# Patient Record
Sex: Female | Born: 1935 | Race: White | Hispanic: No | Marital: Married | Smoking: Never smoker
Health system: Southern US, Community
[De-identification: ages and names within clinical notes are randomized; demographics above are authoritative.]

## PROBLEM LIST (undated history)

## (undated) DIAGNOSIS — R011 Cardiac murmur, unspecified: Secondary | ICD-10-CM

## (undated) DIAGNOSIS — K219 Gastro-esophageal reflux disease without esophagitis: Secondary | ICD-10-CM

## (undated) DIAGNOSIS — M1990 Unspecified osteoarthritis, unspecified site: Secondary | ICD-10-CM

## (undated) DIAGNOSIS — I10 Essential (primary) hypertension: Secondary | ICD-10-CM

## (undated) DIAGNOSIS — K8020 Calculus of gallbladder without cholecystitis without obstruction: Secondary | ICD-10-CM

## (undated) DIAGNOSIS — E785 Hyperlipidemia, unspecified: Secondary | ICD-10-CM

## (undated) DIAGNOSIS — E119 Type 2 diabetes mellitus without complications: Secondary | ICD-10-CM

## (undated) DIAGNOSIS — D509 Iron deficiency anemia, unspecified: Secondary | ICD-10-CM

---

## 1999-07-18 ENCOUNTER — Other Ambulatory Visit: Admission: RE | Admit: 1999-07-18 | Discharge: 1999-07-18 | Payer: Self-pay | Admitting: *Deleted

## 2002-04-24 ENCOUNTER — Ambulatory Visit (HOSPITAL_COMMUNITY): Admission: RE | Admit: 2002-04-24 | Discharge: 2002-04-24 | Payer: Self-pay | Admitting: Gastroenterology

## 2003-01-28 ENCOUNTER — Encounter: Admission: RE | Admit: 2003-01-28 | Discharge: 2003-01-28 | Payer: Self-pay | Admitting: Family Medicine

## 2003-02-05 ENCOUNTER — Encounter: Admission: RE | Admit: 2003-02-05 | Discharge: 2003-02-05 | Payer: Self-pay | Admitting: Family Medicine

## 2003-09-22 ENCOUNTER — Encounter: Admission: RE | Admit: 2003-09-22 | Discharge: 2003-09-22 | Payer: Self-pay | Admitting: Family Medicine

## 2007-08-19 ENCOUNTER — Encounter: Admission: RE | Admit: 2007-08-19 | Discharge: 2007-08-19 | Payer: Self-pay | Admitting: Family Medicine

## 2008-06-09 ENCOUNTER — Encounter: Admission: RE | Admit: 2008-06-09 | Discharge: 2008-06-09 | Payer: Self-pay | Admitting: Family Medicine

## 2011-03-02 DIAGNOSIS — M810 Age-related osteoporosis without current pathological fracture: Secondary | ICD-10-CM | POA: Diagnosis not present

## 2011-05-02 DIAGNOSIS — Z1231 Encounter for screening mammogram for malignant neoplasm of breast: Secondary | ICD-10-CM | POA: Diagnosis not present

## 2011-05-08 DIAGNOSIS — E7800 Pure hypercholesterolemia, unspecified: Secondary | ICD-10-CM | POA: Diagnosis not present

## 2011-08-10 DIAGNOSIS — R011 Cardiac murmur, unspecified: Secondary | ICD-10-CM | POA: Diagnosis not present

## 2011-08-10 DIAGNOSIS — R809 Proteinuria, unspecified: Secondary | ICD-10-CM | POA: Diagnosis not present

## 2011-08-10 DIAGNOSIS — N318 Other neuromuscular dysfunction of bladder: Secondary | ICD-10-CM | POA: Diagnosis not present

## 2011-08-10 DIAGNOSIS — E1129 Type 2 diabetes mellitus with other diabetic kidney complication: Secondary | ICD-10-CM | POA: Diagnosis not present

## 2011-08-10 DIAGNOSIS — E782 Mixed hyperlipidemia: Secondary | ICD-10-CM | POA: Diagnosis not present

## 2011-08-10 DIAGNOSIS — I10 Essential (primary) hypertension: Secondary | ICD-10-CM | POA: Diagnosis not present

## 2011-08-10 DIAGNOSIS — N390 Urinary tract infection, site not specified: Secondary | ICD-10-CM | POA: Diagnosis not present

## 2011-12-15 DIAGNOSIS — Z23 Encounter for immunization: Secondary | ICD-10-CM | POA: Diagnosis not present

## 2012-02-13 DIAGNOSIS — I10 Essential (primary) hypertension: Secondary | ICD-10-CM | POA: Diagnosis not present

## 2012-02-13 DIAGNOSIS — R413 Other amnesia: Secondary | ICD-10-CM | POA: Diagnosis not present

## 2012-02-13 DIAGNOSIS — Z9181 History of falling: Secondary | ICD-10-CM | POA: Diagnosis not present

## 2012-02-19 ENCOUNTER — Other Ambulatory Visit: Payer: Self-pay | Admitting: Family Medicine

## 2012-02-19 DIAGNOSIS — R1013 Epigastric pain: Secondary | ICD-10-CM

## 2012-02-19 DIAGNOSIS — R110 Nausea: Secondary | ICD-10-CM

## 2012-02-20 ENCOUNTER — Ambulatory Visit
Admission: RE | Admit: 2012-02-20 | Discharge: 2012-02-20 | Disposition: A | Discharge status: Home / Self Care | Payer: Medicare Other | Source: Ambulatory Visit | Attending: Family Medicine | Admitting: Family Medicine

## 2012-02-20 DIAGNOSIS — R110 Nausea: Secondary | ICD-10-CM

## 2012-02-20 DIAGNOSIS — K8020 Calculus of gallbladder without cholecystitis without obstruction: Secondary | ICD-10-CM | POA: Diagnosis not present

## 2012-02-20 DIAGNOSIS — R1013 Epigastric pain: Secondary | ICD-10-CM

## 2012-02-24 ENCOUNTER — Encounter (HOSPITAL_BASED_OUTPATIENT_CLINIC_OR_DEPARTMENT_OTHER): Payer: Self-pay | Admitting: *Deleted

## 2012-02-24 ENCOUNTER — Emergency Department (HOSPITAL_BASED_OUTPATIENT_CLINIC_OR_DEPARTMENT_OTHER)
Admission: EM | Admit: 2012-02-24 | Discharge: 2012-02-25 | Disposition: A | Discharge status: Home / Self Care | Payer: Medicare Other | Attending: Emergency Medicine | Admitting: Emergency Medicine

## 2012-02-24 DIAGNOSIS — R110 Nausea: Secondary | ICD-10-CM | POA: Diagnosis not present

## 2012-02-24 DIAGNOSIS — R011 Cardiac murmur, unspecified: Secondary | ICD-10-CM | POA: Insufficient documentation

## 2012-02-24 DIAGNOSIS — N390 Urinary tract infection, site not specified: Secondary | ICD-10-CM | POA: Insufficient documentation

## 2012-02-24 DIAGNOSIS — R109 Unspecified abdominal pain: Secondary | ICD-10-CM | POA: Diagnosis not present

## 2012-02-24 DIAGNOSIS — I10 Essential (primary) hypertension: Secondary | ICD-10-CM | POA: Diagnosis not present

## 2012-02-24 DIAGNOSIS — E119 Type 2 diabetes mellitus without complications: Secondary | ICD-10-CM | POA: Diagnosis not present

## 2012-02-24 DIAGNOSIS — Z8719 Personal history of other diseases of the digestive system: Secondary | ICD-10-CM | POA: Insufficient documentation

## 2012-02-24 DIAGNOSIS — Z8739 Personal history of other diseases of the musculoskeletal system and connective tissue: Secondary | ICD-10-CM | POA: Diagnosis not present

## 2012-02-24 DIAGNOSIS — K859 Acute pancreatitis without necrosis or infection, unspecified: Secondary | ICD-10-CM | POA: Diagnosis not present

## 2012-02-24 HISTORY — DX: Essential (primary) hypertension: I10

## 2012-02-24 HISTORY — DX: Calculus of gallbladder without cholecystitis without obstruction: K80.20

## 2012-02-24 HISTORY — DX: Unspecified osteoarthritis, unspecified site: M19.90

## 2012-02-24 HISTORY — DX: Type 2 diabetes mellitus without complications: E11.9

## 2012-02-24 HISTORY — DX: Cardiac murmur, unspecified: R01.1

## 2012-02-24 LAB — LIPASE, BLOOD: Lipase: 65 U/L — ABNORMAL HIGH (ref 11–59)

## 2012-02-24 LAB — COMPREHENSIVE METABOLIC PANEL
ALT: 28 U/L (ref 0–35)
AST: 22 U/L (ref 0–37)
Albumin: 3.6 g/dL (ref 3.5–5.2)
Alkaline Phosphatase: 67 U/L (ref 39–117)
BUN: 18 mg/dL (ref 6–23)
CO2: 27 mEq/L (ref 19–32)
Calcium: 9.3 mg/dL (ref 8.4–10.5)
Chloride: 102 mEq/L (ref 96–112)
Creatinine, Ser: 1.1 mg/dL (ref 0.50–1.10)
GFR calc Af Amer: 51 mL/min — ABNORMAL LOW (ref >90)
GFR calc non Af Amer: 44 mL/min — ABNORMAL LOW (ref >90)
Glucose, Bld: 91 mg/dL (ref 70–99)
Potassium: 3.1 mEq/L — ABNORMAL LOW (ref 3.5–5.1)
Sodium: 141 mEq/L (ref 135–145)
Total Bilirubin: 0.2 mg/dL — ABNORMAL LOW (ref 0.3–1.2)
Total Protein: 7.1 g/dL (ref 6.0–8.3)

## 2012-02-24 LAB — CBC
HCT: 37.9 % (ref 36.0–46.0)
Hemoglobin: 12.8 g/dL (ref 12.0–15.0)
MCH: 28.4 pg (ref 26.0–34.0)
MCHC: 33.8 g/dL (ref 30.0–36.0)
MCV: 84 fL (ref 78.0–100.0)
Platelets: 324 10*3/uL (ref 150–400)
RBC: 4.51 MIL/uL (ref 3.87–5.11)
RDW: 14.3 % (ref 11.5–15.5)
WBC: 8.9 10*3/uL (ref 4.0–10.5)

## 2012-02-24 MED ORDER — FENTANYL CITRATE 0.05 MG/ML IJ SOLN
50.0000 ug | Freq: Once | INTRAMUSCULAR | Status: AC
  Administered 2012-02-24: 50 ug via INTRAVENOUS
  Filled 2012-02-24: qty 2

## 2012-02-24 MED ORDER — SODIUM CHLORIDE 0.9 % IV SOLN
Freq: Once | INTRAVENOUS | Status: AC
  Administered 2012-02-24: 1000 mL via INTRAVENOUS

## 2012-02-24 MED ORDER — ONDANSETRON HCL 4 MG/2ML IJ SOLN
4.0000 mg | Freq: Once | INTRAMUSCULAR | Status: AC
  Administered 2012-02-24: 4 mg via INTRAVENOUS
  Filled 2012-02-24: qty 2

## 2012-02-24 NOTE — ED Notes (Signed)
Pain onset Sunday. Saw Dr. on Monday. Ultrasound on Tuesday. Dx'd with gallstones. Given 2 injections and hydrocodone which helps. Not eating, still having pain.

## 2012-02-24 NOTE — ED Provider Notes (Addendum)
History   This chart was scribed for Nicole Bianca Smitty Cords, MD scribed by Magnus Sinning. The patient was seen in room MH03/MH03 at 23:04   CSN: [id number]  Arrival date & time 02/24/12  2051     Chief Complaint  Patient presents with  . Cholelithiasis    (Consider location/radiation/quality/duration/timing/severity/associated sxs/prior treatment) Patient is a 78 y.o. female presenting with abdominal pain. The history is provided by the patient and a relative. No language interpreter was used.  Abdominal Pain The primary symptoms of the illness include abdominal pain and nausea. The primary symptoms of the illness do not include fever, fatigue, shortness of breath, vomiting or dysuria. The current episode started more than 2 days ago. The onset of the illness was gradual. The problem has not changed since onset. The abdominal pain began more than 2 days ago. The pain came on gradually. The abdominal pain has been unchanged since its onset. The abdominal pain is generalized. The abdominal pain does not radiate. Pain scale: severe.  The patient has not had a change in bowel habit. Risk factors for an acute abdominal problem include being elderly. Symptoms associated with the illness do not include chills or constipation.   Nicole Villa is a 77 y.o. female who presents to the Emergency Department complaining of constant entire abd cavity pain, which family suspects is related to cholelithiasis, episode approximately two weeks ago, but started back one week ago.  The pt had an ultrasound done 6 days ago at [facility], ordered by PCP. Relative states they were informed the pt had gall stones. Relative notes pt, prior to visit at PCP office, has had abd pain and mild nausea.  The pt had been taking hydrocodone and she was not scheduled to meet with surgeon for gall stone removal due to age-related risks and she is not a surgical candidate.   The pt denies urinary sxs, cough, ST,  N/V.  Past Medical History  Diagnosis Date  . Gallstones   . Diabetes mellitus without complication   . Hypertension   . Arthritis   . Heart murmur     History reviewed. No pertinent past surgical history.  History reviewed. No pertinent family history.  History  Substance Use Topics  . Smoking status: Never Smoker   . Smokeless tobacco: Not on file  . Alcohol Use: No    Review of Systems  Constitutional: Positive for activity change. Negative for fever, chills and fatigue.  Respiratory: Negative for shortness of breath.   Cardiovascular: Negative for chest pain.  Gastrointestinal: Positive for nausea and abdominal pain. Negative for vomiting and constipation.  Genitourinary: Negative.  Negative for dysuria.  All other systems reviewed and are negative.     Allergies  Review of patient's allergies indicates no known allergies.  Home Medications  No current outpatient prescriptions on file.  BP 139/65  Pulse 85  Temp 98.9 F (37.2 C) (Oral)  Resp 18  Ht 4\' 11"  (1.499 m)  Wt 127 lb (57.607 kg)  BMI 25.65 kg/m2  SpO2 98%  Physical Exam  Nursing note and vitals reviewed. Constitutional: She is oriented to person, place, and time. She appears well-developed and well-nourished. No distress.  HENT:  Head: Normocephalic and atraumatic.  Mouth/Throat: Oropharynx is clear and moist. No oropharyngeal exudate.  Eyes: Conjunctivae normal and EOM are normal. Pupils are equal, round, and reactive to light.  Neck: Normal range of motion. Neck supple. No tracheal deviation present.  Cardiovascular: Normal rate, regular rhythm and normal  heart sounds.   Pulmonary/Chest: Effort normal. No respiratory distress. She has no wheezes. She has no rales.       Lungs clear  Abdominal: Soft. Bowel sounds are normal. She exhibits no distension and no mass. There is no tenderness. There is no rebound and no guarding.       Hyperactive bowel sounds  Musculoskeletal: Normal range of  motion.  Neurological: She is alert and oriented to person, place, and time. No sensory deficit.  Skin: Skin is warm and dry.  Psychiatric: She has a normal mood and affect. Her behavior is normal.    ED Course  Procedures (including critical care time) DIAGNOSTIC STUDIES: Oxygen Saturation is 98% on room air, normal by my interpretation.    COORDINATION OF CARE: 23:06: Physical exam performed  Labs Reviewed  COMPREHENSIVE METABOLIC PANEL - Abnormal; Notable for the following:    Potassium 3.1 (*)     Total Bilirubin 0.2 (*)     GFR calc non Af Amer 44 (*)     GFR calc Af Amer 51 (*)     All other components within normal limits  LIPASE, BLOOD - Abnormal; Notable for the following:    Lipase 65 (*)     All other components within normal limits  CBC  URINALYSIS, ROUTINE W REFLEX MICROSCOPIC   No results found.   No diagnosis found.    MDM  Abdominal pain, not localized to RUQ. No acute findings on CT scan.  Will treat for UTI and mild pancreatitis.  Follow up for recheck on Monday.  Return for fevers, intractable vomiting pain or any concerns.  All verbalize understanding and agree to follow up.  Neither patient nor family want admission and prefer to go home and are comfortable with plan.       I personally performed the services described in this documentation, which was scribed in my presence. The recorded information has been reviewed and is accurate.         Nicole Awe, MD 02/25/12 0250  Nicole Ishmael K Malissie Musgrave-Rasch, MD 02/25/12 [phone]

## 2012-02-25 ENCOUNTER — Emergency Department (HOSPITAL_BASED_OUTPATIENT_CLINIC_OR_DEPARTMENT_OTHER): Payer: Medicare Other

## 2012-02-25 DIAGNOSIS — R110 Nausea: Secondary | ICD-10-CM | POA: Diagnosis not present

## 2012-02-25 DIAGNOSIS — R109 Unspecified abdominal pain: Secondary | ICD-10-CM | POA: Diagnosis not present

## 2012-02-25 LAB — URINALYSIS, ROUTINE W REFLEX MICROSCOPIC
Glucose, UA: NEGATIVE mg/dL
Hgb urine dipstick: NEGATIVE
Ketones, ur: NEGATIVE mg/dL
Nitrite: NEGATIVE
Protein, ur: NEGATIVE mg/dL
Specific Gravity, Urine: 1.017 (ref 1.005–1.030)
Urobilinogen, UA: 0.2 mg/dL (ref 0.0–1.0)
pH: 5.5 (ref 5.0–8.0)

## 2012-02-25 LAB — URINE MICROSCOPIC-ADD ON

## 2012-02-25 MED ORDER — OXYCODONE-ACETAMINOPHEN 5-325 MG PO TABS: 1.0000 | ORAL_TABLET | Freq: Four times a day (QID) | ORAL | Status: DC | PRN

## 2012-02-25 MED ORDER — IOHEXOL 300 MG/ML  SOLN
100.0000 mL | Freq: Once | INTRAMUSCULAR | Status: AC | PRN
  Administered 2012-02-25: 100 mL via INTRAVENOUS

## 2012-02-25 MED ORDER — IOHEXOL 300 MG/ML  SOLN
50.0000 mL | Freq: Once | INTRAMUSCULAR | Status: AC | PRN
  Administered 2012-02-25: 50 mL via ORAL

## 2012-02-25 MED ORDER — CIPROFLOXACIN HCL 500 MG PO TABS: 500.0000 mg | ORAL_TABLET | Freq: Two times a day (BID) | ORAL | Status: DC

## 2012-02-25 NOTE — ED Notes (Signed)
Patient transported to CT 

## 2012-02-26 LAB — URINE CULTURE: Colony Count: 40000

## 2012-03-07 DIAGNOSIS — K859 Acute pancreatitis without necrosis or infection, unspecified: Secondary | ICD-10-CM | POA: Diagnosis not present

## 2012-03-07 DIAGNOSIS — R6889 Other general symptoms and signs: Secondary | ICD-10-CM | POA: Diagnosis not present

## 2012-03-07 DIAGNOSIS — R413 Other amnesia: Secondary | ICD-10-CM | POA: Diagnosis not present

## 2012-03-07 DIAGNOSIS — R5381 Other malaise: Secondary | ICD-10-CM | POA: Diagnosis not present

## 2012-05-02 DIAGNOSIS — I10 Essential (primary) hypertension: Secondary | ICD-10-CM | POA: Diagnosis not present

## 2012-05-02 DIAGNOSIS — Z0000 Encounter for general adult medical examination without abnormal findings: Secondary | ICD-10-CM | POA: Diagnosis not present

## 2012-05-07 ENCOUNTER — Observation Stay (HOSPITAL_COMMUNITY): Payer: Medicare Other

## 2012-05-07 ENCOUNTER — Encounter (HOSPITAL_COMMUNITY): Payer: Self-pay

## 2012-05-07 ENCOUNTER — Emergency Department (HOSPITAL_COMMUNITY): Payer: Medicare Other

## 2012-05-07 ENCOUNTER — Observation Stay (HOSPITAL_COMMUNITY)
Admission: EM | Admit: 2012-05-07 | Discharge: 2012-05-09 | Disposition: A | Discharge status: Home / Self Care | Payer: Medicare Other | Attending: Internal Medicine | Admitting: Internal Medicine

## 2012-05-07 DIAGNOSIS — R011 Cardiac murmur, unspecified: Secondary | ICD-10-CM | POA: Diagnosis not present

## 2012-05-07 DIAGNOSIS — G459 Transient cerebral ischemic attack, unspecified: Principal | ICD-10-CM | POA: Insufficient documentation

## 2012-05-07 DIAGNOSIS — D649 Anemia, unspecified: Secondary | ICD-10-CM | POA: Diagnosis not present

## 2012-05-07 DIAGNOSIS — I10 Essential (primary) hypertension: Secondary | ICD-10-CM | POA: Diagnosis not present

## 2012-05-07 DIAGNOSIS — M129 Arthropathy, unspecified: Secondary | ICD-10-CM | POA: Diagnosis not present

## 2012-05-07 DIAGNOSIS — D509 Iron deficiency anemia, unspecified: Secondary | ICD-10-CM

## 2012-05-07 DIAGNOSIS — R4789 Other speech disturbances: Secondary | ICD-10-CM | POA: Insufficient documentation

## 2012-05-07 DIAGNOSIS — E119 Type 2 diabetes mellitus without complications: Secondary | ICD-10-CM | POA: Diagnosis not present

## 2012-05-07 DIAGNOSIS — N179 Acute kidney failure, unspecified: Secondary | ICD-10-CM | POA: Insufficient documentation

## 2012-05-07 DIAGNOSIS — R0602 Shortness of breath: Secondary | ICD-10-CM | POA: Diagnosis not present

## 2012-05-07 DIAGNOSIS — E785 Hyperlipidemia, unspecified: Secondary | ICD-10-CM | POA: Diagnosis not present

## 2012-05-07 HISTORY — DX: Gastro-esophageal reflux disease without esophagitis: K21.9

## 2012-05-07 HISTORY — DX: Iron deficiency anemia, unspecified: D50.9

## 2012-05-07 HISTORY — DX: Hyperlipidemia, unspecified: E78.5

## 2012-05-07 LAB — GLUCOSE, CAPILLARY

## 2012-05-07 LAB — COMPREHENSIVE METABOLIC PANEL
Albumin: 3.3 g/dL — ABNORMAL LOW (ref 3.5–5.2)
BUN: 30 mg/dL — ABNORMAL HIGH (ref 6–23)
Creatinine, Ser: 1.21 mg/dL — ABNORMAL HIGH (ref 0.50–1.10)
Total Protein: 6.6 g/dL (ref 6.0–8.3)

## 2012-05-07 LAB — DIFFERENTIAL
Basophils Relative: 0 % (ref 0–1)
Eosinophils Absolute: 0.3 10*3/uL (ref 0.0–0.7)
Monocytes Absolute: 0.8 10*3/uL (ref 0.1–1.0)
Monocytes Relative: 10 % (ref 3–12)
Neutrophils Relative %: 63 % (ref 43–77)

## 2012-05-07 LAB — CBC
HCT: 32.1 % — ABNORMAL LOW (ref 36.0–46.0)
HCT: 34.5 % — ABNORMAL LOW (ref 36.0–46.0)
Hemoglobin: 10.6 g/dL — ABNORMAL LOW (ref 12.0–15.0)
MCH: 27.7 pg (ref 26.0–34.0)
MCHC: 33 g/dL (ref 30.0–36.0)
MCV: 83.5 fL (ref 78.0–100.0)
RDW: 15.1 % (ref 11.5–15.5)

## 2012-05-07 LAB — PROTIME-INR
INR: 0.94 (ref 0.00–1.49)
Prothrombin Time: 12.5 seconds (ref 11.6–15.2)

## 2012-05-07 LAB — POCT I-STAT TROPONIN I: Troponin i, poc: 0 ng/mL (ref 0.00–0.08)

## 2012-05-07 LAB — CREATININE, SERUM
GFR calc Af Amer: 54 mL/min — ABNORMAL LOW (ref >90)
GFR calc non Af Amer: 46 mL/min — ABNORMAL LOW (ref >90)

## 2012-05-07 LAB — TROPONIN I: Troponin I: <0.3 ng/mL (ref <0.30)

## 2012-05-07 MED ORDER — ONDANSETRON HCL 4 MG/2ML IJ SOLN: 4.0000 mg | Freq: Four times a day (QID) | INTRAMUSCULAR | Status: DC | PRN

## 2012-05-07 MED ORDER — OMEGA-3 FATTY ACIDS 1000 MG PO CAPS: 1.0000 g | ORAL_CAPSULE | Freq: Every day | ORAL | Status: DC

## 2012-05-07 MED ORDER — SENNOSIDES-DOCUSATE SODIUM 8.6-50 MG PO TABS
1.0000 | ORAL_TABLET | Freq: Every evening | ORAL | Status: DC | PRN
  Filled 2012-05-07: qty 1

## 2012-05-07 MED ORDER — SODIUM CHLORIDE 0.9 % IV SOLN
INTRAVENOUS | Status: DC
  Administered 2012-05-07: 1000 mL via INTRAVENOUS

## 2012-05-07 MED ORDER — EZETIMIBE 10 MG PO TABS
10.0000 mg | ORAL_TABLET | Freq: Every day | ORAL | Status: DC
  Administered 2012-05-07 – 2012-05-09 (×3): 10 mg via ORAL
  Filled 2012-05-07 (×3): qty 1

## 2012-05-07 MED ORDER — SIMVASTATIN 20 MG PO TABS
20.0000 mg | ORAL_TABLET | Freq: Every day | ORAL | Status: DC
  Administered 2012-05-08: 20 mg via ORAL
  Filled 2012-05-07 (×2): qty 1

## 2012-05-07 MED ORDER — ASPIRIN 325 MG PO TABS
325.0000 mg | ORAL_TABLET | Freq: Every day | ORAL | Status: DC
  Administered 2012-05-07: 325 mg via ORAL
  Filled 2012-05-07 (×2): qty 1

## 2012-05-07 MED ORDER — ENOXAPARIN SODIUM 40 MG/0.4ML ~~LOC~~ SOLN
40.0000 mg | SUBCUTANEOUS | Status: DC
  Administered 2012-05-07: 40 mg via SUBCUTANEOUS
  Filled 2012-05-07 (×3): qty 0.4

## 2012-05-07 MED ORDER — PANTOPRAZOLE SODIUM 40 MG PO TBEC
40.0000 mg | DELAYED_RELEASE_TABLET | Freq: Every day | ORAL | Status: DC
  Administered 2012-05-08 – 2012-05-09 (×2): 40 mg via ORAL
  Filled 2012-05-07: qty 1

## 2012-05-07 MED ORDER — ASPIRIN 300 MG RE SUPP
300.0000 mg | Freq: Every day | RECTAL | Status: DC
  Filled 2012-05-07 (×2): qty 1

## 2012-05-07 MED ORDER — COLESEVELAM HCL 3.75 G PO PACK
1.0000 | PACK | Freq: Every day | ORAL | Status: DC
  Administered 2012-05-08 – 2012-05-09 (×2): 1 via ORAL
  Filled 2012-05-07 (×3): qty 1

## 2012-05-07 MED ORDER — COLESEVELAM HCL 3.75 G PO PACK
1.0000 | PACK | Freq: Every day | ORAL | Status: DC
  Filled 2012-05-07: qty 1

## 2012-05-07 MED ORDER — CLOPIDOGREL BISULFATE 75 MG PO TABS
75.0000 mg | ORAL_TABLET | Freq: Once | ORAL | Status: AC
  Administered 2012-05-07: 75 mg via ORAL
  Filled 2012-05-07: qty 1

## 2012-05-07 MED ORDER — INSULIN ASPART 100 UNIT/ML ~~LOC~~ SOLN: 0.0000 [IU] | Freq: Every day | SUBCUTANEOUS | Status: DC

## 2012-05-07 MED ORDER — INSULIN ASPART 100 UNIT/ML ~~LOC~~ SOLN: 0.0000 [IU] | Freq: Three times a day (TID) | SUBCUTANEOUS | Status: DC

## 2012-05-07 MED ORDER — ONDANSETRON HCL 4 MG/2ML IJ SOLN: 4.0000 mg | Freq: Three times a day (TID) | INTRAMUSCULAR | Status: AC | PRN

## 2012-05-07 MED ORDER — OMEGA-3-ACID ETHYL ESTERS 1 G PO CAPS
1.0000 g | ORAL_CAPSULE | Freq: Every day | ORAL | Status: DC
  Administered 2012-05-08 – 2012-05-09 (×2): 1 g via ORAL
  Filled 2012-05-07 (×2): qty 1

## 2012-05-07 NOTE — ED Notes (Signed)
RN Katie requested we stop by X-ray before taking pt upstairs.

## 2012-05-07 NOTE — H&P (Signed)
[facility] History and Physical  Nicole Villa NFA:[id number] DOB: 1923/02/10 DOA: 05/07/2012   PCP: No primary provider on file.    Chief Complaint: slurred speech and confusion at 1 pm this afternoon.   HPI: Nicole Villa is a 79 y.o. female with h/o dm, hypertension, was brought by her husband and daughter for slurred speech and confusion occurred this afternoon at optholmology office . She doesn't remember any of the confusion or speech. She remembers going to the eye doctor and then being brought to ED. She denies any other complaints. Initial CT neg for bleed or stroke, she is being admitted for evaluation of CVA.   Review of Systems: The patient denies anorexia, fever, weight loss,, vision loss, decreased hearing, hoarseness, chest pain, syncope, dyspnea on exertion, peripheral edema, balance deficits, hemoptysis, abdominal pain, melena, hematochezia, severe indigestion/heartburn, hematuria, incontinence, genital sores, muscle weakness, suspicious skin lesions, transient blindness, difficulty walking, depression, unusual weight change, abnormal bleeding, enlarged lymph nodes, angioedema, and breast masses.    Past Medical History  Diagnosis Date  . Gallstones   . Diabetes mellitus without complication   . Hypertension   . Arthritis   . Heart murmur   . GERD (gastroesophageal reflux disease)   . Hyperlipemia    History reviewed. No pertinent past surgical history. Social History:  reports that she has never smoked. She does not have any smokeless tobacco history on file. She reports that she does not drink alcohol or use illicit drugs.  where does patient live--home, Allergies  Allergen Reactions  . Lisinopril Cough    History reviewed. No pertinent family history.   Prior to Admission medications   Medication Sig Start Date End Date Taking? Authorizing Provider  alendronate (FOSAMAX) 70 MG tablet Take 70 mg by mouth every 7 (seven) days. Take with a full  glass of water on an empty stomach.   Yes Historical Provider, MD  amLODipine (NORVASC) 10 MG tablet Take 10 mg by mouth daily.   Yes Historical Provider, MD  beta carotene w/minerals (OCUVITE) tablet Take 1 tablet by mouth daily.   Yes Historical Provider, MD  cholecalciferol (VITAMIN D) 1000 UNITS tablet Take 1,000 Units by mouth daily.   Yes Historical Provider, MD  Colesevelam HCl [facility]) 3.75 G PACK Take 1 packet by mouth daily with breakfast.   Yes Historical Provider, MD  ezetimibe (ZETIA) 10 MG tablet Take 10 mg by mouth daily.   Yes Historical Provider, MD  fish oil-omega-3 fatty acids 1000 MG capsule Take 1 g by mouth daily.   Yes Historical Provider, MD  glimepiride (AMARYL) 1 MG tablet Take 0.5 mg by mouth 2 (two) times daily.   Yes Historical Provider, MD  mirabegron ER (MYRBETRIQ) 25 MG TB24 Take 25 mg by mouth daily.   Yes Historical Provider, MD  nabumetone (RELAFEN) 500 MG tablet Take 500 mg by mouth daily.   Yes Historical Provider, MD  pantoprazole (PROTONIX) 40 MG tablet Take 40 mg by mouth daily.   Yes Historical Provider, MD  pravastatin (PRAVACHOL) 20 MG tablet Take 20 mg by mouth daily.   Yes Historical Provider, MD  valsartan-hydrochlorothiazide (DIOVAN-HCT) 320-12.5 MG per tablet Take 1 tablet by mouth daily.   Yes Historical Provider, MD   Physical Exam: Filed Vitals:   05/07/12 1427  BP: 120/52  Pulse: 77  Temp: 97.3 F (36.3 C)  TempSrc: Oral  SpO2: 96%    Constitutional: Vital signs reviewed.  Patient is a well-developed and well-nourished  in no  acute distress and cooperative with exam. Alert and oriented x3.  Head: Normocephalic and atraumatic Mouth: no erythema or exudates, MMM Eyes: PERRL, EOMI, conjunctivae normal, No scleral icterus.  Neck: Supple, Trachea midline normal ROM, No JVD, mass, thyromegaly, or carotid bruit present.  Cardiovascular: RRR, S1 normal, S2 normal, no MRG, pulses symmetric and intact bilaterally Pulmonary/Chest: CTAB, no  wheezes, rales, or rhonchi Abdominal: Soft. Non-tender, non-distended, bowel sounds are normal, no masses, organomegaly, or guarding present.  Musculoskeletal: No joint deformities, erythema, or stiffness, ROM full and no nontender Neurological: A&O x3, Strength is normal and symmetric bilaterally, cranial nerve II-XII are grossly intact, no focal motor deficit, sensory intact to light touch bilaterally.  Skin: Warm, dry and intact. No rash, cyanosis, or clubbing.  Psychiatric: Normal mood and affect. speech and behavior is normal.   Labs on Admission:  Basic Metabolic Panel:  Recent Labs Lab 05/07/12 1419  NA 140  K 3.6  CL 102  CO2 27  GLUCOSE 138*  BUN 30*  CREATININE 1.21*  CALCIUM 8.9   Liver Function Tests:  Recent Labs Lab 05/07/12 1419  AST 22  ALT 25  ALKPHOS 65  BILITOT 0.2*  PROT 6.6  ALBUMIN 3.3*   No results found for this basename: LIPASE, AMYLASE,  in the last 168 hours No results found for this basename: AMMONIA,  in the last 168 hours CBC:  Recent Labs Lab 05/07/12 1419  WBC 8.2  NEUTROABS 5.2  HGB 10.6*  HCT 32.1*  MCV 84.0  PLT 287   Cardiac Enzymes:  Recent Labs Lab 05/07/12 1419  TROPONINI <0.30    BNP (last 3 results) No results found for this basename: PROBNP,  in the last 8760 hours CBG: No results found for this basename: GLUCAP,  in the last 168 hours  Radiological Exams on Admission: Ct Head Wo Contrast  05/07/2012  *RADIOLOGY REPORT*  Clinical Data: Code stroke, confusion, slurred speech, aphasia  CT HEAD WITHOUT CONTRAST  Technique:  Contiguous axial images were obtained from the base of the skull through the vertex without contrast.  Comparison: No similar prior study is available for comparison.  Findings: Periventricular white matter hypodensity is most compatible with small vessel ischemic change.  Remote external capsule lacunar infarcts are noted.  No midline shift. No acute hemorrhage, acute infarction, or mass lesion  is seen.  Diffuse cortical volume loss noted with proportional ventricular prominence.  IMPRESSION: No acute intracranial finding.  Chronic findings as above.   Original Report Authenticated By: Christiana Pellant, M.D.     EKG: NSR   Assessment/Plan 1. TIA:  - Stroke work up in progress  - MRI/MRA head and neck - echo - carotid duplex - hgba1c, and lipid panel ordered.  - aspirin 325 mg given.  - neuro consult called by ED.  - pt/ot /slp eval ordered.   2. DM: CBG (last 3)  No results found for this basename: GLUCAP,  in the last 72 hours  SSI.  3. Hypertension: allow permissive hypertension.   4. Acute renal failure: possibly from dehydration. FLUIDS ordered.   5. Anemia: normocytic. Anemia panel ordered.   DVT prophylaxis   Code Status: full code Family Communication: family at bedside, spouse and daughter Disposition Plan: 1 to 2 days.   Time spent: 55  min  Nicole Villa,Nicole Villa [facility] Pager [phone]  If 7PM-7AM, please contact night-coverage [email or web address] Password [facility] 05/07/2012, 5:28 PM

## 2012-05-07 NOTE — ED Notes (Signed)
Pt presents to ED with slurred speech and confusion around 1330. Symptoms lasted approx 10 minutes. Pts daughter states pt is back at baseline.

## 2012-05-07 NOTE — ED Provider Notes (Signed)
History     CSN: [id number]  Arrival date & time 05/07/12  1407   First MD Initiated Contact with Patient 05/07/12 1416      Chief Complaint  Patient presents with  . Altered Mental Status    (Consider location/radiation/quality/duration/timing/severity/associated sxs/prior treatment) HPI Comments: 79 year old female with a history of hypertension, diabetes, heart murmur and hyperlipidemia who presents to the hospital with her family members by paramedic transport secondary to the patient's difficulty speaking. According to the family members they were sitting in the ophthalmologist office waiting for the patient's husband to have an examination when somebody ask her questions and she had responded with garbled speech. This lasted for about 15 minutes, she was visibly frustrated by her inability to speak correctly and then the symptoms resolved. During this time she had no difficulty using her hands, no difficulty swallowing, no respiratory difficulties, she did not walk and was transported by wheelchair from that point forward. The patient denies having any history of stroke and does not smoke cigarettes. The symptoms lasted 15 minutes, resolve spontaneously. She has not had symptoms like this in the past  Patient is a 79 y.o. female presenting with altered mental status. The history is provided by the patient, a relative and the EMS personnel.  Altered Mental Status    Past Medical History  Diagnosis Date  . Gallstones   . Diabetes mellitus without complication   . Hypertension   . Arthritis   . Heart murmur   . GERD (gastroesophageal reflux disease)   . Hyperlipemia     History reviewed. No pertinent past surgical history.  History reviewed. No pertinent family history.  History  Substance Use Topics  . Smoking status: Never Smoker   . Smokeless tobacco: Not on file  . Alcohol Use: No    OB History   Grav Para Term Preterm Abortions TAB SAB Ect Mult Living                   Review of Systems  Psychiatric/Behavioral: Positive for altered mental status.  All other systems reviewed and are negative.    Allergies  Lisinopril  Home Medications   Current Outpatient Rx  Name  Route  Sig  Dispense  Refill  . alendronate (FOSAMAX) 70 MG tablet   Oral   Take 70 mg by mouth every 7 (seven) days. Take with a full glass of water on an empty stomach.         Marland Kitchen amLODipine (NORVASC) 10 MG tablet   Oral   Take 10 mg by mouth daily.         . beta carotene w/minerals (OCUVITE) tablet   Oral   Take 1 tablet by mouth daily.         . cholecalciferol (VITAMIN D) 1000 UNITS tablet   Oral   Take 1,000 Units by mouth daily.         . Colesevelam HCl (WELCHOL) 3.75 G PACK   Oral   Take 1 packet by mouth daily with breakfast.         . ezetimibe (ZETIA) 10 MG tablet   Oral   Take 10 mg by mouth daily.         . fish oil-omega-3 fatty acids 1000 MG capsule   Oral   Take 1 g by mouth daily.         Marland Kitchen glimepiride (AMARYL) 1 MG tablet   Oral   Take 0.5 mg by mouth 2 (two) times  daily.         . mirabegron ER (MYRBETRIQ) 25 MG TB24   Oral   Take 25 mg by mouth daily.         . nabumetone (RELAFEN) 500 MG tablet   Oral   Take 500 mg by mouth daily.         . pantoprazole (PROTONIX) 40 MG tablet   Oral   Take 40 mg by mouth daily.         . pravastatin (PRAVACHOL) 20 MG tablet   Oral   Take 20 mg by mouth daily.         . valsartan-hydrochlorothiazide (DIOVAN-HCT) 320-12.5 MG per tablet   Oral   Take 1 tablet by mouth daily.           BP 120/52  Pulse 77  Temp(Src) 97.3 F (36.3 C) (Oral)  SpO2 96%  Physical Exam  Nursing note and vitals reviewed. Constitutional: She appears well-developed and well-nourished. No distress.  HENT:  Head: Normocephalic and atraumatic.  Mouth/Throat: Oropharynx is clear and moist. No oropharyngeal exudate.  Eyes: Conjunctivae and EOM are normal. Pupils are equal, round, and  reactive to light. Right eye exhibits no discharge. Left eye exhibits no discharge. No scleral icterus.  Neck: Normal range of motion. Neck supple. No JVD present. No thyromegaly present.  Cardiovascular: Normal rate, regular rhythm and intact distal pulses.  Exam reveals no gallop and no friction rub.   Murmur ( Soft systolic murmur) heard. Carotid bruits auscultated bilaterally, soft  Pulmonary/Chest: Effort normal and breath sounds normal. No respiratory distress. She has no wheezes. She has no rales.  Abdominal: Soft. Bowel sounds are normal. She exhibits no distension and no mass. There is no tenderness.  Musculoskeletal: Normal range of motion. She exhibits no edema and no tenderness.  Lymphadenopathy:    She has no cervical adenopathy.  Neurological: She is alert. Coordination normal.  The patient has normal speech, normal memory, normal coordination of all 4 extremities without any limb ataxia. The patient has normal strength of all 4 extremities at the major muscle groups including shoulders, biceps, triceps, forearm, grips, quadriceps, hamstrings, calves. Normal sensation to light touch and pinprick of all 4 extremities and the trunk. No truncal ataxia, normal gait, cranial nerves III through XII are intact.  Normal peripheral visual fields. Normal extraocular movements. Normal pupillary exam. No pronator drift, normal reflexes at the brachial radialis, biceps and patellar tendons. Normal position sense, normal temperature sensation to cold and warm  Skin: Skin is warm and dry. No rash noted. No erythema.  Psychiatric: She has a normal mood and affect. Her behavior is normal.    ED Course  Procedures (including critical care time)  Labs Reviewed  CBC - Abnormal; Notable for the following:    RBC 3.82 (*)    Hemoglobin 10.6 (*)    HCT 32.1 (*)    All other components within normal limits  COMPREHENSIVE METABOLIC PANEL - Abnormal; Notable for the following:    Glucose, Bld 138 (*)     BUN 30 (*)    Creatinine, Ser 1.21 (*)    Albumin 3.3 (*)    Total Bilirubin 0.2 (*)    GFR calc non Af Amer 39 (*)    GFR calc Af Amer 45 (*)    All other components within normal limits  PROTIME-INR  APTT  DIFFERENTIAL  TROPONIN I  POCT I-STAT TROPONIN I   Ct Head Wo Contrast  05/07/2012  *RADIOLOGY  REPORT*  Clinical Data: Code stroke, confusion, slurred speech, aphasia  CT HEAD WITHOUT CONTRAST  Technique:  Contiguous axial images were obtained from the base of the skull through the vertex without contrast.  Comparison: No similar prior study is available for comparison.  Findings: Periventricular white matter hypodensity is most compatible with small vessel ischemic change.  Remote external capsule lacunar infarcts are noted.  No midline shift. No acute hemorrhage, acute infarction, or mass lesion is seen.  Diffuse cortical volume loss noted with proportional ventricular prominence.  IMPRESSION: No acute intracranial finding.  Chronic findings as above.   Original Report Authenticated By: Christiana Pellant, M.D.      1. TIA (transient ischemic attack)       MDM  At this time the patient has a normal neurologic exam though her symptoms were consistent with having an acute stroke. She is afebrile, will need further workup for stroke, likely needs admission as she does have murmur, soft bruits. She is hypertensive, diabetic and hypercholesterolemic.  ED ECG REPORT  I personally interpreted this EKG   Date: 05/07/2012   Rate: 71  Rhythm: normal sinus rhythm  QRS Axis: normal  Intervals: normal  ST/T Wave abnormalities: normal  Conduction Disutrbances:none  Narrative Interpretation: Poor R-wave progression present  Old EKG Reviewed: none available  Pt labs reviewed and are normal - CT head without acute hemorrhage and VS remain in a normal range.  Pt has had no recurrence of her slurred speech.  Dr. Roseanne Reno is evaluating the pt at this time in consultation.  Hospitalist paged for  admission.  Dr. Blake Divine to admit.     Vida Roller, MD 05/07/12 [phone]

## 2012-05-07 NOTE — Consult Note (Signed)
Referring Physician: Dr. Eber Hong    Chief Complaint: Slurred speech and confusion transiently.  HPI: Nicole Villa is an 79 y.o. female Mr. diabetes mellitus, hypertension and hyperlipidemia who experienced acute onset of markedly slurred speech and confusion around 1:30 this afternoon. Patient has no history of stroke nor TIA. She's been on aspirin 81 mg per day. CT scan of the head showed no acute intracranial abnormality. No clear focal facial weakness nor extremity weakness was noted by family members. Patient was back to baseline within about 10 minutes of onset of her symptoms. NIH stroke score emergency room was 0.  LSN: 1:30 PM 05/07/2012 tPA Given: No: Deficits resolved MRankin: 0  Past Medical History  Diagnosis Date  . Gallstones   . Diabetes mellitus without complication   . Hypertension   . Arthritis   . Heart murmur   . GERD (gastroesophageal reflux disease)   . Hyperlipemia     History reviewed. No pertinent family history.   Medications:  Prior to Admission:   Aspirin 81 mg per day Norvasc 10 mg per day Ocuvite one tablet daily Vitamin D 1000 units daily WelChol 3.75 g per day Zetia 10 mg per day Omega-3 fatty acid 1000 mg per day  Amaryl 0.5 mg twice a day Myrbetriq 25 mg per day Relafen 500 mg per day Protonix 40 mg per day Pravachol 20 mg per day Diovan/hydrochlorothiazide 1 per day   Physical Examination: Blood pressure 120/52, pulse 77, temperature 98.4 F (36.9 C), temperature source Oral, SpO2 96.00%.  Neurologic Examination: Mental Status: Alert, oriented, thought content appropriate.  Speech fluent without evidence of aphasia. Able to follow commands without difficulty. Cranial Nerves: II-Visual fields were normal. III/IV/VI-Pupils were equal and reacted. Extraocular movements were full and conjugate.    V/VII-no facial numbness and no facial weakness. VIII-normal. X-normal speech and symmetrical palatal movement. Motor: 5/5  bilaterally with normal tone and bulk Sensory: Normal throughout. Deep Tendon Reflexes: 2+ and symmetric. Plantars: Mute bilaterally Cerebellar: Normal finger-to-nose testing. Carotid auscultation: Normal  Ct Head Wo Contrast  05/07/2012  *RADIOLOGY REPORT*  Clinical Data: Code stroke, confusion, slurred speech, aphasia  CT HEAD WITHOUT CONTRAST  Technique:  Contiguous axial images were obtained from the base of the skull through the vertex without contrast.  Comparison: No similar prior study is available for comparison.  Findings: Periventricular white matter hypodensity is most compatible with small vessel ischemic change.  Remote external capsule lacunar infarcts are noted.  No midline shift. No acute hemorrhage, acute infarction, or mass lesion is seen.  Diffuse cortical volume loss noted with proportional ventricular prominence.  IMPRESSION: No acute intracranial finding.  Chronic findings as above.   Original Report Authenticated By: Christiana Pellant, M.D.     Assessment: 79 y.o. female with diabetes, hypertension and hyperlipidemia presenting with probable transient ischemic attack.  Stroke Risk Factors - diabetes mellitus, hyperlipidemia and hypertension  Plan: 1. HgbA1c, fasting lipid panel 2. MRI, MRA  of the brain without contrast 3. PT consult, OT consult, Speech consult 4. Echocardiogram 5. Carotid dopplers 6. Prophylactic therapy-Antiplatelet med: Plavix - 75 mg per day 7. Risk factor modification 8. Telemetry monitoring   C.R. Roseanne Reno, MD [facility] [phone]  05/07/2012, 5:48 PM

## 2012-05-07 NOTE — ED Notes (Signed)
Neurology at bedside.

## 2012-05-08 ENCOUNTER — Observation Stay (HOSPITAL_COMMUNITY): Payer: Medicare Other

## 2012-05-08 ENCOUNTER — Encounter (HOSPITAL_COMMUNITY): Payer: Self-pay | Admitting: *Deleted

## 2012-05-08 DIAGNOSIS — G459 Transient cerebral ischemic attack, unspecified: Secondary | ICD-10-CM | POA: Diagnosis not present

## 2012-05-08 DIAGNOSIS — N179 Acute kidney failure, unspecified: Secondary | ICD-10-CM | POA: Diagnosis not present

## 2012-05-08 DIAGNOSIS — D649 Anemia, unspecified: Secondary | ICD-10-CM | POA: Diagnosis not present

## 2012-05-08 DIAGNOSIS — E119 Type 2 diabetes mellitus without complications: Secondary | ICD-10-CM | POA: Diagnosis not present

## 2012-05-08 DIAGNOSIS — I517 Cardiomegaly: Secondary | ICD-10-CM | POA: Diagnosis not present

## 2012-05-08 LAB — BASIC METABOLIC PANEL
BUN: 23 mg/dL (ref 6–23)
Chloride: 105 mEq/L (ref 96–112)
GFR calc Af Amer: 67 mL/min — ABNORMAL LOW (ref >90)
GFR calc non Af Amer: 57 mL/min — ABNORMAL LOW (ref >90)
Glucose, Bld: 81 mg/dL (ref 70–99)
Potassium: 3.5 mEq/L (ref 3.5–5.1)
Sodium: 143 mEq/L (ref 135–145)

## 2012-05-08 LAB — RETICULOCYTES
RBC.: 3.83 MIL/uL — ABNORMAL LOW (ref 3.87–5.11)
Retic Count, Absolute: 34.5 10*3/uL (ref 19.0–186.0)

## 2012-05-08 LAB — FOLATE: Folate: 10.8 ng/mL

## 2012-05-08 LAB — GLUCOSE, CAPILLARY
Glucose-Capillary: 113 mg/dL — ABNORMAL HIGH (ref 70–99)
Glucose-Capillary: 183 mg/dL — ABNORMAL HIGH (ref 70–99)

## 2012-05-08 LAB — IRON AND TIBC: UIBC: 281 ug/dL (ref 125–400)

## 2012-05-08 LAB — CBC
HCT: 31.5 % — ABNORMAL LOW (ref 36.0–46.0)
Hemoglobin: 11 g/dL — ABNORMAL LOW (ref 12.0–15.0)
MCHC: 34.9 g/dL (ref 30.0–36.0)
WBC: 7.3 10*3/uL (ref 4.0–10.5)

## 2012-05-08 LAB — FERRITIN: Ferritin: 12 ng/mL (ref 10–291)

## 2012-05-08 LAB — HEMOGLOBIN A1C: Hgb A1c MFr Bld: 6.5 % — ABNORMAL HIGH (ref <5.7)

## 2012-05-08 LAB — LIPID PANEL
Cholesterol: 110 mg/dL (ref 0–200)
Total CHOL/HDL Ratio: 2.8 RATIO
Triglycerides: 103 mg/dL (ref <150)

## 2012-05-08 MED ORDER — STROKE: EARLY STAGES OF RECOVERY BOOK
Freq: Once | Status: AC
  Administered 2012-05-08: 09:00:00
  Filled 2012-05-08: qty 1

## 2012-05-08 MED ORDER — CLOPIDOGREL BISULFATE 75 MG PO TABS
75.0000 mg | ORAL_TABLET | Freq: Every day | ORAL | Status: DC
  Administered 2012-05-08 – 2012-05-09 (×2): 75 mg via ORAL
  Filled 2012-05-08 (×2): qty 1

## 2012-05-08 NOTE — Evaluation (Signed)
Speech Language Pathology Evaluation Patient Details Name: LANELLE LINDO MRN: [id number] DOB: 1923/01/10 Today's Date: 05/08/2012 Time: 4098-1191 SLP Time Calculation (min): 11 min  Problem List:  Patient Active Problem List  Diagnosis  . Hypertension  . Diabetes mellitus  . ARF (acute renal failure)  . Anemia, unspecified  . TIA (transient ischemic attack)   Past Medical History:  Past Medical History  Diagnosis Date  . Gallstones   . Diabetes mellitus without complication   . Hypertension   . Arthritis   . Heart murmur   . GERD (gastroesophageal reflux disease)   . Hyperlipemia    Past Surgical History: History reviewed. No pertinent past surgical history. HPI:  79 yo female adm to [facility] with slurred speech and confusion.  Pt CT head negative, she is scheduled for Echo and Head MRI today.  Speech evaluation ordered due to suspected neuro event.  Daughter reports pt's symptoms have completely resolved.     Assessment / Plan / Recommendation Clinical Impression  Pt presents with baseline cogling function- symptoms completely resolved per daughter, Inetta Fermo.  Inetta Fermo reports mother has mild memory deficits starting in Dec 2013 with gallstone/UTI issue that has not worsened with this event.  Speech was clear without dysarthria, language was fluent with intact syntax, semantics and use.   Per daughter, pt does not cook at home unless her spouse is home and spouse makes sure pt takes her medications.  Inetta Fermo manages placing pt's medication in her pill box and her finances.   Pt will have level of assist needed at home per discussion with daughter and she is at her baseline level.  No further SLP indicated, pt and Inetta Fermo agree.       SLP Assessment  Patient does not need any further Speech Lanaguage Pathology Services    Follow Up Recommendations     n/a  Frequency and Duration        Pertinent Vitals/Pain Afebrile   SLP Goals   n/a eval and dc  SLP Evaluation Prior  Functioning  Cognitive/Linguistic Baseline: Memory deficits at baseline Lives With: Spouse Available Help at Discharge: Family (dtr manages finances and medications) Vocation: Retired- worked at [employer] Status: Impaired at baseline (daughter Inetta Fermo reports baseline cognitive deficits- memory) Arousal/Alertness: Awake/alert Orientation Level:  (stated date was 8th or 9th, reoriented to calendar) Attention: Sustained Sustained Attention: Appears intact Memory:  (did not recall medical event, recalls testing pending today) Awareness: Appears intact (aware or premorbid vision deficits, asking re MRI) Problem Solving: Appears intact (advised slp to use caution d/t cord strewn across room) Safety/Judgment: Appears intact    Comprehension  Auditory Comprehension Overall Auditory Comprehension: Appears within functional limits for tasks assessed Yes/No Questions: Not tested Conversation: Complex Other Conversation Comments: re: conversation about home environment and level of assistance available appeared functional Interfering Components: Hearing;Visual impairments Visual Recognition/Discrimination Discrimination: Not tested Reading Comprehension Reading Status:  (pt read date on calendar)    Expression Expression Primary Mode of Expression: Verbal Verbal Expression Overall Verbal Expression: Appears within functional limits for tasks assessed Initiation: No impairment Naming: Not tested Other Verbal Expression Comments: Pt able to verbally express her concerns clearly and fluently, no aphasia noted Written Expression Written Expression: Not tested   Oral / Motor Oral Motor/Sensory Function Overall Oral Motor/Sensory Function: Appears within functional limits for tasks assessed Motor Speech Overall Motor Speech: Appears within functional limits for tasks assessed   GO Functional Assessment Tool Used: clinical impression Functional Limitations:  Spoken  language expressive Spoken Language Expression Current Status [phone]): 0 percent impaired, limited or restricted Spoken Language Expression Goal Status [phone]): 0 percent impaired, limited or restricted Spoken Language Expression Discharge Status [phone]): 0 percent impaired, limited or restricted   Donavan Burnet, MS [facility] SLP [phone]

## 2012-05-08 NOTE — Progress Notes (Signed)
*  PRELIMINARY RESULTS* Vascular Ultrasound Carotid Duplex (Doppler) has been completed.  Preliminary findings: Bilateral:  No evidence of hemodynamically significant internal carotid artery stenosis.   Vertebral artery flow is antegrade.     Farrel Demark, RDMS, RVT  05/08/2012, 11:06 AM

## 2012-05-08 NOTE — Evaluation (Signed)
Physical Therapy Evaluation Patient Details Name: Nicole Villa MRN: [id number] DOB: 02-03-1923 Today's Date: 05/08/2012 Time: 4696-2952 PT Time Calculation (min): 15 min  PT Assessment / Plan / Recommendation Clinical Impression  Patient is an 79 y/o female admitted with slurred speech and confusion who presents at baseline level for mobility.  Do feel balance is decreased and pt at risk for falls.  Educated her and daughter in fall prevention techniques and discussed outpatient PT for balance therapy.  Daughter states has done therapy in [place] [facility].  She will follow up with pt's MD to get referral.    PT Assessment  All further PT needs can be met in the next venue of care    Follow Up Recommendations  Outpatient PT          Equipment Recommendations  None recommended by PT          Precautions / Restrictions Precautions Precautions: Fall   Pertinent Vitals/Pain Denies pain      Mobility  Bed Mobility Bed Mobility: Sit to Supine Sit to Supine: 6: Modified independent (Device/Increase time) Transfers Transfers: Sit to Stand;Stand to Sit Sit to Stand: 6: Modified independent (Device/Increase time) Stand to Sit: 6: Modified independent (Device/Increase time) Ambulation/Gait Ambulation/Gait Assistance: 5: Supervision Ambulation Distance (Feet): 250 Feet Assistive device: None Ambulation/Gait Assistance Details: veers from straight path without head turns, demonstrates instability around turns, no loss of balance Gait Pattern: Step-to pattern;Decreased stride length;Decreased hip/knee flexion - right;Decreased hip/knee flexion - left;Trunk flexed    Exercises Other Exercises Other Exercises: pt educated in fall reduction techniques for home with daughter present to include footwear, no floor length clothes, bedskirts off floor, items frequently used in reach, no step stools and use assist on step ups in community and lighting.   PT  Diagnosis: Abnormality of gait  PT Problem List: Decreased balance;Decreased safety awareness PT Treatment Interventions:     PT Goals    Visit Information  Last PT Received On: 05/08/12 Assistance Needed: +1    Subjective Data  Subjective: I just got back from bathroom. Patient Stated Goal: To retun home with spouse   Prior Functioning  Home Living Lives With: Spouse Available Help at Discharge: Family Type of Home: House Home Access: Level entry (threshold) Home Layout: One level Bathroom Shower/Tub: Tub/shower unit Prior Function Level of Independence: Needs assistance Needs Assistance: Meal Prep (assist with medications) Meal Prep: Supervision/set-up Driving: Yes Communication Communication: No difficulties    Cognition  Cognition Overall Cognitive Status: Impaired Area of Impairment: Memory Arousal/Alertness: Awake/alert Orientation Level: Appears intact for tasks assessed Behavior During Session: [facility] for tasks performed Memory Deficits: while walking stated she lived with her dad, then with attention called to error stated dad was dead, lives with spouse    Extremity/Trunk Assessment Right Lower Extremity Assessment RLE ROM/Strength/Tone: WFL for tasks assessed RLE Sensation: WFL - Light Touch Left Lower Extremity Assessment LLE ROM/Strength/Tone: WFL for tasks assessed LLE Sensation: WFL - Light Touch Trunk Assessment Trunk Assessment: Kyphotic   Balance Balance Balance Assessed: Yes Dynamic Sitting Balance Dynamic Sitting - Balance Support: Feet supported;No upper extremity supported;During functional activity Dynamic Sitting - Level of Assistance: 5: Stand by assistance Dynamic Sitting - Balance Activities: Forward lean/weight shifting Dynamic Sitting - Comments: leaning to tie shoe from sitting on tall bed Static Standing Balance Static Standing - Balance Support: No upper extremity supported Static Standing - Level of Assistance: 7:  Independent Static Standing - Comment/# of Minutes: standing while getting  IV pole set to walk  End of Session PT - End of Session Activity Tolerance: Patient tolerated treatment well Patient left: in chair;with family/visitor present  GP Functional Assessment Tool Used: Clincal Judgement Functional Limitation: Mobility: Walking and moving around Mobility: Walking and Moving Around Current Status (U9811): At least 1 percent but less than 20 percent impaired, limited or restricted Mobility: Walking and Moving Around Goal Status [phone] At least 1 percent but less than 20 percent impaired, limited or restricted Mobility: Walking and Moving Around Discharge Status [phone]): At least 1 percent but less than 20 percent impaired, limited or restricted   [facility] 05/08/2012, 4:58 PM  [place], [place] [phone] 05/08/2012

## 2012-05-08 NOTE — Progress Notes (Signed)
Stroke Team Progress Note  HISTORY Nicole Villa is an 79 y.o. female Mr. diabetes mellitus, hypertension and hyperlipidemia who experienced acute onset of markedly slurred speech and confusion around 1:30 this afternoon 05/07/2012. Patient has no history of stroke nor TIA. She's been on aspirin 81 mg per day. CT scan of the head showed no acute intracranial abnormality. No clear focal facial weakness nor extremity weakness was noted by family members. Patient was back to baseline within about 10 minutes of onset of her symptoms. NIH stroke score emergency room was 0. Patient was not a TPA candidate secondary to resolved deficits. She was admitted for further evaluation and treatment.  SUBJECTIVE Her daughter is at the bedside.  Overall she feels her condition is completely resolved. Patient lives with husband. Daughter handles finances and medications.  OBJECTIVE Most recent Vital Signs: Filed Vitals:   05/08/12 0000 05/08/12 0200 05/08/12 0400 05/08/12 0600  BP: 140/52 128/47 142/55 156/61  Pulse: 78 68 85 92  Temp:   98.3 F (36.8 C)   TempSrc:   Oral   Resp: 15 16 16 15   Height:      Weight:   59.285 kg (130 lb 11.2 oz)   SpO2: 97% 95% 96% 95%   CBG (last 3)   Recent Labs  05/07/12 2059 05/08/12 0742  GLUCAP 188* 75    IV Fluid Intake:   . sodium chloride 1,000 mL (05/07/12 1750)    MEDICATIONS  . aspirin  300 mg Rectal Daily   Or  . aspirin  325 mg Oral Daily  . Colesevelam HCl  1 packet Oral Q breakfast  . enoxaparin (LOVENOX) injection  40 mg Subcutaneous Q24H  . ezetimibe  10 mg Oral Daily  . insulin aspart  0-5 Units Subcutaneous QHS  . insulin aspart  0-9 Units Subcutaneous TID WC  . omega-3 acid ethyl esters  1 g Oral Daily  . pantoprazole  40 mg Oral Daily  . simvastatin  20 mg Oral q1800   PRN:  senna-docusate  Diet:  Cardiac thin liquids Activity:  Bedrest with BRP  DVT Prophylaxis:  Lovenox 40 mg sq daily   CLINICALLY SIGNIFICANT STUDIES Basic  Metabolic Panel:  Recent Labs Lab 05/07/12 1419 05/07/12 2112 05/08/12 0513  NA 140  --  143  K 3.6  --  3.5  CL 102  --  105  CO2 27  --  28  GLUCOSE 138*  --  81  BUN 30*  --  23  CREATININE 1.21* 1.05 0.88  CALCIUM 8.9  --  9.0   Liver Function Tests:  Recent Labs Lab 05/07/12 1419  AST 22  ALT 25  ALKPHOS 65  BILITOT 0.2*  PROT 6.6  ALBUMIN 3.3*   CBC:  Recent Labs Lab 05/07/12 1419 05/07/12 2112 05/08/12 0513  WBC 8.2 9.1 7.3  NEUTROABS 5.2  --   --   HGB 10.6* 11.6* 11.0*  HCT 32.1* 34.5* 31.5*  MCV 84.0 83.5 82.2  PLT 287 304 275   Coagulation:  Recent Labs Lab 05/07/12 1419  LABPROT 12.5  INR 0.94   Cardiac Enzymes:  Recent Labs Lab 05/07/12 1419  TROPONINI <0.30   Urinalysis: No results found for this basename: COLORURINE, APPERANCEUR, LABSPEC, PHURINE, GLUCOSEU, HGBUR, BILIRUBINUR, KETONESUR, PROTEINUR, UROBILINOGEN, NITRITE, LEUKOCYTESUR,  in the last 168 hours Lipid Panel    Component Value Date/Time   CHOL 110 05/08/2012 0513   TRIG 103 05/08/2012 0513   HDL 39* 05/08/2012 0513  CHOLHDL 2.8 05/08/2012 0513   VLDL 21 05/08/2012 0513   LDLCALC 50 05/08/2012 0513   HgbA1C  No results found for this basename: HGBA1C    Urine Drug Screen:   No results found for this basename: labopia, cocainscrnur, labbenz, amphetmu, thcu, labbarb    Alcohol Level: No results found for this basename: ETH,  in the last 168 hours  CT of the brain  05/07/2012   No acute intracranial finding.  Chronic findings  MRI of the brain    MRA of the brain    2D Echocardiogram    Carotid Doppler    CXR  05/07/2012   Moderate sized hiatal hernia. Mild enlargement of cardiac silhouette. No acute infiltrate.   EKG  normal sinus rhythm.   Therapy Recommendations   Physical Exam   Frail elderly Caucasian lady not in distress.Awake alert. Afebrile. Head is nontraumatic. Neck is supple without bruit. Hearing is diminished bilaterally. Cardiac exam no murmur or  gallop. Lungs are clear to auscultation. Distal pulses are well felt. Neurological Exam ; awake alert oriented to place and person. Diminished recall and registration. Speech and language appear normal. Extraocular moments are full range without nystagmus. Fundi were not visualized. Vision acuity and fields appear normal. Face is symmetric without weakness. Tongue is midline. Hearing is slightly diminished bilaterally. Motor system exam reveals no upper or lower extended drift. Symmetric and equal strength in all 4 extremities. Minimally diminished fine finger movements on the right. Coordination appears slow but accurate. Gait was not tested. Sensation appears intact. Plantars are both downgoing. ASSESSMENT Ms. Nicole Villa is a 79 y.o. female presenting with markedly slurred speech and confusio likely from the left brain TIA. Initial CT imaging negative. MRI pending.  On aspirin 81 mg orally every day prior to admission. Now on aspirin 325 mg orally every day, she received clopidogrel 75 mg orally yesterday for secondary stroke prevention (one time dose only). Patient with no resultant neuro symptoms. Work up underway.  Diabetes, HgbA1c pending Hypertension Heart murmur Hyperlipidemia, LDL 50, on statin PTA, on statin now, at goal LDL < 100  Hospital day # 1  TREATMENT/PLAN  Change aspirin to  clopidogrel 75 mg orally every day for secondary stroke prevention.  F/u MRI, MRA, 2D, carotid doppler  Therapy evals  Dr. Pearlean Brownie discussed diagnosis, prognosis and plan of care with patient and daughter.  Annie Main, MSN, RN, ANVP-BC, ANP-BC, [facility] Pager: [phone] 05/08/2012 9:38 AM  I have personally obtained a history, examined the patient, evaluated imaging results, and formulated the assessment and plan of care. I agree with the above.  Delia Heady, MD

## 2012-05-08 NOTE — Progress Notes (Signed)
Utilization review completed.  

## 2012-05-08 NOTE — Progress Notes (Signed)
[facility] PROGRESS NOTE  CHIQUITTA MATTY AVW:[id number] DOB: November 11, 1922 DOA: 05/07/2012  PCP: Alva Garnet., MD  Brief HPI: Nicole Villa is a 79 y.o. female with h/o dm, hypertension, was brought by her husband and daughter for slurred speech and confusion that occurred the afternoon of admission at optholmology office. She didn't remember any of the confusion or speech difficulty. She remembered going to the eye doctor and then being brought to ED. She denied any other complaints. Initial CT was neg for bleed or stroke.   Past medical history:  Past Medical History  Diagnosis Date  . Gallstones   . Diabetes mellitus without complication   . Hypertension   . Arthritis   . Heart murmur   . GERD (gastroesophageal reflux disease)   . Hyperlipemia     Consultants: Neuro  Procedures:   2D ECHO pending  Antibiotics: None  Subjective: Patient feels well. No complaints. Symptoms resolved and did nor recur.  Objective: Vital Signs  Filed Vitals:   05/08/12 0000 05/08/12 0200 05/08/12 0400 05/08/12 0600  BP: 140/52 128/47 142/55 156/61  Pulse: 78 68 85 92  Temp:   98.3 F (36.8 C)   TempSrc:   Oral   Resp: 15 16 16 15   Height:      Weight:   59.285 kg (130 lb 11.2 oz)   SpO2: 97% 95% 96% 95%    Intake/Output Summary (Last 24 hours) at 05/08/12 1423 Last data filed at 05/08/12 0800  Gross per 24 hour  Intake 708.33 ml  Output      0 ml  Net 708.33 ml   Filed Weights   05/07/12 2000 05/08/12 0400  Weight: 59.512 kg (131 lb 3.2 oz) 59.285 kg (130 lb 11.2 oz)    General appearance: alert, cooperative, appears stated age and no distress Head: Normocephalic, without obvious abnormality, atraumatic Eyes: conjunctivae/corneas clear. PERRL, EOM's intact. Throat: lips, mucosa, and tongue normal; teeth and gums normal Resp: clear to auscultation bilaterally Cardio: S1, S2 normal, no S3 or S4, systolic murmur: early systolic 3/6, crescendo  throughout the precordium, no click, no rub and no bruit GI: soft, non-tender; bowel sounds normal; no masses,  no organomegaly Extremities: extremities normal, atraumatic, no cyanosis or edema Pulses: 2+ and symmetric Neurologic: Alert and oriented x 3. No focal deficits.  Lab Results:  Basic Metabolic Panel:  Recent Labs Lab 05/07/12 1419 05/07/12 2112 05/08/12 0513  Aleiyah Halpin 140  --  143  K 3.6  --  3.5  CL 102  --  105  CO2 27  --  28  GLUCOSE 138*  --  81  BUN 30*  --  23  CREATININE 1.21* 1.05 0.88  CALCIUM 8.9  --  9.0   Liver Function Tests:  Recent Labs Lab 05/07/12 1419  AST 22  ALT 25  ALKPHOS 65  BILITOT 0.2*  PROT 6.6  ALBUMIN 3.3*   CBC:  Recent Labs Lab 05/07/12 1419 05/07/12 2112 05/08/12 0513  WBC 8.2 9.1 7.3  NEUTROABS 5.2  --   --   HGB 10.6* 11.6* 11.0*  HCT 32.1* 34.5* 31.5*  MCV 84.0 83.5 82.2  PLT 287 304 275   Cardiac Enzymes:  Recent Labs Lab 05/07/12 1419  TROPONINI <0.30   CBG:  Recent Labs Lab 05/07/12 2059 05/08/12 0742 05/08/12 1252  GLUCAP 188* 75 98     Studies/Results: Dg Chest 2 View  05/07/2012  *RADIOLOGY REPORT*  Clinical Data: Stroke, weakness, shortness of breath  CHEST -  2 VIEW  Comparison: None  Findings: Mild enlargement of cardiac silhouette. Moderate sized hiatal hernia. Calcified tortuous aorta. Pulmonary vascularity normal. Lungs clear. No pleural effusion or pneumothorax. Bones diffusely demineralized.  IMPRESSION: Moderate sized hiatal hernia. Mild enlargement of cardiac silhouette. No acute infiltrate.   Original Report Authenticated By: Ulyses Southward, M.D.    Ct Head Wo Contrast  05/07/2012  *RADIOLOGY REPORT*  Clinical Data: Code stroke, confusion, slurred speech, aphasia  CT HEAD WITHOUT CONTRAST  Technique:  Contiguous axial images were obtained from the base of the skull through the vertex without contrast.  Comparison: No similar prior study is available for comparison.  Findings: Periventricular  white matter hypodensity is most compatible with small vessel ischemic change.  Remote external capsule lacunar infarcts are noted.  No midline shift. No acute hemorrhage, acute infarction, or mass lesion is seen.  Diffuse cortical volume loss noted with proportional ventricular prominence.  IMPRESSION: No acute intracranial finding.  Chronic findings as above.   Original Report Authenticated By: Christiana Pellant, M.D.    Mr Brain Wo Contrast  05/08/2012  *RADIOLOGY REPORT*  Clinical Data:  Slurred speech.  Confusion.  MRI HEAD WITHOUT CONTRAST MRA HEAD WITHOUT CONTRAST  Technique:  Multiplanar, multiecho pulse sequences of the brain and surrounding structures were obtained without intravenous contrast. Angiographic images of the head were obtained using MRA technique without contrast.  Comparison:  Head CT 05/07/2012  MRI HEAD  Findings:  Diffusion imaging does not show any acute or subacute infarction.  There are chronic small vessel changes throughout the pons.  There are old small vessel infarctions within the cerebellum.  The cerebral hemispheres show old small vessel insults within the thalami, basal ganglia and throughout the hemispheric white matter.  No cortical or large vessel territory infarction. No mass lesion, hemorrhage, hydrocephalus or extra-axial collection.  No inflammatory sinus disease.  No skull or skull base lesion.  IMPRESSION: No acute finding.  Extensive chronic small vessel changes throughout the brain.  MRA HEAD  Findings: Both internal carotid arteries are widely patent into the brain.  The anterior and middle cerebral vessels are patent.  There is some atherosclerotic irregularity but no flow-limiting stenosis, aneurysm or vascular malformation.  Both vertebral arteries are patent to the basilar.  No basilar stenosis.  Posterior circulation branch vessels are patent.  There is atherosclerotic irregularity of the PCA branch vessels diffusely.  IMPRESSION: No major vessel occlusion or  correctable proximal stenosis.  Some atherosclerotic irregularity of the large and medium-sized vessels diffusely.   Original Report Authenticated By: Paulina Fusi, M.D.    Mr Mra Head/brain Wo Cm  05/08/2012  *RADIOLOGY REPORT*  Clinical Data:  Slurred speech.  Confusion.  MRI HEAD WITHOUT CONTRAST MRA HEAD WITHOUT CONTRAST  Technique:  Multiplanar, multiecho pulse sequences of the brain and surrounding structures were obtained without intravenous contrast. Angiographic images of the head were obtained using MRA technique without contrast.  Comparison:  Head CT 05/07/2012  MRI HEAD  Findings:  Diffusion imaging does not show any acute or subacute infarction.  There are chronic small vessel changes throughout the pons.  There are old small vessel infarctions within the cerebellum.  The cerebral hemispheres show old small vessel insults within the thalami, basal ganglia and throughout the hemispheric white matter.  No cortical or large vessel territory infarction. No mass lesion, hemorrhage, hydrocephalus or extra-axial collection.  No inflammatory sinus disease.  No skull or skull base lesion.  IMPRESSION: No acute finding.  Extensive chronic small vessel  changes throughout the brain.  MRA HEAD  Findings: Both internal carotid arteries are widely patent into the brain.  The anterior and middle cerebral vessels are patent.  There is some atherosclerotic irregularity but no flow-limiting stenosis, aneurysm or vascular malformation.  Both vertebral arteries are patent to the basilar.  No basilar stenosis.  Posterior circulation branch vessels are patent.  There is atherosclerotic irregularity of the PCA branch vessels diffusely.  IMPRESSION: No major vessel occlusion or correctable proximal stenosis.  Some atherosclerotic irregularity of the large and medium-sized vessels diffusely.   Original Report Authenticated By: Paulina Fusi, M.D.     Medications:  Scheduled: . clopidogrel  75 mg Oral Q breakfast  .  Colesevelam HCl  1 packet Oral Q breakfast  . enoxaparin (LOVENOX) injection  40 mg Subcutaneous Q24H  . ezetimibe  10 mg Oral Daily  . insulin aspart  0-5 Units Subcutaneous QHS  . insulin aspart  0-9 Units Subcutaneous TID WC  . omega-3 acid ethyl esters  1 g Oral Daily  . pantoprazole  40 mg Oral Daily  . simvastatin  20 mg Oral q1800   Continuous: . sodium chloride 1,000 mL (05/07/12 1750)   [id number]  Assessment/Plan:  Active Problems:   Hypertension   Diabetes mellitus   ARF (acute renal failure)   Anemia, unspecified   TIA (transient ischemic attack)    TIA Work up in progress. Negative so far. Started on Plavix by Neuro. Await ECHO. Doesn't appear to need any PT/OT. On statin.  DM2  Continue SSI   Hypertension Monitor BP.   Very Mild Acute renal failure Possibly from dehydration. Resolved.   Anemia; normocytic Anemia panel reviewed.   DVT prophylaxis: Enoxaparin  Code Status: full code  Family Communication: family at bedside, daughter  Disposition Plan: Anticipate discharge in AM    LOS: 1 day   [facility]  [facility] Pager [phone] 05/08/2012, 2:23 PM  If 8PM-8AM, please contact night-coverage at [email or web address], password [email or web address]

## 2012-05-08 NOTE — Progress Notes (Signed)
  Echocardiogram 2D Echocardiogram has been performed.  Cathie Beams 05/08/2012, 11:32 AM

## 2012-05-09 DIAGNOSIS — I10 Essential (primary) hypertension: Secondary | ICD-10-CM | POA: Diagnosis not present

## 2012-05-09 DIAGNOSIS — G459 Transient cerebral ischemic attack, unspecified: Secondary | ICD-10-CM | POA: Diagnosis not present

## 2012-05-09 DIAGNOSIS — E119 Type 2 diabetes mellitus without complications: Secondary | ICD-10-CM | POA: Diagnosis not present

## 2012-05-09 LAB — GLUCOSE, CAPILLARY: Glucose-Capillary: 93 mg/dL (ref 70–99)

## 2012-05-09 MED ORDER — CLOPIDOGREL BISULFATE 75 MG PO TABS: 75.0000 mg | ORAL_TABLET | Freq: Every day | ORAL | Status: DC

## 2012-05-09 NOTE — Care Management Note (Signed)
    Page 1 of 1   05/09/2012     11:26:46 AM   CARE MANAGEMENT NOTE 05/09/2012  Patient:  Nicole Villa, Nicole Villa   Account Number:  [id number]  Date Initiated:  05/09/2012  Documentation initiated by:  GRAVES-BIGELOW,BRENDA  Subjective/Objective Assessment:   Pt admitted with slurred speech and weakness. Plan for home today.     Action/Plan:   PT recommends outpatient PT, however family thinks HH services will be the best based on transportation needs. CM discussed with MD and orders written for home services. CM did make referral with [facility]. SOC to begin within 24-48 hrs post d/c.   Anticipated DC Date:  05/09/2012   Anticipated DC Plan:  HOME W HOME HEALTH SERVICES      DC Planning Services  CM consult      [facility] Choice  HOME HEALTH   Choice offered to / List presented to:  C-4 Adult Children        HH arranged  HH-2 PT      [facility] agency  [facility]   Status of service:  Completed, signed off Medicare Important Message given?   (If response is "NO", the following Medicare IM given date fields will be blank) Date Medicare IM given:   Date Additional Medicare IM given:    Discharge Disposition:  HOME W HOME HEALTH SERVICES  Per UR Regulation:  Reviewed for med. necessity/level of care/duration of stay  If discussed at Long Length of Stay Meetings, dates discussed:    Comments:

## 2012-05-09 NOTE — Progress Notes (Signed)
Pt discharged to home per MD order. Pt and daughter received and reviewed all discharge instructions and medication information including follow-up appointments. Pt also reviewed stroke education. Pt and family verbalized understanding. Pt alert and oriented at discharge with no complaints of pain. NIH documented prior to discharge.  Pt escorted to private vehicle via wheelchair by guest services. Efraim Kaufmann

## 2012-05-09 NOTE — Progress Notes (Signed)
Stroke Team Progress Note  HISTORY Nicole Villa is an 79 y.o. female Mr. diabetes mellitus, hypertension and hyperlipidemia who experienced acute onset of markedly slurred speech and confusion around 1:30 this afternoon 05/07/2012. Patient has no history of stroke nor TIA. She's been on aspirin 81 mg per day. CT scan of the head showed no acute intracranial abnormality. No clear focal facial weakness nor extremity weakness was noted by family members. Patient was back to baseline within about 10 minutes of onset of her symptoms. NIH stroke score emergency room was 0. Patient was not a TPA candidate secondary to resolved deficits. She was admitted for further evaluation and treatment.  SUBJECTIVE Daughter at bedside. Patient up in room, wearning jeans, hospital gowna dn nautical sweatshirt. She is excited to consider going home today.  OBJECTIVE Most recent Vital Signs: Filed Vitals:   05/08/12 2031 05/08/12 2348 05/09/12 0015 05/09/12 0400  BP: 154/67 131/67 152/70 140/64  Pulse: 84 99  104  Temp: 97.9 F (36.6 C) 98.1 F (36.7 C)  97.7 F (36.5 C)  TempSrc: Oral Oral  Oral  Resp: 20 20  20   Height:      Weight:      SpO2: 96% 96%  99%   CBG (last 3)   Recent Labs  05/08/12 1728 05/08/12 2124 05/09/12 0806  GLUCAP 113* 183* 93    IV Fluid Intake:   . sodium chloride 1,000 mL (05/07/12 1750)    MEDICATIONS  . clopidogrel  75 mg Oral Q breakfast  . Colesevelam HCl  1 packet Oral Q breakfast  . enoxaparin (LOVENOX) injection  40 mg Subcutaneous Q24H  . ezetimibe  10 mg Oral Daily  . insulin aspart  0-5 Units Subcutaneous QHS  . insulin aspart  0-9 Units Subcutaneous TID WC  . omega-3 acid ethyl esters  1 g Oral Daily  . pantoprazole  40 mg Oral Daily  . simvastatin  20 mg Oral q1800   PRN:  senna-docusate  Diet:  Cardiac thin liquids Activity:  Bedrest with BRP  DVT Prophylaxis:  Lovenox 40 mg sq daily   CLINICALLY SIGNIFICANT STUDIES Basic Metabolic Panel:    Recent Labs Lab 05/07/12 1419 05/07/12 2112 05/08/12 0513  NA 140  --  143  K 3.6  --  3.5  CL 102  --  105  CO2 27  --  28  GLUCOSE 138*  --  81  BUN 30*  --  23  CREATININE 1.21* 1.05 0.88  CALCIUM 8.9  --  9.0   Liver Function Tests:   Recent Labs Lab 05/07/12 1419  AST 22  ALT 25  ALKPHOS 65  BILITOT 0.2*  PROT 6.6  ALBUMIN 3.3*   CBC:   Recent Labs Lab 05/07/12 1419 05/07/12 2112 05/08/12 0513  WBC 8.2 9.1 7.3  NEUTROABS 5.2  --   --   HGB 10.6* 11.6* 11.0*  HCT 32.1* 34.5* 31.5*  MCV 84.0 83.5 82.2  PLT 287 304 275   Coagulation:   Recent Labs Lab 05/07/12 1419  LABPROT 12.5  INR 0.94   Cardiac Enzymes:   Recent Labs Lab 05/07/12 1419  TROPONINI <0.30   Urinalysis: No results found for this basename: COLORURINE, APPERANCEUR, LABSPEC, PHURINE, GLUCOSEU, HGBUR, BILIRUBINUR, KETONESUR, PROTEINUR, UROBILINOGEN, NITRITE, LEUKOCYTESUR,  in the last 168 hours Lipid Panel    Component Value Date/Time   CHOL 110 05/08/2012 0513   TRIG 103 05/08/2012 0513   HDL 39* 05/08/2012 0513   CHOLHDL 2.8 05/08/2012  0513   VLDL 21 05/08/2012 0513   LDLCALC 50 05/08/2012 0513   HgbA1C  Lab Results  Component Value Date   HGBA1C 6.5* 05/08/2012    Urine Drug Screen:   No results found for this basename: labopia,  cocainscrnur,  labbenz,  amphetmu,  thcu,  labbarb    Alcohol Level: No results found for this basename: ETH,  in the last 168 hours  CT of the brain  05/07/2012   No acute intracranial finding.  Chronic findings  MRI of the brain  05/07/2012 No acute finding. Extensive chronic small vessel changes throughout the brain.  MRA of the brain 05/07/2012 No major vessel occlusion or correctable proximal stenosis. Some  atherosclerotic irregularity of the large and medium-sized vessels diffusely.  2D Echocardiogram  Normal LV size with mild LV hypertrophy, EF 60-65%. Normal RV size and systolic function. Aortic sclerosis without stenosis.   Carotid  Doppler  No evidence of hemodynamically significant internal carotid artery stenosis. Vertebral artery flow is antegrade.   CXR  05/07/2012   Moderate sized hiatal hernia. Mild enlargement of cardiac silhouette. No acute infiltrate.   EKG  normal sinus rhythm.   Therapy Recommendations OP PT,   Physical Exam   Frail elderly Caucasian lady not in distress.Awake alert. Afebrile. Head is nontraumatic. Neck is supple without bruit. Hearing is diminished bilaterally. Cardiac exam no murmur or gallop. Lungs are clear to auscultation. Distal pulses are well felt. Neurological Exam ; awake alert oriented to place and person. Diminished recall and registration. Speech and language appear normal. Extraocular moments are full range without nystagmus. Fundi were not visualized. Vision acuity and fields appear normal. Face is symmetric without weakness. Tongue is midline. Hearing is slightly diminished bilaterally. Motor system exam reveals no upper or lower extended drift. Symmetric and equal strength in all 4 extremities. Minimally diminished fine finger movements on the right. Coordination appears slow but accurate. Gait was not tested. Sensation appears intact. Plantars are both downgoing.  ASSESSMENT Nicole Villa is a 79 y.o. female presenting with markedly slurred speech and confusio likely from the left brain TIA. Initial CT imaging negative. Dx: Left brain TIA. On aspirin 81 mg orally every day prior to admission. Now on clopidogrel 75 mg orally yesterday for secondary stroke prevention. Patient with no resultant neuro symptoms. Work up underway.  Diabetes, HgbA1c 6.5 Hypertension Heart murmur Hyperlipidemia, LDL 50, on statin PTA, on statin now, at goal LDL < 100  Hospital day # 2  TREATMENT/PLAN  Continue clopidogrel 75 mg orally every day for secondary stroke prevention. No further stroke workup indicated. Ok for discharge from neuro standpoint Patient has a 10-15% risk of having  another stroke over the next year, the highest risk is within 2 weeks of the most recent stroke/TIA (risk of having a stroke following a stroke or TIA is the same). Ongoing risk factor control by [name], MD Stroke Service will sign off. Please call should any needs arise. Follow up with Dr. Pearlean Brownie, Stroke Clinic, in 2 months.  Dr. Pearlean Brownie discussed diagnosis, prognosis and plan of care with patient and daughter.  Annie Main, MSN, RN, ANVP-BC, ANP-BC, [facility] Pager: [phone] 05/09/2012 8:17 AM  I have personally obtained a history, examined the patient, evaluated imaging results, and formulated the assessment and plan of care. I agree with the above.  Delia Heady, MD

## 2012-05-09 NOTE — Discharge Summary (Signed)
[facility]  Physician Discharge Summary   Patient ID: Nicole Villa MRN: [id number] DOB/AGE: 77/27/1924 77 y.o.  Admit date: 05/07/2012 Discharge date: 05/09/2012  PCP: [name], MD  DISCHARGE DIAGNOSES:  Active Problems:   Hypertension   Diabetes mellitus   ARF (acute renal failure)   Anemia, unspecified   TIA (transient ischemic attack)   RECOMMENDATIONS FOR OUTPATIENT FOLLOW UP: 1. Need outpatient PT 2. Have stopped Amaryl due to low CBG and A1c of 6.5. Please reassess at follow up.  DISCHARGE CONDITION: fair  Diet recommendation: Mod Carb  Filed Weights   05/07/12 2000 05/08/12 0400  Weight: 59.512 kg (131 lb 3.2 oz) 59.285 kg (130 lb 11.2 oz)    INITIAL HISTORY: Nicole Villa is a 79 y.o. female with h/o dm, hypertension, was brought by her husband and daughter for slurred speech and confusion that occurred the afternoon of admission at optholmology office. She didn't remember any of the confusion or speech difficulty. She remembered going to the eye doctor and then being brought to ED. She denied any other complaints. Initial CT was neg for bleed or stroke.   Consultations:  Neurology  Procedures: 2D ECHO 3/12 Study Conclusions  - Left ventricle: The cavity size was normal. Wall thickness was increased in a pattern of mild LVH. Systolic function was normal. The estimated ejection fraction was in the range of 60% to 65%. Wall motion was normal; there were no regional wall motion abnormalities. Doppler parameters are consistent with abnormal left ventricular relaxation (grade 1 diastolic dysfunction). Septal thickness:52mm (ED, 2D). - Aortic valve: Sclerosis without stenosis. - Mitral valve: Mildly to moderately calcified annulus. No significant regurgitation. - Left atrium: The atrium was moderately dilated. - Right ventricle: The cavity size was normal. Systolic function was normal. - Pulmonary arteries: No complete TR doppler jet so  unable to estimate PA systolic pressure. - Inferior vena cava: The vessel was normal in size; the respirophasic diameter changes were in the normal range (= 50%); findings are consistent with normal central venous pressure. - Pericardium, extracardiac: A trivial pericardial effusion was identified posterior to the heart. Impressions: - Normal LV size with mild LV hypertrophy, EF 60-65%. Normal RV size and systolic function. Aortic sclerosis without stenosis.  Carotid Doppler 3/12 No significant extracranial carotid artery stenosis demonstrated. Vertebrals are patent with antegrade flow.   HOSPITAL COURSE:   TIA  Her symptoms had resolved by the time she arrived to the ED. She was observed for TIA work up. All testing has been completed. No acute findings noted. Neurology recommends Plavix for secondary stroke prevention. Already on Statin. Outpatient PT to be arranged.  DM2  Her CBG's are running low normal. HBA1C is 6.5. Due to high risk of hypoglycemia will recommend stopping Amaryl for now. Patient to discuss this further with her PCP.   Hypertension  Stable. Continue antihypertensive regiment at home.   Very Mild Acute renal failure  Possibly from dehydration. Resolved with gentle hydration.   Anemia; normocytic  Anemia panel reviewed and no deficiencies noted.  Overall patient is doing better. She is stable for discharge.    PERTINENT LABS:  The results of significant diagnostics from this hospitalization (including imaging, microbiology, ancillary and laboratory) are listed below for reference.     Labs: Basic Metabolic Panel:  Recent Labs Lab 05/07/12 1419 05/07/12 2112 05/08/12 0513  NA 140  --  143  K 3.6  --  3.5  CL 102  --  105  CO2 27  --  28  GLUCOSE 138*  --  81  BUN 30*  --  23  CREATININE 1.21* 1.05 0.88  CALCIUM 8.9  --  9.0   Liver Function Tests:  Recent Labs Lab 05/07/12 1419  AST 22  ALT 25  ALKPHOS 65  BILITOT 0.2*  PROT 6.6  ALBUMIN  3.3*   CBC:  Recent Labs Lab 05/07/12 1419 05/07/12 2112 05/08/12 0513  WBC 8.2 9.1 7.3  NEUTROABS 5.2  --   --   HGB 10.6* 11.6* 11.0*  HCT 32.1* 34.5* 31.5*  MCV 84.0 83.5 82.2  PLT 287 304 275   Cardiac Enzymes:  Recent Labs Lab 05/07/12 1419  TROPONINI <0.30   CBG:  Recent Labs Lab 05/08/12 0742 05/08/12 1252 05/08/12 1728 05/08/12 2124 05/09/12 0806  GLUCAP 75 98 113* 183* 93     IMAGING STUDIES Dg Chest 2 View  05/07/2012  *RADIOLOGY REPORT*  Clinical Data: Stroke, weakness, shortness of breath  CHEST - 2 VIEW  Comparison: None  Findings: Mild enlargement of cardiac silhouette. Moderate sized hiatal hernia. Calcified tortuous aorta. Pulmonary vascularity normal. Lungs clear. No pleural effusion or pneumothorax. Bones diffusely demineralized.  IMPRESSION: Moderate sized hiatal hernia. Mild enlargement of cardiac silhouette. No acute infiltrate.   Original Report Authenticated By: Ulyses Southward, M.D.    Ct Head Wo Contrast  05/07/2012  *RADIOLOGY REPORT*  Clinical Data: Code stroke, confusion, slurred speech, aphasia  CT HEAD WITHOUT CONTRAST  Technique:  Contiguous axial images were obtained from the base of the skull through the vertex without contrast.  Comparison: No similar prior study is available for comparison.  Findings: Periventricular white matter hypodensity is most compatible with small vessel ischemic change.  Remote external capsule lacunar infarcts are noted.  No midline shift. No acute hemorrhage, acute infarction, or mass lesion is seen.  Diffuse cortical volume loss noted with proportional ventricular prominence.  IMPRESSION: No acute intracranial finding.  Chronic findings as above.   Original Report Authenticated By: Christiana Pellant, M.D.    Mr Brain Wo Contrast  05/08/2012  *RADIOLOGY REPORT*  Clinical Data:  Slurred speech.  Confusion.  MRI HEAD WITHOUT CONTRAST MRA HEAD WITHOUT CONTRAST  Technique:  Multiplanar, multiecho pulse sequences of the  brain and surrounding structures were obtained without intravenous contrast. Angiographic images of the head were obtained using MRA technique without contrast.  Comparison:  Head CT 05/07/2012  MRI HEAD  Findings:  Diffusion imaging does not show any acute or subacute infarction.  There are chronic small vessel changes throughout the pons.  There are old small vessel infarctions within the cerebellum.  The cerebral hemispheres show old small vessel insults within the thalami, basal ganglia and throughout the hemispheric white matter.  No cortical or large vessel territory infarction. No mass lesion, hemorrhage, hydrocephalus or extra-axial collection.  No inflammatory sinus disease.  No skull or skull base lesion.  IMPRESSION: No acute finding.  Extensive chronic small vessel changes throughout the brain.  MRA HEAD  Findings: Both internal carotid arteries are widely patent into the brain.  The anterior and middle cerebral vessels are patent.  There is some atherosclerotic irregularity but no flow-limiting stenosis, aneurysm or vascular malformation.  Both vertebral arteries are patent to the basilar.  No basilar stenosis.  Posterior circulation branch vessels are patent.  There is atherosclerotic irregularity of the PCA branch vessels diffusely.  IMPRESSION: No major vessel occlusion or correctable proximal stenosis.  Some atherosclerotic irregularity of the large and medium-sized vessels diffusely.   Original  Report Authenticated By: Paulina Fusi, M.D.    Mr Mra Head/brain Wo Cm  05/08/2012  *RADIOLOGY REPORT*  Clinical Data:  Slurred speech.  Confusion.  MRI HEAD WITHOUT CONTRAST MRA HEAD WITHOUT CONTRAST  Technique:  Multiplanar, multiecho pulse sequences of the brain and surrounding structures were obtained without intravenous contrast. Angiographic images of the head were obtained using MRA technique without contrast.  Comparison:  Head CT 05/07/2012  MRI HEAD  Findings:  Diffusion imaging does not show any  acute or subacute infarction.  There are chronic small vessel changes throughout the pons.  There are old small vessel infarctions within the cerebellum.  The cerebral hemispheres show old small vessel insults within the thalami, basal ganglia and throughout the hemispheric white matter.  No cortical or large vessel territory infarction. No mass lesion, hemorrhage, hydrocephalus or extra-axial collection.  No inflammatory sinus disease.  No skull or skull base lesion.  IMPRESSION: No acute finding.  Extensive chronic small vessel changes throughout the brain.  MRA HEAD  Findings: Both internal carotid arteries are widely patent into the brain.  The anterior and middle cerebral vessels are patent.  There is some atherosclerotic irregularity but no flow-limiting stenosis, aneurysm or vascular malformation.  Both vertebral arteries are patent to the basilar.  No basilar stenosis.  Posterior circulation branch vessels are patent.  There is atherosclerotic irregularity of the PCA branch vessels diffusely.  IMPRESSION: No major vessel occlusion or correctable proximal stenosis.  Some atherosclerotic irregularity of the large and medium-sized vessels diffusely.   Original Report Authenticated By: Paulina Fusi, M.D.     DISCHARGE EXAMINATION: Filed Vitals:   05/08/12 2348 05/09/12 0015 05/09/12 0400 05/09/12 0900  BP: 131/67 152/70 140/64 130/65  Pulse: 99  104 70  Temp: 98.1 F (36.7 C)  97.7 F (36.5 C) 98.3 F (36.8 C)  TempSrc: Oral  Oral Oral  Resp: 20  20 18   Height:      Weight:      SpO2: 96%  99% 97%   General appearance: alert, cooperative, appears stated age and no distress Resp: clear to auscultation bilaterally Cardio: S1, S2 normal, no S3 or S4, systolic murmur: holosystolic 3/6, blowing throughout the precordium, no click and no rub GI: soft, non-tender; bowel sounds normal; no masses,  no organomegaly Neurologic: Alert and oriented X 3, normal strength and tone. Normal symmetric  reflexes. Normal coordination and gait  DISPOSITION: Home  Discharge Orders   Future Orders Complete By Expires     Diet Carb Modified  As directed     Discharge instructions  As directed     Comments:      Your blood sugars have been on the lower side and so we are recommending that you stop taking the Amaryl for now. Please follow up with your PCP to discuss diabetes management. You need outpatient physical therapy.    Increase activity slowly  As directed       Current Discharge Medication List    START taking these medications   Details  clopidogrel (PLAVIX) 75 MG tablet Take 1 tablet (75 mg total) by mouth daily with breakfast. Qty: 30 tablet, Refills: 1      CONTINUE these medications which have NOT CHANGED   Details  alendronate (FOSAMAX) 70 MG tablet Take 70 mg by mouth every 7 (seven) days. Take with a full glass of water on an empty stomach.    amLODipine (NORVASC) 10 MG tablet Take 10 mg by mouth daily.  beta carotene w/minerals (OCUVITE) tablet Take 1 tablet by mouth daily.    cholecalciferol (VITAMIN D) 1000 UNITS tablet Take 1,000 Units by mouth daily.    Colesevelam HCl (WELCHOL) 3.75 G PACK Take 1 packet by mouth daily with breakfast.    ezetimibe (ZETIA) 10 MG tablet Take 10 mg by mouth daily.    fish oil-omega-3 fatty acids 1000 MG capsule Take 1 g by mouth daily.    mirabegron ER (MYRBETRIQ) 25 MG TB24 Take 25 mg by mouth daily.    nabumetone (RELAFEN) 500 MG tablet Take 500 mg by mouth daily.    pantoprazole (PROTONIX) 40 MG tablet Take 40 mg by mouth daily.    pravastatin (PRAVACHOL) 20 MG tablet Take 20 mg by mouth daily.    valsartan-hydrochlorothiazide (DIOVAN-HCT) 320-12.5 MG per tablet Take 1 tablet by mouth daily.      STOP taking these medications     glimepiride (AMARYL) 1 MG tablet        Follow-up Information   Follow up with SHAW,KIMBERLEE, MD. Schedule an appointment as soon as possible for a visit in 1 week.   Contact  information:   [address] [place] [address] [phone]       TOTAL DISCHARGE TIME: 35 mins  [facility]  [facility] Pager [phone]  05/09/2012, 10:52 AM

## 2012-06-05 DIAGNOSIS — Z1231 Encounter for screening mammogram for malignant neoplasm of breast: Secondary | ICD-10-CM | POA: Diagnosis not present

## 2012-06-07 DIAGNOSIS — G459 Transient cerebral ischemic attack, unspecified: Secondary | ICD-10-CM | POA: Diagnosis not present

## 2012-06-07 DIAGNOSIS — D649 Anemia, unspecified: Secondary | ICD-10-CM | POA: Diagnosis not present

## 2012-06-07 DIAGNOSIS — E1129 Type 2 diabetes mellitus with other diabetic kidney complication: Secondary | ICD-10-CM | POA: Diagnosis not present

## 2012-06-07 DIAGNOSIS — M25519 Pain in unspecified shoulder: Secondary | ICD-10-CM | POA: Diagnosis not present

## 2012-06-11 DIAGNOSIS — R928 Other abnormal and inconclusive findings on diagnostic imaging of breast: Secondary | ICD-10-CM | POA: Diagnosis not present

## 2012-06-13 DIAGNOSIS — M810 Age-related osteoporosis without current pathological fracture: Secondary | ICD-10-CM | POA: Diagnosis not present

## 2012-06-13 DIAGNOSIS — IMO0002 Reserved for concepts with insufficient information to code with codable children: Secondary | ICD-10-CM | POA: Diagnosis not present

## 2012-06-20 DIAGNOSIS — H2510 Age-related nuclear cataract, unspecified eye: Secondary | ICD-10-CM | POA: Diagnosis not present

## 2012-06-20 DIAGNOSIS — H353190 Nonexudative age-related macular degeneration, unspecified eye, stage unspecified: Secondary | ICD-10-CM | POA: Diagnosis not present

## 2012-06-20 DIAGNOSIS — Z961 Presence of intraocular lens: Secondary | ICD-10-CM | POA: Diagnosis not present

## 2012-08-02 DIAGNOSIS — R109 Unspecified abdominal pain: Secondary | ICD-10-CM | POA: Diagnosis not present

## 2012-08-05 ENCOUNTER — Telehealth: Payer: Self-pay | Admitting: *Deleted

## 2012-08-05 ENCOUNTER — Ambulatory Visit (INDEPENDENT_AMBULATORY_CARE_PROVIDER_SITE_OTHER): Payer: Medicare Other | Admitting: Neurology

## 2012-08-05 ENCOUNTER — Encounter: Payer: Self-pay | Admitting: Neurology

## 2012-08-05 VITALS — BP 122/66 | HR 86 | Temp 97.4°F | Ht <= 58 in | Wt 127.0 lb

## 2012-08-05 DIAGNOSIS — R209 Unspecified disturbances of skin sensation: Secondary | ICD-10-CM

## 2012-08-05 DIAGNOSIS — F0390 Unspecified dementia without behavioral disturbance: Secondary | ICD-10-CM | POA: Diagnosis not present

## 2012-08-05 DIAGNOSIS — G459 Transient cerebral ischemic attack, unspecified: Secondary | ICD-10-CM | POA: Diagnosis not present

## 2012-08-05 NOTE — Progress Notes (Signed)
[facility] [address] [place]. [place] [address] [phone]       OFFICE FOLLOW-UP NOTE  Ms. Nicole Villa Date of Birth:  17-Oct-1922 Medical Record Number:  [id number]   HPI: 79 year old lady with transient episode of slurred speech and confusion likely left brain TIA  in March 2014  She is seen today for first office followup visit for in hospital consultation for TIA on 05/07/12. She was admitted with a transient episode of confusion and speech difficulties. She was unable to speak and the words were quite garbled and slurred. Her symptoms resolved fairly quickly after coming to the hospital. CT head showed no acute abnormality. MRI scan of the brain also did not show an acute stroke. MRA of the brain showed no large vessel stenosis. There were extensive chronic small vessel ischemic changes noted on MRI brain. Transthoracic echo showed normal ejection fraction without cardiac source of embolism. Carotid Doppler showed no significant stenosis. Hemoglobin A1c was borderline at 6.5. Urine drug screen was negative. Total cholesterol was 110, HDL 39 and LDL 50 mg percent. Patient was started on Plavix for secondary stroke prevention. She states she's done well sensation is until a few days ago when she had another similar episode on 08/03/12 when she had a 10 minute episode of nonsensical speech and difficulty expressing herself though she was fully awake and aware of what was happening. She did not have any noticeable facial droop or extremity weakness gait or balance problem. She has been compliant with her medications. She is also now complaining of tingling and numbness in the left hand it at night and often wakes up. She denies any prior history of carpal tunnel a significant degenerative neck disease her radicular pain. Patient has a history of progressive memory loss for the last 2 years. She was tried on Aricept but did not tolerate it due to cognitive side effects. The  patient's daughter does admit that she is requiring more help and supervision at home. The daughter is not willing to undergo further evaluation for dementia including labs, EEG or imaging treatment options like Namenda, Exelon or galantamine at the present time. ROS:   14 system review of systems is positive for  memory loss, confusion , hand paresthesias only PMH:  Past Medical History  Diagnosis Date  . Gallstones   . Diabetes mellitus without complication   . Hypertension   . Arthritis   . Heart murmur   . GERD (gastroesophageal reflux disease)   . Hyperlipemia     Social History:  History   Social History  . Marital Status: Married    Spouse Name: N/A    Number of Children: N/A  . Years of Education: N/A   Occupational History  . Not on file.   Social History Main Topics  . Smoking status: Never Smoker   . Smokeless tobacco: Never Used  . Alcohol Use: No  . Drug Use: No  . Sexually Active: Not on file   Other Topics Concern  . Not on file   Social History Narrative  . No narrative on file    Medications:   Current Outpatient Prescriptions on File Prior to Visit  Medication Sig Dispense Refill  . alendronate (FOSAMAX) 70 MG tablet Take 70 mg by mouth every 7 (seven) days. Take with a full glass of water on an empty stomach.      Marland Kitchen amLODipine (NORVASC) 10 MG tablet Take 10 mg by mouth daily.      Marland Kitchen  beta carotene w/minerals (OCUVITE) tablet Take 1 tablet by mouth daily.      . cholecalciferol (VITAMIN D) 1000 UNITS tablet Take 1,000 Units by mouth daily.      . clopidogrel (PLAVIX) 75 MG tablet Take 1 tablet (75 mg total) by mouth daily with breakfast.  30 tablet  1  . Colesevelam HCl (WELCHOL) 3.75 G PACK Take 1 packet by mouth daily with breakfast.      . ezetimibe (ZETIA) 10 MG tablet Take 10 mg by mouth daily.      . fish oil-omega-3 fatty acids 1000 MG capsule Take 1 g by mouth daily.      . nabumetone (RELAFEN) 500 MG tablet Take 500 mg by mouth daily.       . pantoprazole (PROTONIX) 40 MG tablet Take 40 mg by mouth daily.      . pravastatin (PRAVACHOL) 20 MG tablet Take 20 mg by mouth daily.      . valsartan-hydrochlorothiazide (DIOVAN-HCT) 320-12.5 MG per tablet Take 1 tablet by mouth daily.       No current facility-administered medications on file prior to visit.    Allergies:   Allergies  Allergen Reactions  . Lisinopril Cough   Filed Vitals:   08/05/12 1409  BP: 122/66  Pulse: 86  Temp: 97.4 F (36.3 C)     Physical Exam General: Frail elderly petite Caucasian lady seated, in no evident distress Head: head normocephalic and atraumatic. Orohparynx benign Neck: supple with no carotid or supraclavicular bruits Cardiovascular: regular rate and rhythm, no murmurs Musculoskeletal: Moderate kyphoscoliosis Skin:  no rash/petichiae Vascular:  Normal pulses all extremities  Neurologic Exam Mental Status: Awake and fully alert. Oriented to place and time. Recent memory poor and remote memory intact. Attention span, concentration and fund of knowledge appropriate. Mood and affect appropriate. Diminished recall, attention and calculation. Able to follow two-step commands. MMSE 17/30. Animal fluency test 7 only. Clock drawing 2/4. Geriatric depression scale 2 - not depressed. Cranial Nerves: Fundoscopic exam reveals sharp disc margins. Pupils equal, briskly reactive to light. Extraocular movements full without nystagmus. Visual fields full to confrontation. Hearing intact. Facial sensation intact. Face, tongue, palate moves normally and symmetrically.  Motor: Normal bulk and tone. Normal strength in all tested extremity muscles. Sensory.: intact to tough and pinprick and vibratory. Tinel`s sign is negative over both wrists and ulnar grooves. Coordination: Rapid alternating movements normal in all extremities. Finger-to-nose and heel-to-shin performed accurately bilaterally. Gait and Station: Arises from chair without difficulty. Stance is  normal. Gait demonstrates normal stride length and balance . Able to heel, toe and tandem walk with only mild difficulty.  Reflexes: 1+ and symmetric. Toes downgoing.     ASSESSMENT:  79 year old lady with transient episode of slurred speech and confusion likely left brain TIA  in March 2014 as well as her recurrent episode on 08/03/12 as well. Vascular risk factors of diabetes, hypertension, hyperlipidemia and age. Mild senile dementia and nocturnal left hand paresthesias likely from carpal tunnel.   PLAN: Continue Plavix for secondary stroke prevention with strict control of hypertension with blood pressure goal below 130/90 and lipids with LDL cholesterol goal below 100 mg percent. The patient refuses to consider changing from Plavix to Aggrenox. I have advised her to wear wrist extension splints on her left hand for carpal tunnel. The patient and daughter do not want to consider aggressive evaluation and treatment for dementia and carpal tunnel at the present time but will call me if they change her mind.  She has not tolerated Aricept in the past and is not willing to try Exelon ,galantamine or Namenda at the current time Return for follow up in 3 months with Larita Fife, NP

## 2012-08-05 NOTE — Telephone Encounter (Signed)
Tomeko, came to me about appt for pt.   Was seen in 04/2012 for stroke and needed f/u.  Had episode this weekend, tia?  Made WIV for today at 1330.  Pts family informed.

## 2012-08-05 NOTE — Patient Instructions (Addendum)
Continue Plavix for seconds prevention with strict control of hypertension with blood pressure goal below 130/90 and lipids with LDL cholesterol goal below 100 mg percent. I have advised her to wear wrist extension splints on the left hand while sleeping. The patient's daughter does not want to pursue further evaluation for carpal tunnel or dementia workup or treatment with Exelon at the present time. Return for followup in 2 months with Larita Fife, NP

## 2012-09-02 ENCOUNTER — Ambulatory Visit: Payer: Self-pay | Admitting: Neurology

## 2012-09-04 ENCOUNTER — Ambulatory Visit: Payer: Self-pay | Admitting: Neurology

## 2012-11-04 DIAGNOSIS — Z23 Encounter for immunization: Secondary | ICD-10-CM | POA: Diagnosis not present

## 2012-11-04 DIAGNOSIS — R82998 Other abnormal findings in urine: Secondary | ICD-10-CM | POA: Diagnosis not present

## 2012-11-04 DIAGNOSIS — M1990 Unspecified osteoarthritis, unspecified site: Secondary | ICD-10-CM | POA: Diagnosis not present

## 2012-11-04 DIAGNOSIS — N318 Other neuromuscular dysfunction of bladder: Secondary | ICD-10-CM | POA: Diagnosis not present

## 2012-11-04 DIAGNOSIS — E7800 Pure hypercholesterolemia, unspecified: Secondary | ICD-10-CM | POA: Diagnosis not present

## 2012-11-04 DIAGNOSIS — I129 Hypertensive chronic kidney disease with stage 1 through stage 4 chronic kidney disease, or unspecified chronic kidney disease: Secondary | ICD-10-CM | POA: Diagnosis not present

## 2012-11-04 DIAGNOSIS — E1129 Type 2 diabetes mellitus with other diabetic kidney complication: Secondary | ICD-10-CM | POA: Diagnosis not present

## 2012-11-07 ENCOUNTER — Ambulatory Visit: Payer: Medicare Other | Admitting: Nurse Practitioner

## 2012-12-09 DIAGNOSIS — Z23 Encounter for immunization: Secondary | ICD-10-CM | POA: Diagnosis not present

## 2013-01-21 DIAGNOSIS — R928 Other abnormal and inconclusive findings on diagnostic imaging of breast: Secondary | ICD-10-CM | POA: Diagnosis not present

## 2013-01-21 DIAGNOSIS — Z09 Encounter for follow-up examination after completed treatment for conditions other than malignant neoplasm: Secondary | ICD-10-CM | POA: Diagnosis not present

## 2013-05-12 DIAGNOSIS — N183 Chronic kidney disease, stage 3 unspecified: Secondary | ICD-10-CM | POA: Diagnosis not present

## 2013-05-12 DIAGNOSIS — E7800 Pure hypercholesterolemia, unspecified: Secondary | ICD-10-CM | POA: Diagnosis not present

## 2013-05-12 DIAGNOSIS — N058 Unspecified nephritic syndrome with other morphologic changes: Secondary | ICD-10-CM | POA: Diagnosis not present

## 2013-05-12 DIAGNOSIS — M810 Age-related osteoporosis without current pathological fracture: Secondary | ICD-10-CM | POA: Diagnosis not present

## 2013-05-12 DIAGNOSIS — N318 Other neuromuscular dysfunction of bladder: Secondary | ICD-10-CM | POA: Diagnosis not present

## 2013-05-12 DIAGNOSIS — Z23 Encounter for immunization: Secondary | ICD-10-CM | POA: Diagnosis not present

## 2013-05-12 DIAGNOSIS — Z0000 Encounter for general adult medical examination without abnormal findings: Secondary | ICD-10-CM | POA: Diagnosis not present

## 2013-05-12 DIAGNOSIS — I129 Hypertensive chronic kidney disease with stage 1 through stage 4 chronic kidney disease, or unspecified chronic kidney disease: Secondary | ICD-10-CM | POA: Diagnosis not present

## 2013-05-12 DIAGNOSIS — E1129 Type 2 diabetes mellitus with other diabetic kidney complication: Secondary | ICD-10-CM | POA: Diagnosis not present

## 2013-05-31 ENCOUNTER — Encounter (HOSPITAL_COMMUNITY): Payer: Self-pay | Admitting: Emergency Medicine

## 2013-05-31 ENCOUNTER — Emergency Department (HOSPITAL_COMMUNITY): Payer: Medicare Other

## 2013-05-31 ENCOUNTER — Inpatient Hospital Stay (HOSPITAL_COMMUNITY)
Admission: EM | Admit: 2013-05-31 | Discharge: 2013-06-03 | DRG: 440 | Disposition: A | Discharge status: Home / Self Care | Payer: Medicare Other | Attending: Internal Medicine | Admitting: Internal Medicine

## 2013-05-31 DIAGNOSIS — Z7902 Long term (current) use of antithrombotics/antiplatelets: Secondary | ICD-10-CM

## 2013-05-31 DIAGNOSIS — Z8673 Personal history of transient ischemic attack (TIA), and cerebral infarction without residual deficits: Secondary | ICD-10-CM | POA: Diagnosis not present

## 2013-05-31 DIAGNOSIS — F0390 Unspecified dementia without behavioral disturbance: Secondary | ICD-10-CM | POA: Diagnosis present

## 2013-05-31 DIAGNOSIS — K8070 Calculus of gallbladder and bile duct without cholecystitis without obstruction: Secondary | ICD-10-CM | POA: Diagnosis present

## 2013-05-31 DIAGNOSIS — K219 Gastro-esophageal reflux disease without esophagitis: Secondary | ICD-10-CM | POA: Diagnosis present

## 2013-05-31 DIAGNOSIS — D649 Anemia, unspecified: Secondary | ICD-10-CM

## 2013-05-31 DIAGNOSIS — I10 Essential (primary) hypertension: Secondary | ICD-10-CM | POA: Diagnosis not present

## 2013-05-31 DIAGNOSIS — K8050 Calculus of bile duct without cholangitis or cholecystitis without obstruction: Secondary | ICD-10-CM | POA: Diagnosis not present

## 2013-05-31 DIAGNOSIS — E785 Hyperlipidemia, unspecified: Secondary | ICD-10-CM | POA: Diagnosis present

## 2013-05-31 DIAGNOSIS — E119 Type 2 diabetes mellitus without complications: Secondary | ICD-10-CM | POA: Diagnosis not present

## 2013-05-31 DIAGNOSIS — R109 Unspecified abdominal pain: Secondary | ICD-10-CM | POA: Diagnosis not present

## 2013-05-31 DIAGNOSIS — D509 Iron deficiency anemia, unspecified: Secondary | ICD-10-CM | POA: Diagnosis present

## 2013-05-31 DIAGNOSIS — R7402 Elevation of levels of lactic acid dehydrogenase (LDH): Secondary | ICD-10-CM | POA: Diagnosis not present

## 2013-05-31 DIAGNOSIS — Z888 Allergy status to other drugs, medicaments and biological substances status: Secondary | ICD-10-CM

## 2013-05-31 DIAGNOSIS — R209 Unspecified disturbances of skin sensation: Secondary | ICD-10-CM

## 2013-05-31 DIAGNOSIS — Z79899 Other long term (current) drug therapy: Secondary | ICD-10-CM

## 2013-05-31 DIAGNOSIS — K8020 Calculus of gallbladder without cholecystitis without obstruction: Secondary | ICD-10-CM | POA: Diagnosis present

## 2013-05-31 DIAGNOSIS — K851 Biliary acute pancreatitis without necrosis or infection: Secondary | ICD-10-CM | POA: Diagnosis present

## 2013-05-31 DIAGNOSIS — R7401 Elevation of levels of liver transaminase levels: Secondary | ICD-10-CM | POA: Diagnosis not present

## 2013-05-31 DIAGNOSIS — K859 Acute pancreatitis without necrosis or infection, unspecified: Secondary | ICD-10-CM | POA: Diagnosis not present

## 2013-05-31 DIAGNOSIS — N179 Acute kidney failure, unspecified: Secondary | ICD-10-CM

## 2013-05-31 DIAGNOSIS — G459 Transient cerebral ischemic attack, unspecified: Secondary | ICD-10-CM | POA: Diagnosis present

## 2013-05-31 HISTORY — DX: Iron deficiency anemia, unspecified: D50.9

## 2013-05-31 LAB — URINALYSIS, ROUTINE W REFLEX MICROSCOPIC
BILIRUBIN URINE: NEGATIVE
Glucose, UA: NEGATIVE mg/dL
Hgb urine dipstick: NEGATIVE
KETONES UR: NEGATIVE mg/dL
NITRITE: NEGATIVE
Protein, ur: 30 mg/dL — AB
Specific Gravity, Urine: 1.017 (ref 1.005–1.030)
UROBILINOGEN UA: 0.2 mg/dL (ref 0.0–1.0)
pH: 6 (ref 5.0–8.0)

## 2013-05-31 LAB — COMPREHENSIVE METABOLIC PANEL
ALK PHOS: 103 U/L (ref 39–117)
ALT: 205 U/L — ABNORMAL HIGH (ref 0–35)
AST: 208 U/L — ABNORMAL HIGH (ref 0–37)
Albumin: 3.5 g/dL (ref 3.5–5.2)
BILIRUBIN TOTAL: 0.3 mg/dL (ref 0.3–1.2)
BUN: 20 mg/dL (ref 6–23)
CHLORIDE: 98 meq/L (ref 96–112)
CO2: 24 mEq/L (ref 19–32)
CREATININE: 0.92 mg/dL (ref 0.50–1.10)
Calcium: 9.6 mg/dL (ref 8.4–10.5)
GFR calc non Af Amer: 54 mL/min — ABNORMAL LOW (ref >90)
GFR, EST AFRICAN AMERICAN: 63 mL/min — AB (ref >90)
GLUCOSE: 91 mg/dL (ref 70–99)
POTASSIUM: 3.8 meq/L (ref 3.7–5.3)
Sodium: 137 mEq/L (ref 137–147)
TOTAL PROTEIN: 7.3 g/dL (ref 6.0–8.3)

## 2013-05-31 LAB — CBC WITH DIFFERENTIAL/PLATELET
Basophils Absolute: 0 10*3/uL (ref 0.0–0.1)
Basophils Relative: 1 % (ref 0–1)
EOS ABS: 0.1 10*3/uL (ref 0.0–0.7)
Eosinophils Relative: 2 % (ref 0–5)
HEMATOCRIT: 27.1 % — AB (ref 36.0–46.0)
HEMOGLOBIN: 8.4 g/dL — AB (ref 12.0–15.0)
LYMPHS ABS: 1.4 10*3/uL (ref 0.7–4.0)
Lymphocytes Relative: 16 % (ref 12–46)
MCH: 21.9 pg — AB (ref 26.0–34.0)
MCHC: 31 g/dL (ref 30.0–36.0)
MCV: 70.8 fL — ABNORMAL LOW (ref 78.0–100.0)
MONO ABS: 0.9 10*3/uL (ref 0.1–1.0)
MONOS PCT: 10 % (ref 3–12)
NEUTROS PCT: 71 % (ref 43–77)
Neutro Abs: 6.2 10*3/uL (ref 1.7–7.7)
Platelets: 292 10*3/uL (ref 150–400)
RBC: 3.83 MIL/uL — AB (ref 3.87–5.11)
RDW: 17.5 % — ABNORMAL HIGH (ref 11.5–15.5)
WBC: 8.7 10*3/uL (ref 4.0–10.5)

## 2013-05-31 LAB — GLUCOSE, CAPILLARY
GLUCOSE-CAPILLARY: 99 mg/dL (ref 70–99)
Glucose-Capillary: 89 mg/dL (ref 70–99)
Glucose-Capillary: 89 mg/dL (ref 70–99)

## 2013-05-31 LAB — URINE MICROSCOPIC-ADD ON

## 2013-05-31 LAB — LIPASE, BLOOD

## 2013-05-31 MED ORDER — INSULIN ASPART 100 UNIT/ML ~~LOC~~ SOLN
0.0000 [IU] | SUBCUTANEOUS | Status: DC
Start: 2013-05-31 — End: 2013-06-02
  Administered 2013-06-01 (×2): 1 [IU] via SUBCUTANEOUS
  Administered 2013-06-01 – 2013-06-02 (×2): 3 [IU] via SUBCUTANEOUS

## 2013-05-31 MED ORDER — ONDANSETRON HCL 4 MG PO TABS: 4.0000 mg | ORAL_TABLET | Freq: Four times a day (QID) | ORAL | Status: DC | PRN

## 2013-05-31 MED ORDER — SODIUM CHLORIDE 0.9 % IV SOLN
INTRAVENOUS | Status: DC
  Administered 2013-05-31: 15:00:00 via INTRAVENOUS

## 2013-05-31 MED ORDER — ENOXAPARIN SODIUM 40 MG/0.4ML ~~LOC~~ SOLN
40.0000 mg | SUBCUTANEOUS | Status: DC
  Administered 2013-05-31 – 2013-06-02 (×3): 40 mg via SUBCUTANEOUS
  Filled 2013-05-31 (×4): qty 0.4

## 2013-05-31 MED ORDER — AMLODIPINE BESYLATE 10 MG PO TABS
10.0000 mg | ORAL_TABLET | Freq: Every day | ORAL | Status: DC
  Administered 2013-05-31 – 2013-06-03 (×4): 10 mg via ORAL
  Filled 2013-05-31 (×4): qty 1

## 2013-05-31 MED ORDER — CHLORHEXIDINE GLUCONATE 0.12 % MT SOLN
15.0000 mL | Freq: Two times a day (BID) | OROMUCOSAL | Status: DC
  Administered 2013-05-31 – 2013-06-01 (×3): 15 mL via OROMUCOSAL
  Filled 2013-05-31 (×6): qty 15

## 2013-05-31 MED ORDER — ACETAMINOPHEN 650 MG RE SUPP: 650.0000 mg | Freq: Four times a day (QID) | RECTAL | Status: DC | PRN

## 2013-05-31 MED ORDER — IRBESARTAN 300 MG PO TABS
300.0000 mg | ORAL_TABLET | Freq: Every day | ORAL | Status: DC
  Administered 2013-05-31 – 2013-06-03 (×4): 300 mg via ORAL
  Filled 2013-05-31 (×4): qty 1

## 2013-05-31 MED ORDER — COLESEVELAM HCL 3.75 G PO PACK: 1.0000 | PACK | Freq: Every day | ORAL | Status: DC

## 2013-05-31 MED ORDER — BIOTENE DRY MOUTH MT LIQD
15.0000 mL | Freq: Two times a day (BID) | OROMUCOSAL | Status: DC
  Administered 2013-05-31 – 2013-06-01 (×3): 15 mL via OROMUCOSAL

## 2013-05-31 MED ORDER — OMEGA-3 FATTY ACIDS 1000 MG PO CAPS: 1.0000 g | ORAL_CAPSULE | Freq: Every day | ORAL | Status: DC

## 2013-05-31 MED ORDER — ALBUTEROL SULFATE (2.5 MG/3ML) 0.083% IN NEBU: 2.5000 mg | INHALATION_SOLUTION | RESPIRATORY_TRACT | Status: DC | PRN

## 2013-05-31 MED ORDER — PANTOPRAZOLE SODIUM 40 MG PO TBEC
40.0000 mg | DELAYED_RELEASE_TABLET | Freq: Every day | ORAL | Status: DC
  Administered 2013-05-31 – 2013-06-03 (×4): 40 mg via ORAL
  Filled 2013-05-31 (×4): qty 1

## 2013-05-31 MED ORDER — MORPHINE SULFATE 2 MG/ML IJ SOLN: 1.0000 mg | INTRAMUSCULAR | Status: DC | PRN

## 2013-05-31 MED ORDER — ACETAMINOPHEN 325 MG PO TABS: 650.0000 mg | ORAL_TABLET | Freq: Four times a day (QID) | ORAL | Status: DC | PRN

## 2013-05-31 MED ORDER — COLESEVELAM HCL 625 MG PO TABS
1875.0000 mg | ORAL_TABLET | Freq: Two times a day (BID) | ORAL | Status: DC
  Filled 2013-05-31 (×2): qty 3

## 2013-05-31 MED ORDER — HYDRALAZINE HCL 20 MG/ML IJ SOLN
10.0000 mg | Freq: Four times a day (QID) | INTRAMUSCULAR | Status: DC | PRN
  Filled 2013-05-31: qty 1

## 2013-05-31 MED ORDER — SODIUM CHLORIDE 0.9 % IV SOLN
INTRAVENOUS | Status: DC
  Administered 2013-05-31: 17:00:00 via INTRAVENOUS

## 2013-05-31 MED ORDER — ONDANSETRON HCL 4 MG/2ML IJ SOLN: 4.0000 mg | Freq: Four times a day (QID) | INTRAMUSCULAR | Status: DC | PRN

## 2013-05-31 MED ORDER — OMEGA-3-ACID ETHYL ESTERS 1 G PO CAPS: 1.0000 g | ORAL_CAPSULE | Freq: Every day | ORAL | Status: DC

## 2013-05-31 NOTE — ED Notes (Signed)
Onset last night pt started having mid chest pain.  EMS called, EKG done and was normal.  Was advised if pain gets worse.  Pt took hydrocodone and was able to sleep.  This morning pain is across upper abd.  Denies nausea or other s/s.  Pt called PCP this morning and was advised to go to ED.

## 2013-05-31 NOTE — ED Notes (Signed)
Daughter stated, she is probably having a gallbladder attack, she started having chest pain like indigestion and then wen to her stomach.  Called Dr. And said bring her to Er for an ultrasound.

## 2013-05-31 NOTE — H&P (Signed)
History and Physical  Nicole Villa:[id number] DOB: August 23, 1922 DOA: 05/31/2013  Referring physician: EDP PCP: [name], MD  Outpatient Specialists:  1. None  Chief Complaint: Abdominal pain.  HPI: Nicole Villa is a 80 y.o. female with history of gallstones, type II DM, HTN, GERD, HLD, anemia, dementia, presented to the ED with complaints of abdominal pain. Patient unable to provide significant history secondary to dementia. History obtained from patient's 2 daughters are at bedside. She was in her usual state of health until 7 PM on 05/30/13 when she first complained of pain across upper abdomen with no associated nausea, vomiting, fever, chills, diarrhea or constipation. They thought that it was indigestion and gave her 2 tablets of TUMS and patient went to bed. At 9 PM, patient again complained of significant abdominal pain. EMS was called who came and evaluated patient. EKG was said to be normal. Systolic blood pressure was in the 200 mmHg range. They indicated that this was most likely GI in origin. Patient received a pain pill that she had taken during prior such episode a year and half ago. This helped her pain and she went to sleep. Symptoms recurred at 5 AM today. PCP was contacted and patient was advised to come to the ED. In the ED, lipase >3000, AST 208, ALT 205, hemoglobin 8.4 and abdominal ultrasound shows cholelithiasis but nondilated CBD. Hospitalist admission requested.   Review of Systems: All systems reviewed and apart from history of presenting illness, are negative.  Past Medical History  Diagnosis Date  . Gallstones   . Diabetes mellitus without complication   . Hypertension   . Arthritis   . Heart murmur   . GERD (gastroesophageal reflux disease)   . Hyperlipemia    History reviewed. No pertinent past surgical history. Social History:  reports that she has never smoked. She has never used smokeless tobacco. She reports that she does not drink  alcohol or use illicit drugs. Married. Lives with spouse. Independent of activities of daily living.  Allergies  Allergen Reactions  . Lisinopril Cough    No family history on file. negative family history.  Prior to Admission medications   Medication Sig Start Date End Date Taking? Authorizing Provider  acetaminophen (TYLENOL) 325 MG tablet Take 650 mg by mouth at bedtime.   Yes Historical Provider, MD  amLODipine (NORVASC) 10 MG tablet Take 10 mg by mouth daily.   Yes Historical Provider, MD  beta carotene w/minerals (OCUVITE) tablet Take 1 tablet by mouth daily.   Yes Historical Provider, MD  cholecalciferol (VITAMIN D) 1000 UNITS tablet Take 1,000 Units by mouth daily.   Yes Historical Provider, MD  clopidogrel (PLAVIX) 75 MG tablet Take 1 tablet (75 mg total) by mouth daily with breakfast. 05/09/12  Yes Osvaldo Shipper, MD  Colesevelam HCl [facility]) 3.75 G PACK Take 1 packet by mouth daily with breakfast.   Yes Historical Provider, MD  fish oil-omega-3 fatty acids 1000 MG capsule Take 1 g by mouth daily.   Yes Historical Provider, MD  glimepiride (AMARYL) 1 MG tablet Take 0.5 mg by mouth See admin instructions. Take a 1/2 tablet with the first meal of the day orally twice daily   Yes Historical Provider, MD  nabumetone (RELAFEN) 500 MG tablet Take 500 mg by mouth daily.   Yes Historical Provider, MD  pantoprazole (PROTONIX) 40 MG tablet Take 40 mg by mouth daily.   Yes Historical Provider, MD  pravastatin (PRAVACHOL) 20 MG tablet Take 20 mg  by mouth daily.   Yes Historical Provider, MD  valsartan-hydrochlorothiazide (DIOVAN-HCT) 320-12.5 MG per tablet Take 1 tablet by mouth daily.   Yes Historical Provider, MD   Physical Exam: Filed Vitals:   05/31/13 1123 05/31/13 1200 05/31/13 1400 05/31/13 1535  BP:  180/79 168/69 190/90  Pulse: 100 101 97 107  Temp:    98 F (36.7 C)  TempSrc:    Oral  Resp:  16 16 18   Height:    4\' 11"  (1.499 m)  Weight:    54.522 kg (120 lb 3.2 oz)  SpO2:  97% 97% 95%      General exam: Moderately built and nourished pleasant elderly female patient, lying comfortably supine on the bed in no obvious distress.  Head, eyes and ENT: Nontraumatic and normocephalic. Pupils equally reacting to light and accommodation. Oral mucosa moist.  Neck: Supple. No JVD, carotid bruit or thyromegaly.  Lymphatics: No lymphadenopathy.  Respiratory system: Clear to auscultation. No increased work of breathing.  Cardiovascular system: S1 and S2 heard, RRR. No JVD, murmurs, gallops, clicks or pedal edema.  Gastrointestinal system: Abdomen is nondistended, soft and nontender. Normal bowel sounds heard. No organomegaly or masses appreciated.  Central nervous system: Alert and oriented to self and place only. No focal neurological deficits.  Extremities: Symmetric 5 x 5 power. Peripheral pulses symmetrically felt.   Skin: No rashes or acute findings.  Musculoskeletal system: Negative exam.  Psychiatry: Pleasant and cooperative.   Labs on Admission:  Basic Metabolic Panel:  Recent Labs Lab 05/31/13 1128  NA 137  K 3.8  CL 98  CO2 24  GLUCOSE 91  BUN 20  CREATININE 0.92  CALCIUM 9.6   Liver Function Tests:  Recent Labs Lab 05/31/13 1128  AST 208*  ALT 205*  ALKPHOS 103  BILITOT 0.3  PROT 7.3  ALBUMIN 3.5    Recent Labs Lab 05/31/13 1128  LIPASE >3000*   No results found for this basename: AMMONIA,  in the last 168 hours CBC:  Recent Labs Lab 05/31/13 1128  WBC 8.7  NEUTROABS 6.2  HGB 8.4*  HCT 27.1*  MCV 70.8*  PLT 292   Cardiac Enzymes: No results found for this basename: CKTOTAL, CKMB, CKMBINDEX, TROPONINI,  in the last 168 hours  BNP (last 3 results) No results found for this basename: PROBNP,  in the last 8760 hours CBG:  Recent Labs Lab 05/31/13 1555  GLUCAP 89    Radiological Exams on Admission: US Abdomen Complete  05/31/2013   CLINICAL DATA:  Right upper quadrant pain  EXAM: ULTRASOUND ABDOMEN  COMPLETE  COMPARISON:  02/25/2012  FINDINGS: Gallbladder:  Cholelithiasis. The gallbladder wall is at the upper limits of normal. A negative sonographic Eulah Pont sign is noted.  Common bile duct:  Diameter: 4 mm  Liver:  No focal lesion identified. Within normal limits in parenchymal echogenicity.  IVC:  No abnormality visualized.  Pancreas:  Visualized portion unremarkable.  Spleen:  Size and appearance within normal limits.  Right Kidney:  Length: 10 cm. Echogenicity within normal limits. No mass or hydronephrosis visualized.  Left Kidney:  Length: 10 cm. Echogenicity within normal limits. No mass or hydronephrosis visualized.  Abdominal aorta:  Atherosclerotic changes are noted without aneurysmal dilatation.  Other findings:  None.  IMPRESSION: Cholelithiasis   Electronically Signed   By: Alcide Clever M.D.   On: 05/31/2013 14:06      Assessment/Plan Principal Problem:   Acute biliary pancreatitis Active Problems:   Hypertension   Diabetes mellitus  Anemia, unspecified   TIA (transient ischemic attack)   Presenile dementia, uncomplicated   Cholelithiasis   1. Acute biliary pancreatitis: As evidenced by lipase >3000, mild transaminitis, gallstones on abdominal ultrasound. CBD is not dilated and hence could be secondary to passed stone or biliary sludge. Patient had similar presentation approximately year and half ago and was managed conservatively. Admit to medical floor. Keep n.p.o. except meds and ice chips. Hold anti-HLD medications. Consult GI-? ERCP/sphincterotomy. Hold Plavix in case she were to undergo EGD. Follow CMP and lipase in a.m. Probably not a candidate for cholecystectomy. 2. Uncontrolled hypertension: Secondary to not taking her medications today. Resume amlodipine and ARB. Hold HCTZ secondary to pancreatitis. 3. Microcytic anemia: Hemoglobin at baseline is in the 10- 11 g per DL range. No overt bleeding. Follow CBC in a.m. and transfuse if hemoglobin less than 7 g per  DL. 4. Type II DM: Hold oral hypoglycemics and place on sensitive sliding scale insulin. 5. History of TIA: Hold Plavix 6. History of hyperlipidemia: Hold medications secondary to pancreatitis. 7. History of dementia: Mental status at baseline.     Code Status: Full  Family Communication: Discussed with patients daughter/health care power of attorney Ms. Inetta Fermoina Page and next daughter Ms. Loyal Jacobsonobin Perkins at bedside.  Disposition Plan: Home when medically stable.   Time spent: 55 minutes  Raevyn Sokol, MD, FACP, FHM. [facility] Pager [phone]  If 7PM-7AM, please contact night-coverage [email or web address] Password [email or web address] 05/31/2013, 4:32 PM

## 2013-05-31 NOTE — ED Notes (Signed)
Pt returned from US

## 2013-05-31 NOTE — ED Notes (Signed)
Pt reports urinary frequency last night.  No dysuria.

## 2013-05-31 NOTE — Progress Notes (Signed)
Nicole BibleCatherine M Villa [id number] Code Status:  Prior  Admission Data: 05/31/2013 4:04 PM Attending Provider:  Hongalgi WUJ:WJXB,JYNWGNFAOPCP:SHAW,KIMBERLEE, MD Consults/ Treatment Team: [facility]  Nicole BibleCatherine M Villa is a 80 y.o. female patient admitted from ED awake, alert - oriented  X 3 - no acute distress noted.  VSS - Blood pressure 190/90, pulse 107, temperature 98 F (36.7 Villa), temperature source Oral, resp. rate 18, height 4\' 11"  (1.499 m), weight 54.522 kg (120 lb 3.2 oz), SpO2 95.00%.  no Villa/o shortness of breath, no Villa/o chest pain. MD aware of BP.O2:  n/a IV Fluids:  IV in place, occlusive dsg intact without redness, IV cath forearm right, condition patent and no redness normal saline.  Allergies:   Allergies  Allergen Reactions  . Lisinopril Cough     Past Medical History  Diagnosis Date  . Gallstones   . Diabetes mellitus without complication   . Hypertension   . Arthritis   . Heart murmur   . GERD (gastroesophageal reflux disease)   . Hyperlipemia    Medications Prior to Admission  Medication Sig Dispense Refill  . acetaminophen (TYLENOL) 325 MG tablet Take 650 mg by mouth at bedtime.      Marland Kitchen. amLODipine (NORVASC) 10 MG tablet Take 10 mg by mouth daily.      . beta carotene w/minerals (OCUVITE) tablet Take 1 tablet by mouth daily.      . cholecalciferol (VITAMIN D) 1000 UNITS tablet Take 1,000 Units by mouth daily.      . clopidogrel (PLAVIX) 75 MG tablet Take 1 tablet (75 mg total) by mouth daily with breakfast.  30 tablet  1  . Colesevelam HCl (WELCHOL) 3.75 G PACK Take 1 packet by mouth daily with breakfast.      . fish oil-omega-3 fatty acids 1000 MG capsule Take 1 g by mouth daily.      Marland Kitchen. glimepiride (AMARYL) 1 MG tablet Take 0.5 mg by mouth See admin instructions. Take a 1/2 tablet with the first meal of the day orally twice daily      . nabumetone (RELAFEN) 500 MG tablet Take 500 mg by mouth daily.      . pantoprazole (PROTONIX) 40 MG tablet Take 40 mg by mouth daily.      .  pravastatin (PRAVACHOL) 20 MG tablet Take 20 mg by mouth daily.      . valsartan-hydrochlorothiazide (DIOVAN-HCT) 320-12.5 MG per tablet Take 1 tablet by mouth daily.       History:  obtained from the patient. Tobacco/alcohol: denied none  Orientation to room, and floor completed with information packet given to patient/family.  Patient declined safety video at this time.  Admission INP armband ID verified with patient/family, and in place.   SR up x 2, fall assessment complete, with patient and family able to verbalize understanding of risk associated with falls, and verbalized understanding to call nsg before up out of bed.  Call light within reach, patient able to voice, and demonstrate understanding.  Skin, clean-dry- intact without evidence of bruising, or skin tears.   No evidence of skin break down noted on exam.     Will cont to eval and treat per MD orders.  Aldean AstLEsperance, Nicole Cookston C, RN 05/31/2013 4:04 PM

## 2013-05-31 NOTE — Progress Notes (Signed)
MD notified of patients arrival to unit and BP 190/90. Patient asymptomatic.

## 2013-05-31 NOTE — Progress Notes (Signed)
2:45 PM Report from ED for admission to [phone]

## 2013-05-31 NOTE — ED Notes (Signed)
Patient transported to Ultrasound 

## 2013-05-31 NOTE — ED Provider Notes (Signed)
CSN: [id number]     Arrival date & time 05/31/13  1029 History   First MD Initiated Contact with Patient 05/31/13 1114     Chief Complaint  Patient presents with  . Abdominal Pain     (Consider location/radiation/quality/duration/timing/severity/associated sxs/prior Treatment) Patient is a 80 y.o. female presenting with abdominal pain.  Abdominal Pain  Pt with known history of gallstones began to have upper abdominal pain last night. No vomiting or diarrhea. Improved with hydrocodone and she was able to sleep but pain returned this morning. Daughter gave her another hydrocodone before bringing her to the ED and pain improved again.   Past Medical History  Diagnosis Date  . Gallstones   . Diabetes mellitus without complication   . Hypertension   . Arthritis   . Heart murmur   . GERD (gastroesophageal reflux disease)   . Hyperlipemia    History reviewed. No pertinent past surgical history. No family history on file. History  Substance Use Topics  . Smoking status: Never Smoker   . Smokeless tobacco: Never Used  . Alcohol Use: No   OB History   Grav Para Term Preterm Abortions TAB SAB Ect Mult Living                 Review of Systems  Gastrointestinal: Positive for abdominal pain.    All other systems reviewed and are negative except as noted in HPI.    Allergies  Lisinopril  Home Medications   Current Outpatient Rx  Name  Route  Sig  Dispense  Refill  . alendronate (FOSAMAX) 70 MG tablet   Oral   Take 70 mg by mouth every 7 (seven) days. Take with a full glass of water on an empty stomach.         Marland Kitchen. amLODipine (NORVASC) 10 MG tablet   Oral   Take 10 mg by mouth daily.         . beta carotene w/minerals (OCUVITE) tablet   Oral   Take 1 tablet by mouth daily.         . cholecalciferol (VITAMIN D) 1000 UNITS tablet   Oral   Take 1,000 Units by mouth daily.         . clopidogrel (PLAVIX) 75 MG tablet   Oral   Take 1 tablet (75 mg total) by mouth  daily with breakfast.   30 tablet   1   . Colesevelam HCl (WELCHOL) 3.75 G PACK   Oral   Take 1 packet by mouth daily with breakfast.         . ezetimibe (ZETIA) 10 MG tablet   Oral   Take 10 mg by mouth daily.         . fish oil-omega-3 fatty acids 1000 MG capsule   Oral   Take 1 g by mouth daily.         . nabumetone (RELAFEN) 500 MG tablet   Oral   Take 500 mg by mouth daily.         . pantoprazole (PROTONIX) 40 MG tablet   Oral   Take 40 mg by mouth daily.         . pravastatin (PRAVACHOL) 20 MG tablet   Oral   Take 20 mg by mouth daily.         . valsartan-hydrochlorothiazide (DIOVAN-HCT) 320-12.5 MG per tablet   Oral   Take 1 tablet by mouth daily.          BP 136/49  Pulse 87  Temp(Src) 98.1 F (36.7 C) (Oral)  Resp 18  Ht 4\' 11"  (1.499 m)  Wt 128 lb (58.06 kg)  BMI 25.84 kg/m2  SpO2 97% Physical Exam  Nursing note and vitals reviewed. Constitutional: She is oriented to person, place, and time. She appears well-developed and well-nourished.  HENT:  Head: Normocephalic and atraumatic.  Eyes: EOM are normal. Pupils are equal, round, and reactive to light.  Neck: Normal range of motion. Neck supple.  Cardiovascular: Normal rate, normal heart sounds and intact distal pulses.   Pulmonary/Chest: Effort normal and breath sounds normal.  Abdominal: Bowel sounds are normal. She exhibits no distension. There is tenderness (mild RUQ and epigastric pain, neg Murphy's sign). There is no rebound and no guarding.  Musculoskeletal: Normal range of motion. She exhibits no edema and no tenderness.  Neurological: She is alert and oriented to person, place, and time. She has normal strength. No cranial nerve deficit or sensory deficit.  Skin: Skin is warm and dry. No rash noted.  Psychiatric: She has a normal mood and affect.    ED Course  Procedures (including critical care time) Labs Review Labs Reviewed  CBC WITH DIFFERENTIAL - Abnormal; Notable for  the following:    RBC 3.83 (*)    Hemoglobin 8.4 (*)    HCT 27.1 (*)    MCV 70.8 (*)    MCH 21.9 (*)    RDW 17.5 (*)    All other components within normal limits  COMPREHENSIVE METABOLIC PANEL - Abnormal; Notable for the following:    AST 208 (*)    ALT 205 (*)    GFR calc non Af Amer 54 (*)    GFR calc Af Amer 63 (*)    All other components within normal limits  LIPASE, BLOOD - Abnormal; Notable for the following:    Lipase >3000 (*)    All other components within normal limits  URINALYSIS, ROUTINE W REFLEX MICROSCOPIC - Abnormal; Notable for the following:    Protein, ur 30 (*)    Leukocytes, UA SMALL (*)    All other components within normal limits  URINE MICROSCOPIC-ADD ON - Abnormal; Notable for the following:    Squamous Epithelial / LPF MANY (*)    Bacteria, UA FEW (*)    All other components within normal limits  GLUCOSE, CAPILLARY  GLUCOSE, CAPILLARY  COMPREHENSIVE METABOLIC PANEL  CBC  LIPASE, BLOOD   Imaging Review US Abdomen Complete  05/31/2013   CLINICAL DATA:  Right upper quadrant pain  EXAM: ULTRASOUND ABDOMEN COMPLETE  COMPARISON:  02/25/2012  FINDINGS: Gallbladder:  Cholelithiasis. The gallbladder wall is at the upper limits of normal. A negative sonographic Eulah Pont sign is noted.  Common bile duct:  Diameter: 4 mm  Liver:  No focal lesion identified. Within normal limits in parenchymal echogenicity.  IVC:  No abnormality visualized.  Pancreas:  Visualized portion unremarkable.  Spleen:  Size and appearance within normal limits.  Right Kidney:  Length: 10 cm. Echogenicity within normal limits. No mass or hydronephrosis visualized.  Left Kidney:  Length: 10 cm. Echogenicity within normal limits. No mass or hydronephrosis visualized.  Abdominal aorta:  Atherosclerotic changes are noted without aneurysmal dilatation.  Other findings:  None.  IMPRESSION: Cholelithiasis   Electronically Signed   By: Alcide Clever M.D.   On: 05/31/2013 14:06     EKG Interpretation None       MDM   Final diagnoses:  Pancreatitis    Pt with probable gall stone  pancreatitis, pain well controlled now. Spoke with Hospitalist and Dr. Bosie Clos.     Charles B. Bernette Mayers, MD 05/31/13 [phone]

## 2013-05-31 NOTE — Consult Note (Addendum)
Referring Provider: Dr. Bernette MayersSheldon Primary Care Physician:  Lupita RaiderSHAW,KIMBERLEE, MD Primary Gastroenterologist:  Gentry FitzUnassigned  Reason for Consultation:  Pancreatitis  HPI: Nicole Villa is a 80 y.o. female who had the acute onset of abdominal pain across her upper abdomen that started yesterday evening and persisted. One episode of nausea and tried to vomit but could not. History obtained from daughters because patient does not recall the extent of the pain. Daughter reports that the ECG was normal and her BP was elevated around 200 systolic. Pain settled down with pain pill and then occurred again early this morning and she came to the hospital. Lipase > 3000, AST 208, ALT 205 with normal TB and ALP. U/S showed gallstones with normal sized CBD and no cholecystitis. Patient feels ok right now and denies abdominal pain. She is on Plavix for TIAs. Marland Kitchen.    Past Medical History  Diagnosis Date  . Gallstones   . Diabetes mellitus without complication   . Hypertension   . Arthritis   . Heart murmur   . GERD (gastroesophageal reflux disease)   . Hyperlipemia     History reviewed. No pertinent past surgical history.  Prior to Admission medications   Medication Sig Start Date End Date Taking? Authorizing Provider  acetaminophen (TYLENOL) 325 MG tablet Take 650 mg by mouth at bedtime.   Yes Historical Provider, MD  amLODipine (NORVASC) 10 MG tablet Take 10 mg by mouth daily.   Yes Historical Provider, MD  beta carotene w/minerals (OCUVITE) tablet Take 1 tablet by mouth daily.   Yes Historical Provider, MD  cholecalciferol (VITAMIN D) 1000 UNITS tablet Take 1,000 Units by mouth daily.   Yes Historical Provider, MD  clopidogrel (PLAVIX) 75 MG tablet Take 1 tablet (75 mg total) by mouth daily with breakfast. 05/09/12  Yes Osvaldo ShipperGokul Krishnan, MD  Colesevelam HCl [facility](WELCHOL) 3.75 G PACK Take 1 packet by mouth daily with breakfast.   Yes Historical Provider, MD  fish oil-omega-3 fatty acids 1000 MG capsule Take  1 g by mouth daily.   Yes Historical Provider, MD  glimepiride (AMARYL) 1 MG tablet Take 0.5 mg by mouth See admin instructions. Take a 1/2 tablet with the first meal of the day orally twice daily   Yes Historical Provider, MD  nabumetone (RELAFEN) 500 MG tablet Take 500 mg by mouth daily.   Yes Historical Provider, MD  pantoprazole (PROTONIX) 40 MG tablet Take 40 mg by mouth daily.   Yes Historical Provider, MD  pravastatin (PRAVACHOL) 20 MG tablet Take 20 mg by mouth daily.   Yes Historical Provider, MD  valsartan-hydrochlorothiazide (DIOVAN-HCT) 320-12.5 MG per tablet Take 1 tablet by mouth daily.   Yes Historical Provider, MD    Scheduled Meds: . amLODipine  10 mg Oral Daily  . enoxaparin (LOVENOX) injection  40 mg Subcutaneous Q24H  . insulin aspart  0-9 Units Subcutaneous 6 times per day  . irbesartan  300 mg Oral Daily  . pantoprazole  40 mg Oral Daily   Continuous Infusions: . sodium chloride 50 mL/hr at 05/31/13 1708   PRN Meds:.acetaminophen, acetaminophen, albuterol, hydrALAZINE, morphine injection, ondansetron (ZOFRAN) IV, ondansetron  Allergies as of 05/31/2013 - Review Complete 05/31/2013  Allergen Reaction Noted  . Lisinopril Cough 05/07/2012    No family history on file.  History   Social History  . Marital Status: Married    Spouse Name: N/A    Number of Children: N/A  . Years of Education: N/A   Occupational History  . Not on  file.   Social History Main Topics  . Smoking status: Never Smoker   . Smokeless tobacco: Never Used  . Alcohol Use: No  . Drug Use: No  . Sexual Activity: Not on file   Other Topics Concern  . Not on file   Social History Narrative  . No narrative on file    Review of Systems: All negative except as stated above in HPI.  Physical Exam: Vital signs: Filed Vitals:   05/31/13 1535  BP: 190/90  Pulse: 107  Temp: 98 F (36.7 C)  Resp: 18   Last BM Date: 05/30/13 General:   Elderly, demented, awake, no acute distress,  pleasant HEENT: anicteric Neck: supple, nontender Lungs:  Clear throughout to auscultation.   No wheezes, crackles, or rhonchi. No acute distress. Heart:  Regular rate and rhythm; no murmurs, clicks, rubs,  or gallops. Abdomen: diffusely tender with guarding (greatest in epigastric, LUQ, RUQ), soft, nondistended, +BS  Rectal:  Deferred Ext: no edema  GI:  Lab Results:  Recent Labs  05/31/13 1128  WBC 8.7  HGB 8.4*  HCT 27.1*  PLT 292   BMET  Recent Labs  05/31/13 1128  NA 137  K 3.8  CL 98  CO2 24  GLUCOSE 91  BUN 20  CREATININE 0.92  CALCIUM 9.6   LFT  Recent Labs  05/31/13 1128  PROT 7.3  ALBUMIN 3.5  AST 208*  ALT 205*  ALKPHOS 103  BILITOT 0.3   PT/INR No results found for this basename: LABPROT, INR,  in the last 72 hours   Studies/Results: US Abdomen Complete  05/31/2013   CLINICAL DATA:  Right upper quadrant pain  EXAM: ULTRASOUND ABDOMEN COMPLETE  COMPARISON:  02/25/2012  FINDINGS: Gallbladder:  Cholelithiasis. The gallbladder wall is at the upper limits of normal. A negative sonographic Eulah Pont sign is noted.  Common bile duct:  Diameter: 4 mm  Liver:  No focal lesion identified. Within normal limits in parenchymal echogenicity.  IVC:  No abnormality visualized.  Pancreas:  Visualized portion unremarkable.  Spleen:  Size and appearance within normal limits.  Right Kidney:  Length: 10 cm. Echogenicity within normal limits. No mass or hydronephrosis visualized.  Left Kidney:  Length: 10 cm. Echogenicity within normal limits. No mass or hydronephrosis visualized.  Abdominal aorta:  Atherosclerotic changes are noted without aneurysmal dilatation.  Other findings:  None.  IMPRESSION: Cholelithiasis   Electronically Signed   By: Alcide Clever M.D.   On: 05/31/2013 14:06    Impression/Plan: 80 yo with gallstone pancreatitis and she has likely passed a stone. She could have a retained a small nonobstructing stone in her CBD but will await repeat LFTs in AM. I do  NOT think she has cholangitis. If LFTs are rising then may need to consider an ERCP vs MRCP as the next step but at this time would recommend conservative care with IVFs, following labs. Hold Plavix. Keep NPO except ice chips and sips of water ok. Will follow.    LOS: 0 days   Jake Goodson C.  05/31/2013, 5:10 PM

## 2013-06-01 DIAGNOSIS — D649 Anemia, unspecified: Secondary | ICD-10-CM | POA: Diagnosis not present

## 2013-06-01 DIAGNOSIS — K8020 Calculus of gallbladder without cholecystitis without obstruction: Secondary | ICD-10-CM | POA: Diagnosis not present

## 2013-06-01 DIAGNOSIS — K859 Acute pancreatitis without necrosis or infection, unspecified: Secondary | ICD-10-CM | POA: Diagnosis not present

## 2013-06-01 DIAGNOSIS — E119 Type 2 diabetes mellitus without complications: Secondary | ICD-10-CM | POA: Diagnosis not present

## 2013-06-01 DIAGNOSIS — K8050 Calculus of bile duct without cholangitis or cholecystitis without obstruction: Secondary | ICD-10-CM | POA: Diagnosis not present

## 2013-06-01 LAB — GLUCOSE, CAPILLARY
GLUCOSE-CAPILLARY: 129 mg/dL — AB (ref 70–99)
GLUCOSE-CAPILLARY: 243 mg/dL — AB (ref 70–99)
GLUCOSE-CAPILLARY: 89 mg/dL (ref 70–99)
GLUCOSE-CAPILLARY: 90 mg/dL (ref 70–99)
Glucose-Capillary: 77 mg/dL (ref 70–99)
Glucose-Capillary: 125 mg/dL — ABNORMAL HIGH (ref 70–99)

## 2013-06-01 LAB — CBC
HCT: 26.5 % — ABNORMAL LOW (ref 36.0–46.0)
Hemoglobin: 8.3 g/dL — ABNORMAL LOW (ref 12.0–15.0)
MCH: 22.3 pg — ABNORMAL LOW (ref 26.0–34.0)
MCHC: 31.3 g/dL (ref 30.0–36.0)
MCV: 71 fL — ABNORMAL LOW (ref 78.0–100.0)
Platelets: 291 10*3/uL (ref 150–400)
RBC: 3.73 MIL/uL — ABNORMAL LOW (ref 3.87–5.11)
RDW: 17.9 % — AB (ref 11.5–15.5)
WBC: 6.5 10*3/uL (ref 4.0–10.5)

## 2013-06-01 LAB — COMPREHENSIVE METABOLIC PANEL
ALK PHOS: 94 U/L (ref 39–117)
ALT: 134 U/L — AB (ref 0–35)
AST: 85 U/L — ABNORMAL HIGH (ref 0–37)
Albumin: 3.2 g/dL — ABNORMAL LOW (ref 3.5–5.2)
BUN: 17 mg/dL (ref 6–23)
CO2: 24 mEq/L (ref 19–32)
Calcium: 9.4 mg/dL (ref 8.4–10.5)
Chloride: 101 mEq/L (ref 96–112)
Creatinine, Ser: 0.77 mg/dL (ref 0.50–1.10)
GFR, EST AFRICAN AMERICAN: 84 mL/min — AB (ref >90)
GFR, EST NON AFRICAN AMERICAN: 73 mL/min — AB (ref >90)
GLUCOSE: 71 mg/dL (ref 70–99)
POTASSIUM: 3.8 meq/L (ref 3.7–5.3)
SODIUM: 141 meq/L (ref 137–147)
Total Bilirubin: 0.3 mg/dL (ref 0.3–1.2)
Total Protein: 6.8 g/dL (ref 6.0–8.3)

## 2013-06-01 LAB — LIPASE, BLOOD: LIPASE: 617 U/L — AB (ref 11–59)

## 2013-06-01 MED ORDER — DEXTROSE-NACL 5-0.9 % IV SOLN
INTRAVENOUS | Status: DC
  Administered 2013-06-01: 06:00:00 via INTRAVENOUS

## 2013-06-01 MED ORDER — SODIUM CHLORIDE 0.9 % IV SOLN
INTRAVENOUS | Status: DC
  Administered 2013-06-01 (×2): via INTRAVENOUS

## 2013-06-01 NOTE — Progress Notes (Signed)
Patient ID: Nicole Villa, female   DOB: 08/09/1922, 80 y.o.   MR[facility]: [id number] [facility]  Nicole Villa 80 y.o. 05/09/1922   Subjective: Wants to go home. Denies abdominal pain. No family at bedside.  Objective: Vital signs in last 24 hours: Filed Vitals:   06/01/13 0943  BP: 143/82  Pulse: 109  Temp: 98.2  Resp: 18    Physical Exam: Gen: alert, no acute distress, demented Abd: epigastric tenderness with guarding, soft, nondistended, +BS  Lab Results:  Recent Labs  05/31/13 1128 06/01/13 0504  NA 137 141  K 3.8 3.8  CL 98 101  CO2 24 24  GLUCOSE 91 71  BUN 20 17  CREATININE 0.92 0.77  CALCIUM 9.6 9.4    Recent Labs  05/31/13 1128 06/01/13 0504  AST 208* 85*  ALT 205* 134*  ALKPHOS 103 94  BILITOT 0.3 0.3  PROT 7.3 6.8  ALBUMIN 3.5 3.2*    Recent Labs  05/31/13 1128 06/01/13 0504  WBC 8.7 6.5  NEUTROABS 6.2  --   HGB 8.4* 8.3*  HCT 27.1* 26.5*  MCV 70.8* 71.0*  PLT 292 291   No results found for this basename: LABPROT, INR,  in the last 72 hours    Assessment/Plan: 80 yo with gallstone pancreatitis with LFTs improving. Likely passed a gallstone. Will start clears. Continue IVFs and supportive care. Hold off on ERCP or MRCP at this time.   Kabao Leite C. 06/01/2013, 9:44 AM

## 2013-06-01 NOTE — Progress Notes (Signed)
[facility] PROGRESS NOTE   Nicole Villa:[id number] DOB: 30-Nov-1922 DOA: 05/31/2013 PCP: [name], MD  HPI/Subjective: Feels much better than yesterday, seen with daughter at bedside, questions answered. Epigastric abdominal pain is improving.  Assessment/Plan: Principal Problem:   Acute biliary pancreatitis Active Problems:   Hypertension   Diabetes mellitus   Anemia, unspecified   TIA (transient ischemic attack)   Presenile dementia, uncomplicated   Cholelithiasis   Acute biliary pancreatitis:  -Presented with lipase >3000, mild transaminitis, gallstones on abdominal ultrasound.  -CBD is not dilated and hence could be secondary to passed stone or biliary sludge.  -Lipase and AST/ALT improved since yesterday, likely patient passed gallstone. -Seen by GI earlier, started on clear liquids.  Uncontrolled hypertension:  -Secondary to not taking her medications today. Resume amlodipine and ARB. Hold HCTZ secondary to pancreatitis.   Microcytic anemia:  -Hemoglobin at baseline is in the 10- 11 g per DL range. No overt bleeding. Follow CBC in Villa.m. and transfuse if hemoglobin less than 7 g per DL.   Type II DM:  -Hold oral hypoglycemics and place on sensitive sliding scale insulin.   History of TIA:  -Hold Plavix   History of hyperlipidemia:  -Hold medications secondary to pancreatitis.   History of dementia:  -Mental status at baseline.   Code Status: Full code Family Communication: Plan discussed with the patient. Disposition Plan: Remains inpatient   Consultants:  [facility]  Procedures:  None  Antibiotics:  None   Objective: Filed Vitals:   06/01/13 0943  BP: 143/82  Pulse: 109  Temp:   Resp:     Intake/Output Summary (Last 24 hours) at 06/01/13 1130 Last data filed at 06/01/13 1040  Gross per 24 hour  Intake 249.17 ml  Output      0 ml  Net 249.17 ml   Filed Weights   05/31/13 1040 05/31/13 1535  Weight: 58.06 kg  (128 lb) 54.522 kg (120 lb 3.2 oz)    Exam: General: Alert and awake, oriented x3, not in any acute distress. HEENT: anicteric sclera, pupils reactive to light and accommodation, EOMI CVS: S1-S2 clear, no murmur rubs or gallops Chest: clear to auscultation bilaterally, no wheezing, rales or rhonchi Abdomen: soft nontender, nondistended, normal bowel sounds, no organomegaly Extremities: no cyanosis, clubbing or edema noted bilaterally Neuro: Cranial nerves II-XII intact, no focal neurological deficits  Data Reviewed: Basic Metabolic Panel:  Recent Labs Lab 05/31/13 1128 06/01/13 0504  NA 137 141  K 3.8 3.8  CL 98 101  CO2 24 24  GLUCOSE 91 71  BUN 20 17  CREATININE 0.92 0.77  CALCIUM 9.6 9.4   Liver Function Tests:  Recent Labs Lab 05/31/13 1128 06/01/13 0504  AST 208* 85*  ALT 205* 134*  ALKPHOS 103 94  BILITOT 0.3 0.3  PROT 7.3 6.8  ALBUMIN 3.5 3.2*    Recent Labs Lab 05/31/13 1128 06/01/13 0504  LIPASE >3000* 617*   No results found for this basename: AMMONIA,  in the last 168 hours CBC:  Recent Labs Lab 05/31/13 1128 06/01/13 0504  WBC 8.7 6.5  NEUTROABS 6.2  --   HGB 8.4* 8.3*  HCT 27.1* 26.5*  MCV 70.8* 71.0*  PLT 292 291   Cardiac Enzymes: No results found for this basename: CKTOTAL, CKMB, CKMBINDEX, TROPONINI,  in the last 168 hours BNP (last 3 results) No results found for this basename: PROBNP,  in the last 8760 hours CBG:  Recent Labs Lab 05/31/13 1754 05/31/13 2132 06/01/13 0032  06/01/13 0505 06/01/13 0749  GLUCAP 99 89 89 77 90    Micro No results found for this or any previous visit (from the past 240 hour(s)).   Studies: [place] Abdomen Complete  05/31/2013   CLINICAL DATA:  Right upper quadrant pain  EXAM: ULTRASOUND ABDOMEN COMPLETE  COMPARISON:  02/25/2012  FINDINGS: Gallbladder:  Cholelithiasis. The gallbladder wall is at the upper limits of normal. Villa negative sonographic Eulah PontMurphy sign is noted.  Common bile duct:  Diameter:  4 mm  Liver:  No focal lesion identified. Within normal limits in parenchymal echogenicity.  IVC:  No abnormality visualized.  Pancreas:  Visualized portion unremarkable.  Spleen:  Size and appearance within normal limits.  Right Kidney:  Length: 10 cm. Echogenicity within normal limits. No mass or hydronephrosis visualized.  Left Kidney:  Length: 10 cm. Echogenicity within normal limits. No mass or hydronephrosis visualized.  Abdominal aorta:  Atherosclerotic changes are noted without aneurysmal dilatation.  Other findings:  None.  IMPRESSION: Cholelithiasis   Electronically Signed   By: Alcide CleverMark  Lukens M.D.   On: 05/31/2013 14:06    Scheduled Meds: . amLODipine  10 mg Oral Daily  . antiseptic oral rinse  15 mL Mouth Rinse q12n4p  . chlorhexidine  15 mL Mouth Rinse BID  . enoxaparin (LOVENOX) injection  40 mg Subcutaneous Q24H  . insulin aspart  0-9 Units Subcutaneous 6 times per day  . irbesartan  300 mg Oral Daily  . pantoprazole  40 mg Oral Daily   Continuous Infusions: . sodium chloride 100 mL/hr at 06/01/13 1040  . dextrose 5 % and 0.9% NaCl Stopped (06/01/13 1040)       Time spent: 35 minutes    Ms [facility] Of Cho[facility] HospitalELMAHI,Nicole Villa  [facility] Pager [phone] If 7PM-7AM, please contact night-coverage at [email or web address], password [email or web address] 06/01/2013, 11:30 AM  LOS: 1 day

## 2013-06-01 NOTE — Progress Notes (Signed)
Utilization review completed.  

## 2013-06-02 ENCOUNTER — Encounter (HOSPITAL_COMMUNITY): Payer: Self-pay | Admitting: Student

## 2013-06-02 DIAGNOSIS — E119 Type 2 diabetes mellitus without complications: Secondary | ICD-10-CM

## 2013-06-02 DIAGNOSIS — R109 Unspecified abdominal pain: Secondary | ICD-10-CM | POA: Diagnosis not present

## 2013-06-02 DIAGNOSIS — D649 Anemia, unspecified: Secondary | ICD-10-CM

## 2013-06-02 DIAGNOSIS — K8020 Calculus of gallbladder without cholecystitis without obstruction: Secondary | ICD-10-CM | POA: Diagnosis not present

## 2013-06-02 DIAGNOSIS — K859 Acute pancreatitis without necrosis or infection, unspecified: Secondary | ICD-10-CM

## 2013-06-02 LAB — COMPREHENSIVE METABOLIC PANEL
ALBUMIN: 3 g/dL — AB (ref 3.5–5.2)
ALT: 83 U/L — ABNORMAL HIGH (ref 0–35)
AST: 37 U/L (ref 0–37)
Alkaline Phosphatase: 75 U/L (ref 39–117)
BILIRUBIN TOTAL: 0.2 mg/dL — AB (ref 0.3–1.2)
BUN: 11 mg/dL (ref 6–23)
CALCIUM: 8.2 mg/dL — AB (ref 8.4–10.5)
CHLORIDE: 104 meq/L (ref 96–112)
CO2: 22 mEq/L (ref 19–32)
Creatinine, Ser: 0.66 mg/dL (ref 0.50–1.10)
GFR calc Af Amer: 89 mL/min — ABNORMAL LOW (ref >90)
GFR calc non Af Amer: 77 mL/min — ABNORMAL LOW (ref >90)
Glucose, Bld: 80 mg/dL (ref 70–99)
Potassium: 3.5 mEq/L — ABNORMAL LOW (ref 3.7–5.3)
Sodium: 140 mEq/L (ref 137–147)
Total Protein: 6.4 g/dL (ref 6.0–8.3)

## 2013-06-02 LAB — GLUCOSE, CAPILLARY
GLUCOSE-CAPILLARY: 238 mg/dL — AB (ref 70–99)
GLUCOSE-CAPILLARY: 84 mg/dL (ref 70–99)
Glucose-Capillary: 90 mg/dL (ref 70–99)
Glucose-Capillary: 86 mg/dL (ref 70–99)
Glucose-Capillary: 117 mg/dL — ABNORMAL HIGH (ref 70–99)
Glucose-Capillary: 216 mg/dL — ABNORMAL HIGH (ref 70–99)

## 2013-06-02 MED ORDER — INSULIN ASPART 100 UNIT/ML ~~LOC~~ SOLN: 0.0000 [IU] | Freq: Three times a day (TID) | SUBCUTANEOUS | Status: DC

## 2013-06-02 MED ORDER — INSULIN ASPART 100 UNIT/ML ~~LOC~~ SOLN
0.0000 [IU] | Freq: Three times a day (TID) | SUBCUTANEOUS | Status: DC
  Administered 2013-06-02: 3 [IU] via SUBCUTANEOUS

## 2013-06-02 NOTE — Progress Notes (Signed)
[facility] PROGRESS NOTE   FARRIE SANN ZOX:[id number] DOB: 1923/02/01 DOA: 05/31/2013 PCP: [name], MD  HPI/Subjective: Seen with his daughter at bedside, no nausea or vomiting. Tolerated clear liquids very well. Abdominal pain resolved, LFTs looks much better.  Assessment/Plan: Principal Problem:   Acute biliary pancreatitis Active Problems:   Hypertension   Diabetes mellitus   Microcytic anemia   TIA (transient ischemic attack)   Presenile dementia, uncomplicated   Cholelithiasis   Acute biliary pancreatitis:  -Presented with lipase >3000, mild transaminitis, gallstones on abdominal ultrasound.  -CBD is not dilated and hence could be secondary to passed stone or biliary sludge.  -Lipase and AST/ALT improved since yesterday, likely patient passed gallstone. -Clear liquids advance to full liquids, per GI recommendation advance to low-fat diet as tolerated  Uncontrolled hypertension:  -Secondary to not taking her medications today. Resume amlodipine and ARB. Hold HCTZ secondary to pancreatitis.   Microcytic anemia:  -Hemoglobin at baseline is in the 10- 11 g per DL range. No overt bleeding. Follow CBC in a.m. and transfuse if hemoglobin less than 7 g per DL.   Type II DM:  -Hold oral hypoglycemics and place on sensitive sliding scale insulin.   History of TIA:  -Hold Plavix   History of hyperlipidemia:  -Hold medications secondary to pancreatitis.   History of dementia:  -Mental status at baseline.   Code Status: Full code Family Communication: Plan discussed with the patient. Disposition Plan: Remains inpatient   Consultants:  [facility]  Procedures:  None  Antibiotics:  None   Objective: Filed Vitals:   06/02/13 1408  BP: 129/64  Pulse: 101  Temp: 98.9 F (37.2 C)  Resp: 20    Intake/Output Summary (Last 24 hours) at 06/02/13 1629 Last data filed at 06/01/13 1900  Gross per 24 hour  Intake 660.42 ml  Output      0 ml    Net 660.42 ml   Filed Weights   05/31/13 1040 05/31/13 1535  Weight: 58.06 kg (128 lb) 54.522 kg (120 lb 3.2 oz)    Exam: General: Alert and awake, oriented x3, not in any acute distress. HEENT: anicteric sclera, pupils reactive to light and accommodation, EOMI CVS: S1-S2 clear, no murmur rubs or gallops Chest: clear to auscultation bilaterally, no wheezing, rales or rhonchi Abdomen: soft nontender, nondistended, normal bowel sounds, no organomegaly Extremities: no cyanosis, clubbing or edema noted bilaterally Neuro: Cranial nerves II-XII intact, no focal neurological deficits  Data Reviewed: Basic Metabolic Panel:  Recent Labs Lab 05/31/13 1128 06/01/13 0504 06/02/13 0635  NA 137 141 140  K 3.8 3.8 3.5*  CL 98 101 104  CO2 24 24 22   GLUCOSE 91 71 80  BUN 20 17 11   CREATININE 0.92 0.77 0.66  CALCIUM 9.6 9.4 8.2*   Liver Function Tests:  Recent Labs Lab 05/31/13 1128 06/01/13 0504 06/02/13 0635  AST 208* 85* 37  ALT 205* 134* 83*  ALKPHOS 103 94 75  BILITOT 0.3 0.3 0.2*  PROT 7.3 6.8 6.4  ALBUMIN 3.5 3.2* 3.0*    Recent Labs Lab 05/31/13 1128 06/01/13 0504  LIPASE >3000* 617*   No results found for this basename: AMMONIA,  in the last 168 hours CBC:  Recent Labs Lab 05/31/13 1128 06/01/13 0504  WBC 8.7 6.5  NEUTROABS 6.2  --   HGB 8.4* 8.3*  HCT 27.1* 26.5*  MCV 70.8* 71.0*  PLT 292 291   Cardiac Enzymes: No results found for this basename: CKTOTAL, CKMB, CKMBINDEX, TROPONINI,  in the last 168 hours BNP (last 3 results) No results found for this basename: PROBNP,  in the last 8760 hours CBG:  Recent Labs Lab 06/02/13 0005 06/02/13 0430 06/02/13 0757 06/02/13 1209 06/02/13 1619  GLUCAP 84 90 86 117* 238*    Micro No results found for this or any previous visit (from the past 240 hour(s)).   Studies: No results found.  Scheduled Meds: . amLODipine  10 mg Oral Daily  . enoxaparin (LOVENOX) injection  40 mg Subcutaneous Q24H  .  insulin aspart  0-9 Units Subcutaneous 6 times per day  . irbesartan  300 mg Oral Daily  . pantoprazole  40 mg Oral Daily   Continuous Infusions: . sodium chloride 75 mL/hr at 06/01/13 2151       Time spent: 35 minutes    [facility] HospitalELMAHI,Rajinder Mesick A  [facility] Pager [phone] If 7PM-7AM, please contact night-coverage at [email or web address], password [email or web address] 06/02/2013, 4:29 PM  LOS: 2 days

## 2013-06-02 NOTE — Progress Notes (Signed)
Subjective: No abdominal pain. Tolerating clear liquids.  Objective: Vital signs in last 24 hours: Temp:  [97.4 F (36.3 C)-98 F (36.7 C)] 97.4 F (36.3 C) (04/06 0433) Pulse Rate:  [95-107] 95 (04/06 0433) Resp:  [18] 18 (04/06 0433) BP: (131-148)/(66-73) 134/71 mmHg (04/06 0433) SpO2:  [94 %-98 %] 98 % (04/06 0433) Weight change:  Last BM Date: 06/01/13  PE: GEN:  NAD ABD:  Soft, non-tender, hypoactive but present bowel sounds  Lab Results: CBC    Component Value Date/Time   WBC 6.5 06/01/2013 0504   RBC 3.73* 06/01/2013 0504   RBC 3.83* 05/08/2012 0513   HGB 8.3* 06/01/2013 0504   HCT 26.5* 06/01/2013 0504   PLT 291 06/01/2013 0504   MCV 71.0* 06/01/2013 0504   MCH 22.3* 06/01/2013 0504   MCHC 31.3 06/01/2013 0504   RDW 17.9* 06/01/2013 0504   LYMPHSABS 1.4 05/31/2013 1128   MONOABS 0.9 05/31/2013 1128   EOSABS 0.1 05/31/2013 1128   BASOSABS 0.0 05/31/2013 1128   CMP     Component Value Date/Time   NA 140 06/02/2013 0635   K 3.5* 06/02/2013 0635   CL 104 06/02/2013 0635   CO2 22 06/02/2013 0635   GLUCOSE 80 06/02/2013 0635   BUN 11 06/02/2013 0635   CREATININE 0.66 06/02/2013 0635   CALCIUM 8.2* 06/02/2013 0635   PROT 6.4 06/02/2013 0635   ALBUMIN 3.0* 06/02/2013 0635   AST 37 06/02/2013 0635   ALT 83* 06/02/2013 0635   ALKPHOS 75 06/02/2013 0635   BILITOT 0.2* 06/02/2013 0635   GFRNONAA 77* 06/02/2013 0635   GFRAA 89* 06/02/2013 0635   Lipase     Component Value Date/Time   LIPASE 617* 06/01/2013 0504   Assessment:  1.  Gallstone pancreatitis.  Improving. 2.  Multiple chronic medical problems.  Plan:  1.  Saline lock IV fluids. 2.  Full liquid diet, advance to low fat diet as tolerated. 3.  Ambulate in room, and in halls as tolerated. 4.  If does well with measures # 1-3 above, hopefully can be discharged home tomorrow from GI perspective. 5.  Patient and daughter opt against surgery at this time, which is not unreasonable.  However, patient has had two bouts of pancreatitis over the past couple  years.  When/if she has another attack, I told patient/daughter that we should at least get a surgical consultation for consideration of cholecystectomy.   Freddy JakschUTLAW,Karinne Schmader M 06/02/2013, 10:57 AM

## 2013-06-03 DIAGNOSIS — D649 Anemia, unspecified: Secondary | ICD-10-CM | POA: Diagnosis not present

## 2013-06-03 DIAGNOSIS — I10 Essential (primary) hypertension: Secondary | ICD-10-CM | POA: Diagnosis not present

## 2013-06-03 DIAGNOSIS — K8020 Calculus of gallbladder without cholecystitis without obstruction: Secondary | ICD-10-CM | POA: Diagnosis not present

## 2013-06-03 DIAGNOSIS — D509 Iron deficiency anemia, unspecified: Secondary | ICD-10-CM

## 2013-06-03 DIAGNOSIS — K859 Acute pancreatitis without necrosis or infection, unspecified: Secondary | ICD-10-CM | POA: Diagnosis not present

## 2013-06-03 LAB — COMPREHENSIVE METABOLIC PANEL
ALT: 69 U/L — AB (ref 0–35)
AST: 31 U/L (ref 0–37)
Albumin: 2.9 g/dL — ABNORMAL LOW (ref 3.5–5.2)
Alkaline Phosphatase: 87 U/L (ref 39–117)
BUN: 13 mg/dL (ref 6–23)
CHLORIDE: 106 meq/L (ref 96–112)
CO2: 22 meq/L (ref 19–32)
Calcium: 8.5 mg/dL (ref 8.4–10.5)
Creatinine, Ser: 0.76 mg/dL (ref 0.50–1.10)
GFR, EST AFRICAN AMERICAN: 85 mL/min — AB (ref >90)
GFR, EST NON AFRICAN AMERICAN: 73 mL/min — AB (ref >90)
GLUCOSE: 94 mg/dL (ref 70–99)
Potassium: 4.1 mEq/L (ref 3.7–5.3)
Sodium: 143 mEq/L (ref 137–147)
Total Protein: 6.3 g/dL (ref 6.0–8.3)

## 2013-06-03 LAB — GLUCOSE, CAPILLARY: Glucose-Capillary: 109 mg/dL — ABNORMAL HIGH (ref 70–99)

## 2013-06-03 MED ORDER — POLYSACCHARIDE IRON COMPLEX 150 MG PO CAPS: 150.0000 mg | ORAL_CAPSULE | Freq: Every day | ORAL | Status: DC

## 2013-06-03 MED ORDER — POLYSACCHARIDE IRON COMPLEX 150 MG PO CAPS
150.0000 mg | ORAL_CAPSULE | Freq: Every day | ORAL | Status: DC
  Administered 2013-06-03: 150 mg via ORAL
  Filled 2013-06-03: qty 1

## 2013-06-03 MED ORDER — IRBESARTAN 300 MG PO TABS: 300.0000 mg | ORAL_TABLET | Freq: Every day | ORAL | Status: DC

## 2013-06-03 NOTE — Discharge Summary (Signed)
Physician Discharge Summary  TRAVIS PURK WUJ:[id number] DOB: May 30, 1922 DOA: 05/31/2013  PCP: [name], MD  Admit date: 05/31/2013 Discharge date: 06/03/2013  Time spent: 50 minutes  Recommendations for Outpatient Follow-up:  1. Recurrent pancreatitis.  Felt to be gall stone induced. 2. HCTZ discontinued.  Please monitor BP. 3. Microcytic anemia.  Stable for outpatient work up.  Discharge Diagnoses:  Principal Problem:   Acute biliary pancreatitis Active Problems:   Hypertension   Diabetes mellitus   Microcytic anemia   TIA (transient ischemic attack)   Presenile dementia, uncomplicated   Cholelithiasis   Discharge Condition: stable.  Tolerating small amounts of solid food  Diet recommendation: low fat, small amounts  Filed Weights   05/31/13 1040 05/31/13 1535  Weight: 58.06 kg (128 lb) 54.522 kg (120 lb 3.2 oz)    History of present illness:  Nicole Villa is a 80 y.o. female with history of gallstones, type II DM, HTN, GERD, HLD, anemia, dementia, presented to the ED with complaints of abdominal pain. Patient unable to provide significant history secondary to dementia. History obtained from patient's 2 daughters are at bedside. She was in her usual state of health until 7 PM on 05/30/13 when she first complained of pain across upper abdomen with no associated nausea, vomiting, fever, chills, diarrhea or constipation. They thought that it was indigestion and gave her 2 tablets of TUMS and patient went to bed. At 9 PM, patient again complained of significant abdominal pain. EMS was called who came and evaluated patient. EKG was said to be normal. Systolic blood pressure was in the 200 mmHg range. They indicated that this was most likely GI in origin. Patient received a pain pill that she had taken during prior such episode a year and half ago. This helped her pain and she went to sleep. Symptoms recurred at 5 AM today. PCP was contacted and patient was advised to come  to the ED. In the ED, lipase >3000, AST 208, ALT 205, hemoglobin 8.4 and abdominal ultrasound shows cholelithiasis but nondilated CBD.   Hospital Course:  Acute biliary pancreatitis:  -Presented with lipase >3000, mild transaminitis, gallstones on abdominal ultrasound.  -CBD is not dilated and hence symptoms could be secondary to passed stone or biliary sludge.  -Lipase and AST/ALT improved since yesterday, likely patient passed gallstone.  -small amounts low-fat diet tolerated prior to d/c [facility] consulted.  No ERCP or MRCP needed at this time.  Uncontrolled hypertension:  -Secondary to not taking her medications on the day of admission.  -Resumed amlodipine and ARB inpatient.  -Hold HCTZ secondary to pancreatitis.   Microcytic anemia:  -Hemoglobin at baseline is in the 10- 11 g per DL range. No overt bleeding.  -Stable for outpatient work up.  Started patient on iron supplement.   Type II DM:  -continue outpatient regimen  History of TIA:  -continue Plavix   History of hyperlipidemia:  -Hold medications inpatient secondary to pancreatitis.  Restarted on d/c  History of dementia:  -Mental status at baseline.   Consultations:  [facility]  Discharge Exam: Filed Vitals:   06/03/13 0437  BP: 135/70  Pulse: 96  Temp: 98.4 F (36.9 C)  Resp: 18   General: Elderly female, Alert and awake with clear speech, oriented x3, not in any acute distress.  HEENT: anicteric sclera, pupils reactive to light and accommodation, EOMI  CVS: S1-S2 clear, no murmur rubs or gallops  Chest: clear to auscultation bilaterally, no wheezing, rales or rhonchi  Abdomen:  soft nontender, nondistended, normal bowel sounds, no organomegaly  Extremities: no cyanosis, clubbing or edema noted bilaterally  Neuro: Cranial nerves II-XII intact, no focal neurological deficits    Discharge Instructions       Discharge Orders   Future Orders Complete By Expires   Diet - low sodium  heart healthy  As directed    Increase activity slowly  As directed        Medication List    STOP taking these medications       valsartan-hydrochlorothiazide 320-12.5 MG per tablet  Commonly known as:  DIOVAN-HCT      TAKE these medications       acetaminophen 325 MG tablet  Commonly known as:  TYLENOL  Take 650 mg by mouth at bedtime.     amLODipine 10 MG tablet  Commonly known as:  NORVASC  Take 10 mg by mouth daily.     beta carotene w/minerals tablet  Take 1 tablet by mouth daily.     cholecalciferol 1000 UNITS tablet  Commonly known as:  VITAMIN D  Take 1,000 Units by mouth daily.     clopidogrel 75 MG tablet  Commonly known as:  PLAVIX  Take 1 tablet (75 mg total) by mouth daily with breakfast.     fish oil-omega-3 fatty acids 1000 MG capsule  Take 1 g by mouth daily.     glimepiride 1 MG tablet  Commonly known as:  AMARYL  Take 0.5 mg by mouth See admin instructions. Take a 1/2 tablet with the first meal of the day orally twice daily     irbesartan 300 MG tablet  Commonly known as:  AVAPRO  Take 1 tablet (300 mg total) by mouth daily.     irbesartan 300 MG tablet  Commonly known as:  AVAPRO  Take 1 tablet (300 mg total) by mouth daily.     iron polysaccharides 150 MG capsule  Commonly known as:  NIFEREX  Take 1 capsule (150 mg total) by mouth daily.     nabumetone 500 MG tablet  Commonly known as:  RELAFEN  Take 500 mg by mouth daily.     pantoprazole 40 MG tablet  Commonly known as:  PROTONIX  Take 40 mg by mouth daily.     pravastatin 20 MG tablet  Commonly known as:  PRAVACHOL  Take 20 mg by mouth daily.     WELCHOL 3.75 G Pack  Generic drug:  Colesevelam HCl  Take 1 packet by mouth daily with breakfast.       Allergies  Allergen Reactions  . Lisinopril Cough   Follow-up Information   Follow up with [name], MD On 06/12/2013. (APPOINTMENT WITH DR. SHAW WILL BE ON 06/12/13 AT 11:45 PLEASE COME IN AT 11:30 FOR CHECK IN)     Specialty:  Family Medicine   Contact information:   [address] [place] [address] [phone]       Follow up On 06/03/2013.       The results of significant diagnostics from this hospitalization (including imaging, microbiology, ancillary and laboratory) are listed below for reference.    Significant Diagnostic Studies: US Abdomen Complete  05/31/2013   CLINICAL DATA:  Right upper quadrant pain  EXAM: ULTRASOUND ABDOMEN COMPLETE  COMPARISON:  02/25/2012  FINDINGS: Gallbladder:  Cholelithiasis. The gallbladder wall is at the upper limits of normal. A negative sonographic Eulah Pont sign is noted.  Common bile duct:  Diameter: 4 mm  Liver:  No focal lesion  identified. Within normal limits in parenchymal echogenicity.  IVC:  No abnormality visualized.  Pancreas:  Visualized portion unremarkable.  Spleen:  Size and appearance within normal limits.  Right Kidney:  Length: 10 cm. Echogenicity within normal limits. No mass or hydronephrosis visualized.  Left Kidney:  Length: 10 cm. Echogenicity within normal limits. No mass or hydronephrosis visualized.  Abdominal aorta:  Atherosclerotic changes are noted without aneurysmal dilatation.  Other findings:  None.  IMPRESSION: Cholelithiasis   Electronically Signed   By: Alcide CleverMark  Lukens M.D.   On: 05/31/2013 14:06     Labs: Basic Metabolic Panel:  Recent Labs Lab 05/31/13 1128 06/01/13 0504 06/02/13 0635 06/03/13 0700  NA 137 141 140 143  K 3.8 3.8 3.5* 4.1  CL 98 101 104 106  CO2 24 24 22 22   GLUCOSE 91 71 80 94  BUN 20 17 11 13   CREATININE 0.92 0.77 0.66 0.76  CALCIUM 9.6 9.4 8.2* 8.5   Liver Function Tests:  Recent Labs Lab 05/31/13 1128 06/01/13 0504 06/02/13 0635 06/03/13 0700  AST 208* 85* 37 31  ALT 205* 134* 83* 69*  ALKPHOS 103 94 75 87  BILITOT 0.3 0.3 0.2* <0.2*  PROT 7.3 6.8 6.4 6.3  ALBUMIN 3.5 3.2* 3.0* 2.9*    Recent Labs Lab 05/31/13 1128 06/01/13 0504  LIPASE >3000* 617*   CBC:  Recent  Labs Lab 05/31/13 1128 06/01/13 0504  WBC 8.7 6.5  NEUTROABS 6.2  --   HGB 8.4* 8.3*  HCT 27.1* 26.5*  MCV 70.8* 71.0*  PLT 292 291   CBG:  Recent Labs Lab 06/02/13 0757 06/02/13 1209 06/02/13 1619 06/02/13 2119 06/03/13 0749  GLUCAP 86 117* 238* 216* 109*       Signed:  Conley CanalYork, Fariha Goto L, PA-C  [phone] [facility] 06/03/2013, 2:04 PM

## 2013-06-03 NOTE — Discharge Instructions (Signed)
Your Diovan has been discontinued as HCTZ can induce pancreatitis.  Please continue with Avapro. Advance your diet slowly.  If you have pain - stop eating solid foods and drink clear liquids.  If the pain becomes severe or does not resolve in 24 - 48 hours please see you physician.

## 2013-06-03 NOTE — Progress Notes (Signed)
DC home with daughter, pt and daughter verbally understood DC instructions, no questions asked

## 2013-06-04 NOTE — Discharge Summary (Signed)
Addendum  Patient seen and examined, chart and data base reviewed.  I agree with the above assessment and plan.  For full details please see Mrs. Algis DownsMarianne York PA note.  Acute pancreatitis, likely gallstone pancreatitis.  This is resolved on its own, likely patient passed stone.  Consider cholecystectomy as outpatient, probably needs cardiology evaluation prior to surgery.   Clint LippsMutaz A Mayleigh Tetrault, MD [facility] Pager: [phone] 06/04/2013, 5:37 PM

## 2013-06-12 DIAGNOSIS — K859 Acute pancreatitis without necrosis or infection, unspecified: Secondary | ICD-10-CM | POA: Diagnosis not present

## 2013-06-12 DIAGNOSIS — I129 Hypertensive chronic kidney disease with stage 1 through stage 4 chronic kidney disease, or unspecified chronic kidney disease: Secondary | ICD-10-CM | POA: Diagnosis not present

## 2013-06-12 DIAGNOSIS — E782 Mixed hyperlipidemia: Secondary | ICD-10-CM | POA: Diagnosis not present

## 2013-06-12 DIAGNOSIS — K8020 Calculus of gallbladder without cholecystitis without obstruction: Secondary | ICD-10-CM | POA: Diagnosis not present

## 2013-06-12 DIAGNOSIS — N183 Chronic kidney disease, stage 3 unspecified: Secondary | ICD-10-CM | POA: Diagnosis not present

## 2013-06-12 DIAGNOSIS — R413 Other amnesia: Secondary | ICD-10-CM | POA: Diagnosis not present

## 2013-06-12 DIAGNOSIS — D509 Iron deficiency anemia, unspecified: Secondary | ICD-10-CM | POA: Diagnosis not present

## 2013-06-12 DIAGNOSIS — I700 Atherosclerosis of aorta: Secondary | ICD-10-CM | POA: Diagnosis not present

## 2013-07-14 DIAGNOSIS — D649 Anemia, unspecified: Secondary | ICD-10-CM | POA: Diagnosis not present

## 2013-07-18 IMAGING — CT CT ABD-PELV W/ CM
2 of 5 series · 16 of 46 positions shown, 18 images · IV contrast (APPLIED)
Comparison: Abdominal ultrasound performed 02/20/2012

CLINICAL DATA: Diffuse abdominal pain.  Nausea.

CT ABDOMEN AND PELVIS WITH CONTRAST
TECHNIQUE: Multidetector CT imaging of the abdomen and pelvis was
performed following the standard protocol during bolus
administration of intravenous contrast.
Contrast: 100 mL of Omnipaque 300 IV contrast

[Series 3: abd/pelvis 5.0 b31f · axial · 0.75mm/px · z∈[-278,+58]mm · 13 of 77 slices shown, 15 images]
[im 5/77  soft-tissue]
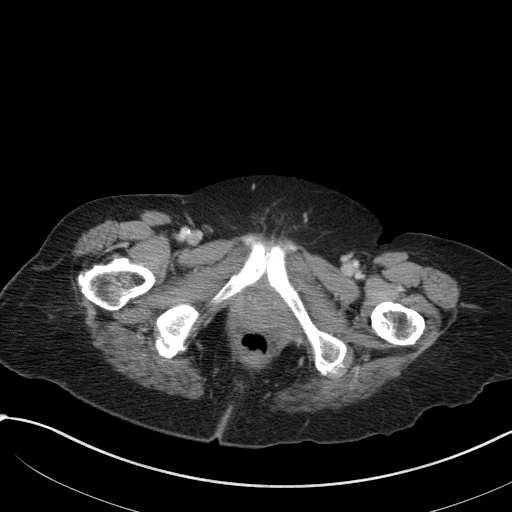
[im 5/77  bone]
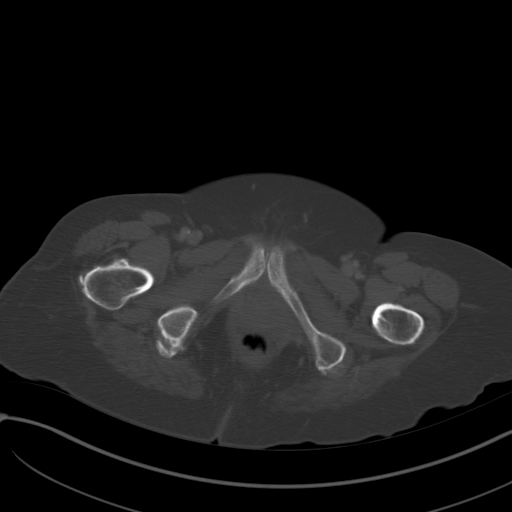
[im 9/77  soft-tissue]
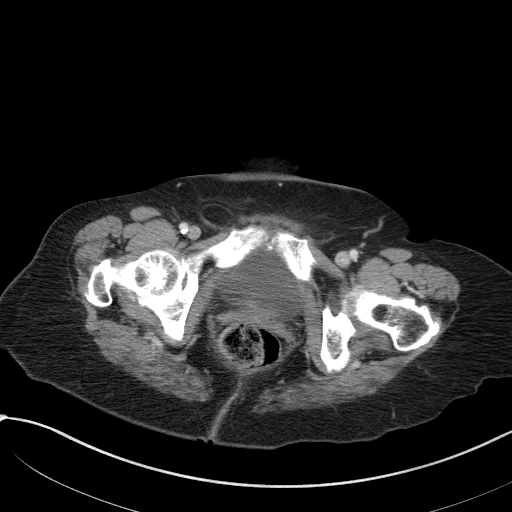
[im 17/77  soft-tissue]
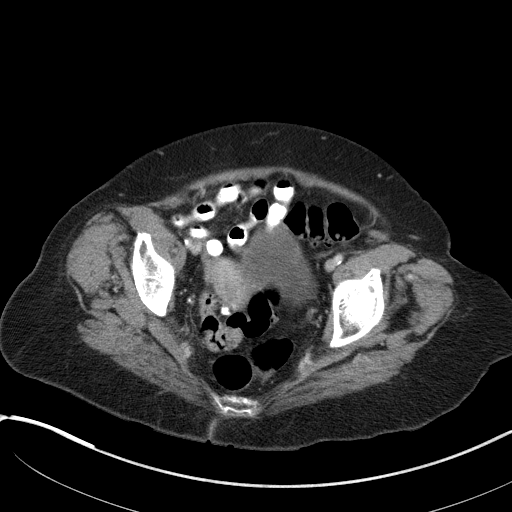
[im 22/77  soft-tissue]
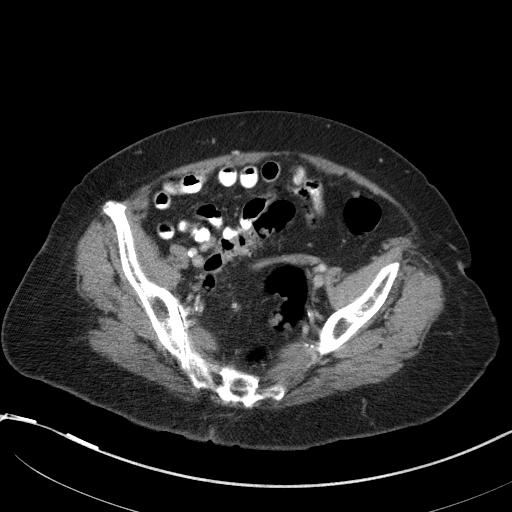
[im 26/77  soft-tissue]
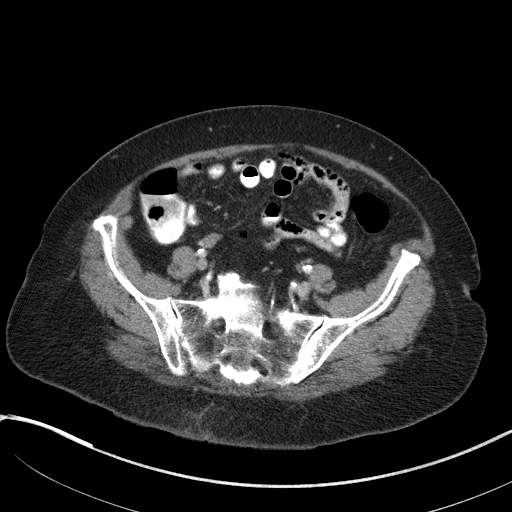
[im 34/77  soft-tissue]
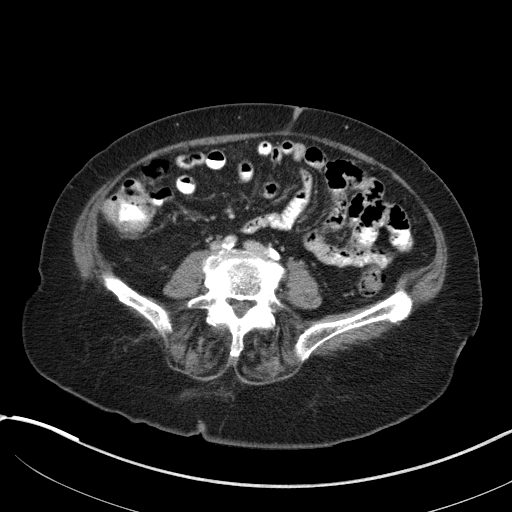
[im 39/77  soft-tissue]
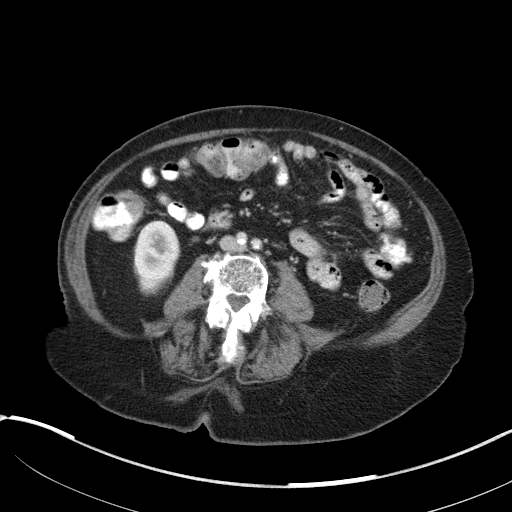
[im 43/77  soft-tissue]
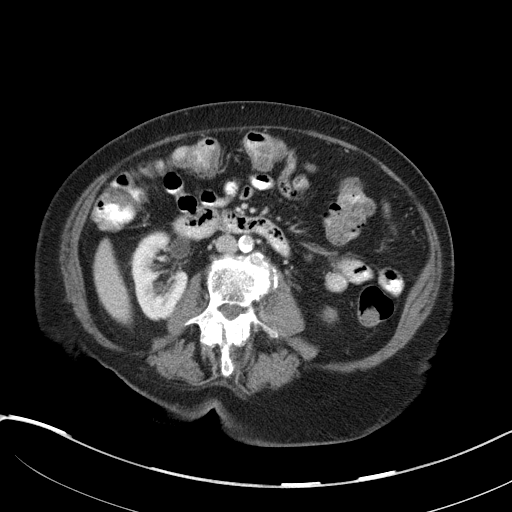
[im 51/77  soft-tissue]
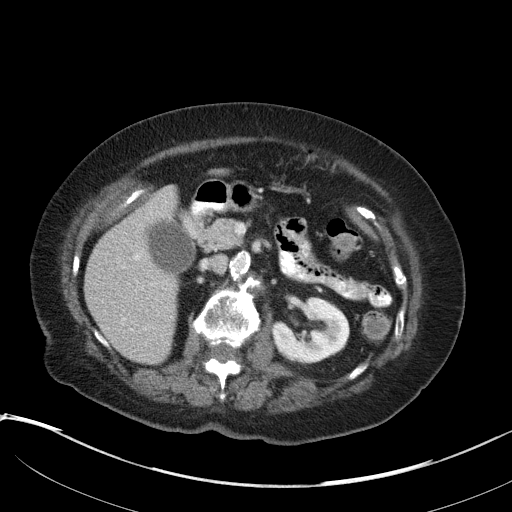
[im 51/77  bone]
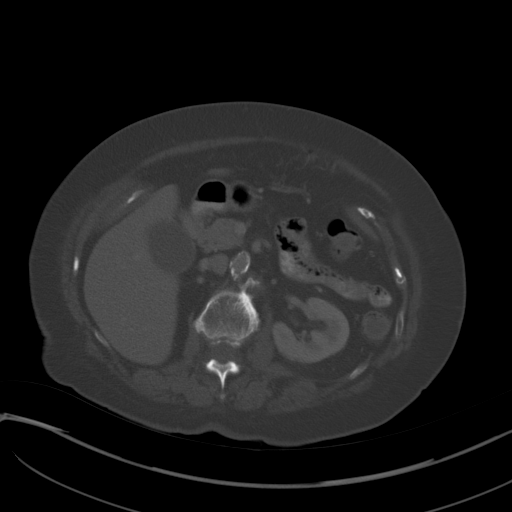
[im 55/77  soft-tissue]
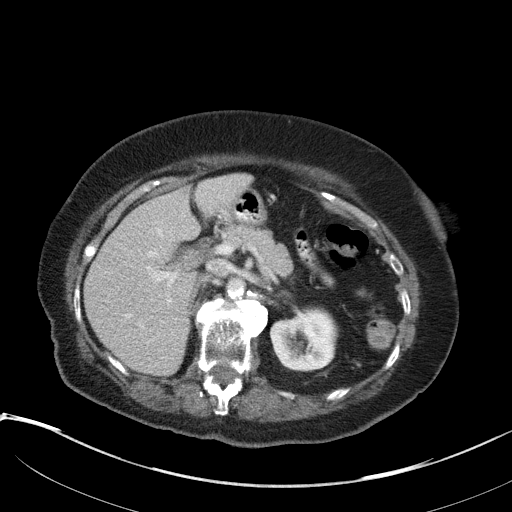
[im 60/77  soft-tissue]
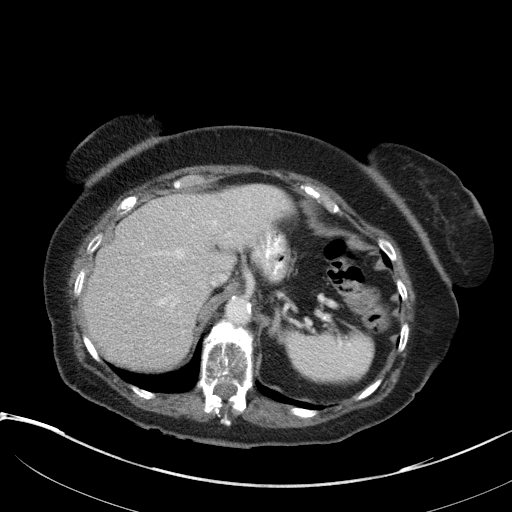
[im 68/77  soft-tissue]
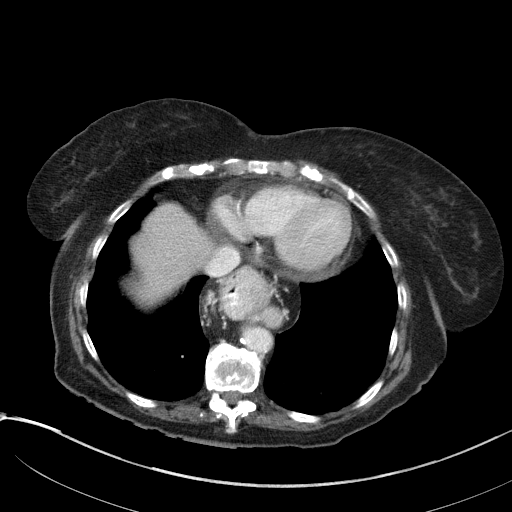
[im 72/77  soft-tissue]
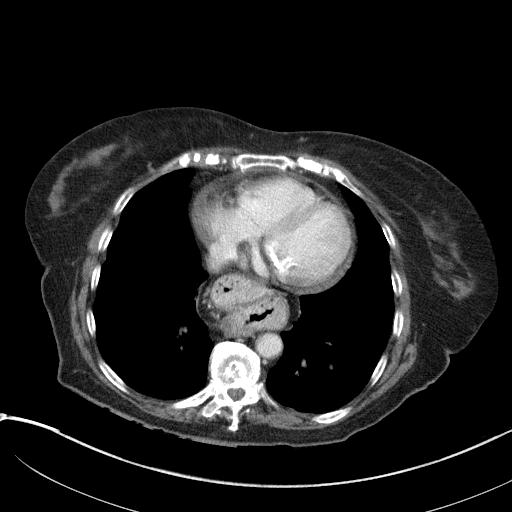

[Series 6: abd/pelvis 3.0 coronal · coronal · 0.76mm/px · 3 of 85 slices shown]
[im 29/85  soft-tissue]
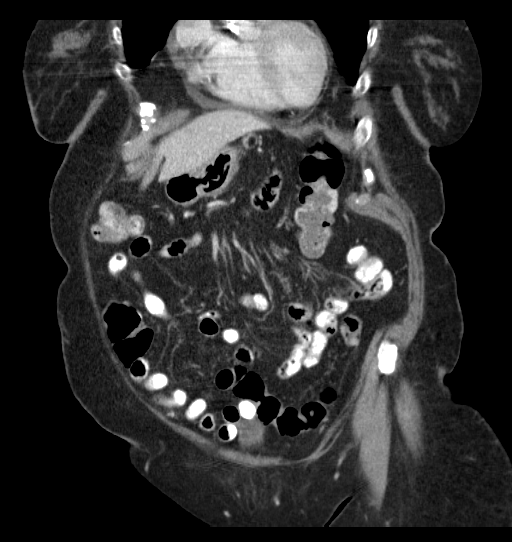
[im 38/85  soft-tissue]
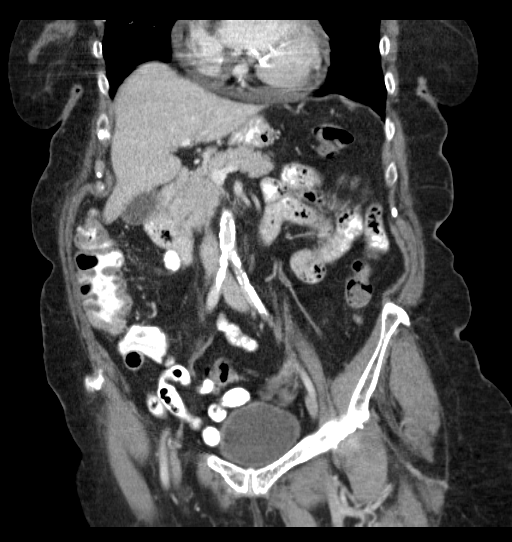
[im 47/85  soft-tissue]
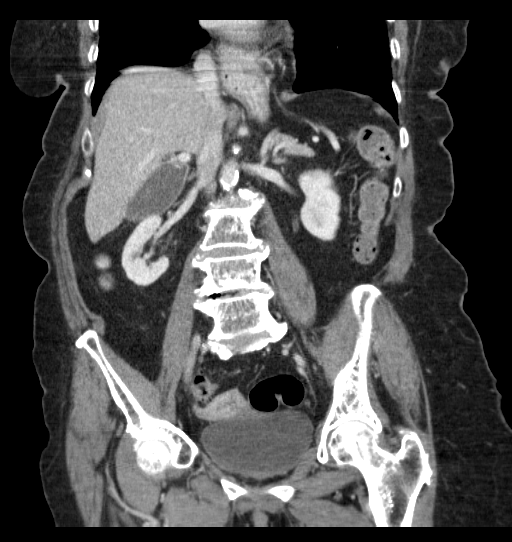

[16 of 46 positions shown; findings below may reference images not displayed]

FINDINGS: The visualized lung bases are clear.  A trace pericardial
effusion is noted.  Calcification is noted at the aortic and mitral
valves.  A moderate hiatal hernia is noted.

The liver and spleen are unremarkable in appearance.  Known
gallstones are not well characterized on CT; the gallbladder is
grossly unremarkable in appearance.  The pancreas and adrenal
glands are unremarkable.

The kidneys are unremarkable in appearance; there is incomplete
superior migration of the right kidney.  There is no evidence of
hydronephrosis.  No renal or ureteral stones are seen.  No
perinephric stranding is appreciated.

No free fluid is identified.  The small bowel is unremarkable in
appearance.  The stomach is within normal limits.  No acute
vascular abnormalities are seen.  Diffuse calcification is noted
along the abdominal aorta and its branches, with likely moderate
stenosis along the distal abdominal aorta.

The appendix is not definitely characterized; there is no evidence
for appendicitis.  Contrast progresses to the level of the distal
transverse colon.  Scattered diverticulosis is noted along the
distal descending and sigmoid colon, without evidence of
diverticulitis.  The colon is otherwise unremarkable in appearance.

The bladder is mildly distended and grossly unremarkable in
appearance.  The uterus is within normal limits.  A prominent
calcification posterior to the uterine fundus may reflect an
exophytic fibroid.  The ovaries are relatively symmetric; no
suspicious adnexal masses are seen.  No inguinal lymphadenopathy is
seen.

A tiny right inguinal hernia is noted, containing only fat.

No acute osseous abnormalities are identified.  Vacuum phenomenon
is noted at L2-L3 and at L4-L5.  Anterior bridging osteophytes are
noted along the lower thoracic spine.
IMPRESSION: 1.  No acute abnormality seen within the abdomen or pelvis.
2.  Diffuse calcification along the abdominal aorta and its
branches, with likely moderate stenosis along the distal abdominal
aorta.
3.  Moderate hiatal hernia noted.
4.  Known gallstones are not well characterized on CT; the
gallbladder is unremarkable in appearance.
5.  Tiny right inguinal hernia, containing only fat.
6.  Likely diffusely calcified exophytic fibroid noted.
7.  Anterior bridging osteophytes along the lower thoracic spine,
compatible with DISH.
8.  Scattered diverticulosis along the distal descending and
sigmoid colon, without evidence of diverticulitis.
9.  Trace pericardial effusion noted.

## 2013-07-24 DIAGNOSIS — Z961 Presence of intraocular lens: Secondary | ICD-10-CM | POA: Diagnosis not present

## 2013-07-24 DIAGNOSIS — H353190 Nonexudative age-related macular degeneration, unspecified eye, stage unspecified: Secondary | ICD-10-CM | POA: Diagnosis not present

## 2013-07-24 DIAGNOSIS — H2510 Age-related nuclear cataract, unspecified eye: Secondary | ICD-10-CM | POA: Diagnosis not present

## 2013-08-11 DIAGNOSIS — D509 Iron deficiency anemia, unspecified: Secondary | ICD-10-CM | POA: Diagnosis not present

## 2013-08-11 DIAGNOSIS — IMO0002 Reserved for concepts with insufficient information to code with codable children: Secondary | ICD-10-CM | POA: Diagnosis not present

## 2013-08-11 DIAGNOSIS — K921 Melena: Secondary | ICD-10-CM | POA: Diagnosis not present

## 2013-08-11 DIAGNOSIS — M1710 Unilateral primary osteoarthritis, unspecified knee: Secondary | ICD-10-CM | POA: Diagnosis not present

## 2013-08-11 DIAGNOSIS — R413 Other amnesia: Secondary | ICD-10-CM | POA: Diagnosis not present

## 2013-08-19 DIAGNOSIS — Z5189 Encounter for other specified aftercare: Secondary | ICD-10-CM | POA: Diagnosis not present

## 2013-08-19 DIAGNOSIS — M1710 Unilateral primary osteoarthritis, unspecified knee: Secondary | ICD-10-CM | POA: Diagnosis not present

## 2013-08-19 DIAGNOSIS — R413 Other amnesia: Secondary | ICD-10-CM | POA: Diagnosis not present

## 2013-08-19 DIAGNOSIS — D509 Iron deficiency anemia, unspecified: Secondary | ICD-10-CM | POA: Diagnosis not present

## 2013-08-19 DIAGNOSIS — Z9181 History of falling: Secondary | ICD-10-CM | POA: Diagnosis not present

## 2013-08-21 DIAGNOSIS — M1710 Unilateral primary osteoarthritis, unspecified knee: Secondary | ICD-10-CM | POA: Diagnosis not present

## 2013-08-21 DIAGNOSIS — Z5189 Encounter for other specified aftercare: Secondary | ICD-10-CM | POA: Diagnosis not present

## 2013-08-21 DIAGNOSIS — R413 Other amnesia: Secondary | ICD-10-CM | POA: Diagnosis not present

## 2013-08-21 DIAGNOSIS — Z9181 History of falling: Secondary | ICD-10-CM | POA: Diagnosis not present

## 2013-08-21 DIAGNOSIS — D509 Iron deficiency anemia, unspecified: Secondary | ICD-10-CM | POA: Diagnosis not present

## 2013-08-22 DIAGNOSIS — Z9181 History of falling: Secondary | ICD-10-CM | POA: Diagnosis not present

## 2013-08-22 DIAGNOSIS — D509 Iron deficiency anemia, unspecified: Secondary | ICD-10-CM | POA: Diagnosis not present

## 2013-08-22 DIAGNOSIS — Z5189 Encounter for other specified aftercare: Secondary | ICD-10-CM | POA: Diagnosis not present

## 2013-08-22 DIAGNOSIS — M1710 Unilateral primary osteoarthritis, unspecified knee: Secondary | ICD-10-CM | POA: Diagnosis not present

## 2013-08-22 DIAGNOSIS — R413 Other amnesia: Secondary | ICD-10-CM | POA: Diagnosis not present

## 2013-08-25 DIAGNOSIS — D509 Iron deficiency anemia, unspecified: Secondary | ICD-10-CM | POA: Diagnosis not present

## 2013-08-25 DIAGNOSIS — Z5189 Encounter for other specified aftercare: Secondary | ICD-10-CM | POA: Diagnosis not present

## 2013-08-25 DIAGNOSIS — R413 Other amnesia: Secondary | ICD-10-CM | POA: Diagnosis not present

## 2013-08-25 DIAGNOSIS — Z9181 History of falling: Secondary | ICD-10-CM | POA: Diagnosis not present

## 2013-08-25 DIAGNOSIS — M1710 Unilateral primary osteoarthritis, unspecified knee: Secondary | ICD-10-CM | POA: Diagnosis not present

## 2013-08-26 DIAGNOSIS — M1710 Unilateral primary osteoarthritis, unspecified knee: Secondary | ICD-10-CM | POA: Diagnosis not present

## 2013-08-28 DIAGNOSIS — M1710 Unilateral primary osteoarthritis, unspecified knee: Secondary | ICD-10-CM | POA: Diagnosis not present

## 2013-08-28 DIAGNOSIS — Z9181 History of falling: Secondary | ICD-10-CM | POA: Diagnosis not present

## 2013-08-28 DIAGNOSIS — Z5189 Encounter for other specified aftercare: Secondary | ICD-10-CM | POA: Diagnosis not present

## 2013-08-28 DIAGNOSIS — R413 Other amnesia: Secondary | ICD-10-CM | POA: Diagnosis not present

## 2013-08-28 DIAGNOSIS — D509 Iron deficiency anemia, unspecified: Secondary | ICD-10-CM | POA: Diagnosis not present

## 2013-09-02 DIAGNOSIS — Z9181 History of falling: Secondary | ICD-10-CM | POA: Diagnosis not present

## 2013-09-02 DIAGNOSIS — R413 Other amnesia: Secondary | ICD-10-CM | POA: Diagnosis not present

## 2013-09-02 DIAGNOSIS — M1710 Unilateral primary osteoarthritis, unspecified knee: Secondary | ICD-10-CM | POA: Diagnosis not present

## 2013-09-02 DIAGNOSIS — Z5189 Encounter for other specified aftercare: Secondary | ICD-10-CM | POA: Diagnosis not present

## 2013-09-02 DIAGNOSIS — D509 Iron deficiency anemia, unspecified: Secondary | ICD-10-CM | POA: Diagnosis not present

## 2013-09-04 DIAGNOSIS — M1710 Unilateral primary osteoarthritis, unspecified knee: Secondary | ICD-10-CM | POA: Diagnosis not present

## 2013-09-04 DIAGNOSIS — D509 Iron deficiency anemia, unspecified: Secondary | ICD-10-CM | POA: Diagnosis not present

## 2013-09-04 DIAGNOSIS — Z5189 Encounter for other specified aftercare: Secondary | ICD-10-CM | POA: Diagnosis not present

## 2013-09-04 DIAGNOSIS — Z9181 History of falling: Secondary | ICD-10-CM | POA: Diagnosis not present

## 2013-09-04 DIAGNOSIS — R413 Other amnesia: Secondary | ICD-10-CM | POA: Diagnosis not present

## 2013-09-10 DIAGNOSIS — M1710 Unilateral primary osteoarthritis, unspecified knee: Secondary | ICD-10-CM | POA: Diagnosis not present

## 2013-09-10 DIAGNOSIS — R413 Other amnesia: Secondary | ICD-10-CM | POA: Diagnosis not present

## 2013-09-10 DIAGNOSIS — Z5189 Encounter for other specified aftercare: Secondary | ICD-10-CM | POA: Diagnosis not present

## 2013-09-10 DIAGNOSIS — Z9181 History of falling: Secondary | ICD-10-CM | POA: Diagnosis not present

## 2013-09-10 DIAGNOSIS — D509 Iron deficiency anemia, unspecified: Secondary | ICD-10-CM | POA: Diagnosis not present

## 2013-11-11 DIAGNOSIS — N183 Chronic kidney disease, stage 3 unspecified: Secondary | ICD-10-CM | POA: Diagnosis not present

## 2013-11-11 DIAGNOSIS — E1129 Type 2 diabetes mellitus with other diabetic kidney complication: Secondary | ICD-10-CM | POA: Diagnosis not present

## 2013-11-11 DIAGNOSIS — D509 Iron deficiency anemia, unspecified: Secondary | ICD-10-CM | POA: Diagnosis not present

## 2013-11-11 DIAGNOSIS — E782 Mixed hyperlipidemia: Secondary | ICD-10-CM | POA: Diagnosis not present

## 2013-11-11 DIAGNOSIS — Z23 Encounter for immunization: Secondary | ICD-10-CM | POA: Diagnosis not present

## 2013-11-11 DIAGNOSIS — R413 Other amnesia: Secondary | ICD-10-CM | POA: Diagnosis not present

## 2013-11-11 DIAGNOSIS — I129 Hypertensive chronic kidney disease with stage 1 through stage 4 chronic kidney disease, or unspecified chronic kidney disease: Secondary | ICD-10-CM | POA: Diagnosis not present

## 2013-11-11 DIAGNOSIS — N058 Unspecified nephritic syndrome with other morphologic changes: Secondary | ICD-10-CM | POA: Diagnosis not present

## 2013-11-11 DIAGNOSIS — I700 Atherosclerosis of aorta: Secondary | ICD-10-CM | POA: Diagnosis not present

## 2013-11-19 ENCOUNTER — Other Ambulatory Visit: Payer: Self-pay | Admitting: Internal Medicine

## 2013-12-12 DIAGNOSIS — Z23 Encounter for immunization: Secondary | ICD-10-CM | POA: Diagnosis not present

## 2013-12-26 ENCOUNTER — Other Ambulatory Visit (HOSPITAL_COMMUNITY): Payer: Self-pay

## 2013-12-26 ENCOUNTER — Encounter (HOSPITAL_COMMUNITY)
Admission: RE | Admit: 2013-12-26 | Discharge: 2013-12-26 | Disposition: A | Discharge status: Home / Self Care | Payer: Medicare Other | Source: Ambulatory Visit | Attending: Radiation Oncology | Admitting: Radiation Oncology

## 2013-12-26 DIAGNOSIS — R0602 Shortness of breath: Secondary | ICD-10-CM | POA: Diagnosis not present

## 2013-12-26 DIAGNOSIS — D649 Anemia, unspecified: Secondary | ICD-10-CM | POA: Diagnosis not present

## 2013-12-29 ENCOUNTER — Ambulatory Visit (HOSPITAL_COMMUNITY)
Admission: RE | Admit: 2013-12-29 | Discharge: 2013-12-29 | Disposition: A | Discharge status: Home / Self Care | Payer: Medicare Other | Source: Ambulatory Visit | Attending: Family Medicine | Admitting: Family Medicine

## 2013-12-29 DIAGNOSIS — D6489 Other specified anemias: Secondary | ICD-10-CM | POA: Diagnosis not present

## 2013-12-29 LAB — CBC
HCT: 32.8 % — ABNORMAL LOW (ref 36.0–46.0)
Hemoglobin: 10.2 g/dL — ABNORMAL LOW (ref 12.0–15.0)
MCH: 23.5 pg — ABNORMAL LOW (ref 26.0–34.0)
MCHC: 31.1 g/dL (ref 30.0–36.0)
MCV: 75.6 fL — ABNORMAL LOW (ref 78.0–100.0)
Platelets: 280 10*3/uL (ref 150–400)
RBC: 4.34 MIL/uL (ref 3.87–5.11)
RDW: 17.1 % — ABNORMAL HIGH (ref 11.5–15.5)
WBC: 6.8 10*3/uL (ref 4.0–10.5)

## 2013-12-29 LAB — ABO/RH: ABO/RH(D): O POS

## 2013-12-29 LAB — PREPARE RBC (CROSSMATCH)

## 2013-12-29 MED ORDER — FUROSEMIDE 10 MG/ML IJ SOLN
INTRAMUSCULAR | Status: AC
  Administered 2013-12-29: 20 mg via INTRAVENOUS
  Filled 2013-12-29: qty 2

## 2013-12-29 MED ORDER — SODIUM CHLORIDE 0.9 % IV SOLN: Freq: Once | INTRAVENOUS | Status: DC

## 2013-12-29 MED ORDER — FUROSEMIDE 10 MG/ML IJ SOLN
20.0000 mg | Freq: Once | INTRAMUSCULAR | Status: AC
  Administered 2013-12-29: 20 mg via INTRAVENOUS

## 2013-12-30 ENCOUNTER — Encounter (HOSPITAL_COMMUNITY): Payer: Medicare Other

## 2013-12-30 LAB — TYPE AND SCREEN
ABO/RH(D): O POS
Antibody Screen: NEGATIVE
UNIT DIVISION: 0
UNIT DIVISION: 0

## 2014-01-03 ENCOUNTER — Other Ambulatory Visit: Payer: Self-pay | Admitting: Internal Medicine

## 2014-01-19 DIAGNOSIS — D649 Anemia, unspecified: Secondary | ICD-10-CM | POA: Diagnosis not present

## 2014-01-19 DIAGNOSIS — J069 Acute upper respiratory infection, unspecified: Secondary | ICD-10-CM | POA: Diagnosis not present

## 2014-02-05 DIAGNOSIS — D649 Anemia, unspecified: Secondary | ICD-10-CM | POA: Diagnosis not present

## 2014-03-24 ENCOUNTER — Ambulatory Visit
Admission: RE | Admit: 2014-03-24 | Discharge: 2014-03-24 | Disposition: A | Discharge status: Home / Self Care | Payer: Medicare Other | Source: Ambulatory Visit | Attending: Family Medicine | Admitting: Family Medicine

## 2014-03-24 ENCOUNTER — Other Ambulatory Visit: Payer: Self-pay | Admitting: Family Medicine

## 2014-03-24 DIAGNOSIS — R350 Frequency of micturition: Secondary | ICD-10-CM | POA: Diagnosis not present

## 2014-03-24 DIAGNOSIS — M160 Bilateral primary osteoarthritis of hip: Secondary | ICD-10-CM | POA: Diagnosis not present

## 2014-03-24 DIAGNOSIS — M545 Low back pain: Secondary | ICD-10-CM

## 2014-03-24 DIAGNOSIS — M47816 Spondylosis without myelopathy or radiculopathy, lumbar region: Secondary | ICD-10-CM | POA: Diagnosis not present

## 2014-03-24 DIAGNOSIS — M549 Dorsalgia, unspecified: Secondary | ICD-10-CM | POA: Diagnosis not present

## 2014-03-24 DIAGNOSIS — D509 Iron deficiency anemia, unspecified: Secondary | ICD-10-CM | POA: Diagnosis not present

## 2014-03-24 DIAGNOSIS — M5137 Other intervertebral disc degeneration, lumbosacral region: Secondary | ICD-10-CM | POA: Diagnosis not present

## 2014-03-26 DIAGNOSIS — H5411 Blindness, right eye, low vision left eye: Secondary | ICD-10-CM | POA: Diagnosis not present

## 2014-03-26 DIAGNOSIS — E119 Type 2 diabetes mellitus without complications: Secondary | ICD-10-CM | POA: Diagnosis not present

## 2014-03-26 DIAGNOSIS — K219 Gastro-esophageal reflux disease without esophagitis: Secondary | ICD-10-CM | POA: Diagnosis not present

## 2014-03-26 DIAGNOSIS — M1990 Unspecified osteoarthritis, unspecified site: Secondary | ICD-10-CM | POA: Diagnosis not present

## 2014-03-26 DIAGNOSIS — E785 Hyperlipidemia, unspecified: Secondary | ICD-10-CM | POA: Diagnosis not present

## 2014-03-26 DIAGNOSIS — I10 Essential (primary) hypertension: Secondary | ICD-10-CM | POA: Diagnosis not present

## 2014-03-29 DIAGNOSIS — H5411 Blindness, right eye, low vision left eye: Secondary | ICD-10-CM | POA: Diagnosis not present

## 2014-03-29 DIAGNOSIS — M1990 Unspecified osteoarthritis, unspecified site: Secondary | ICD-10-CM | POA: Diagnosis not present

## 2014-03-29 DIAGNOSIS — I10 Essential (primary) hypertension: Secondary | ICD-10-CM | POA: Diagnosis not present

## 2014-03-29 DIAGNOSIS — K219 Gastro-esophageal reflux disease without esophagitis: Secondary | ICD-10-CM | POA: Diagnosis not present

## 2014-03-29 DIAGNOSIS — E119 Type 2 diabetes mellitus without complications: Secondary | ICD-10-CM | POA: Diagnosis not present

## 2014-03-29 DIAGNOSIS — E785 Hyperlipidemia, unspecified: Secondary | ICD-10-CM | POA: Diagnosis not present

## 2014-03-30 DIAGNOSIS — K219 Gastro-esophageal reflux disease without esophagitis: Secondary | ICD-10-CM | POA: Diagnosis not present

## 2014-03-30 DIAGNOSIS — E119 Type 2 diabetes mellitus without complications: Secondary | ICD-10-CM | POA: Diagnosis not present

## 2014-03-30 DIAGNOSIS — E785 Hyperlipidemia, unspecified: Secondary | ICD-10-CM | POA: Diagnosis not present

## 2014-03-30 DIAGNOSIS — M1990 Unspecified osteoarthritis, unspecified site: Secondary | ICD-10-CM | POA: Diagnosis not present

## 2014-03-30 DIAGNOSIS — H5411 Blindness, right eye, low vision left eye: Secondary | ICD-10-CM | POA: Diagnosis not present

## 2014-03-30 DIAGNOSIS — I10 Essential (primary) hypertension: Secondary | ICD-10-CM | POA: Diagnosis not present

## 2014-04-01 DIAGNOSIS — K219 Gastro-esophageal reflux disease without esophagitis: Secondary | ICD-10-CM | POA: Diagnosis not present

## 2014-04-01 DIAGNOSIS — E119 Type 2 diabetes mellitus without complications: Secondary | ICD-10-CM | POA: Diagnosis not present

## 2014-04-01 DIAGNOSIS — H5411 Blindness, right eye, low vision left eye: Secondary | ICD-10-CM | POA: Diagnosis not present

## 2014-04-01 DIAGNOSIS — I10 Essential (primary) hypertension: Secondary | ICD-10-CM | POA: Diagnosis not present

## 2014-04-01 DIAGNOSIS — M1990 Unspecified osteoarthritis, unspecified site: Secondary | ICD-10-CM | POA: Diagnosis not present

## 2014-04-01 DIAGNOSIS — E785 Hyperlipidemia, unspecified: Secondary | ICD-10-CM | POA: Diagnosis not present

## 2014-04-02 DIAGNOSIS — E119 Type 2 diabetes mellitus without complications: Secondary | ICD-10-CM | POA: Diagnosis not present

## 2014-04-02 DIAGNOSIS — M1990 Unspecified osteoarthritis, unspecified site: Secondary | ICD-10-CM | POA: Diagnosis not present

## 2014-04-02 DIAGNOSIS — I10 Essential (primary) hypertension: Secondary | ICD-10-CM | POA: Diagnosis not present

## 2014-04-02 DIAGNOSIS — K219 Gastro-esophageal reflux disease without esophagitis: Secondary | ICD-10-CM | POA: Diagnosis not present

## 2014-04-02 DIAGNOSIS — H5411 Blindness, right eye, low vision left eye: Secondary | ICD-10-CM | POA: Diagnosis not present

## 2014-04-02 DIAGNOSIS — E785 Hyperlipidemia, unspecified: Secondary | ICD-10-CM | POA: Diagnosis not present

## 2014-04-03 DIAGNOSIS — K219 Gastro-esophageal reflux disease without esophagitis: Secondary | ICD-10-CM | POA: Diagnosis not present

## 2014-04-03 DIAGNOSIS — E119 Type 2 diabetes mellitus without complications: Secondary | ICD-10-CM | POA: Diagnosis not present

## 2014-04-03 DIAGNOSIS — H5411 Blindness, right eye, low vision left eye: Secondary | ICD-10-CM | POA: Diagnosis not present

## 2014-04-03 DIAGNOSIS — M1990 Unspecified osteoarthritis, unspecified site: Secondary | ICD-10-CM | POA: Diagnosis not present

## 2014-04-03 DIAGNOSIS — E785 Hyperlipidemia, unspecified: Secondary | ICD-10-CM | POA: Diagnosis not present

## 2014-04-03 DIAGNOSIS — I10 Essential (primary) hypertension: Secondary | ICD-10-CM | POA: Diagnosis not present

## 2014-04-07 DIAGNOSIS — K219 Gastro-esophageal reflux disease without esophagitis: Secondary | ICD-10-CM | POA: Diagnosis not present

## 2014-04-07 DIAGNOSIS — I10 Essential (primary) hypertension: Secondary | ICD-10-CM | POA: Diagnosis not present

## 2014-04-07 DIAGNOSIS — M1990 Unspecified osteoarthritis, unspecified site: Secondary | ICD-10-CM | POA: Diagnosis not present

## 2014-04-07 DIAGNOSIS — E785 Hyperlipidemia, unspecified: Secondary | ICD-10-CM | POA: Diagnosis not present

## 2014-04-07 DIAGNOSIS — H5411 Blindness, right eye, low vision left eye: Secondary | ICD-10-CM | POA: Diagnosis not present

## 2014-04-07 DIAGNOSIS — E119 Type 2 diabetes mellitus without complications: Secondary | ICD-10-CM | POA: Diagnosis not present

## 2014-04-09 DIAGNOSIS — K219 Gastro-esophageal reflux disease without esophagitis: Secondary | ICD-10-CM | POA: Diagnosis not present

## 2014-04-09 DIAGNOSIS — M1990 Unspecified osteoarthritis, unspecified site: Secondary | ICD-10-CM | POA: Diagnosis not present

## 2014-04-09 DIAGNOSIS — H5411 Blindness, right eye, low vision left eye: Secondary | ICD-10-CM | POA: Diagnosis not present

## 2014-04-09 DIAGNOSIS — E119 Type 2 diabetes mellitus without complications: Secondary | ICD-10-CM | POA: Diagnosis not present

## 2014-04-09 DIAGNOSIS — E785 Hyperlipidemia, unspecified: Secondary | ICD-10-CM | POA: Diagnosis not present

## 2014-04-09 DIAGNOSIS — I10 Essential (primary) hypertension: Secondary | ICD-10-CM | POA: Diagnosis not present

## 2014-04-10 DIAGNOSIS — M1990 Unspecified osteoarthritis, unspecified site: Secondary | ICD-10-CM | POA: Diagnosis not present

## 2014-04-10 DIAGNOSIS — I10 Essential (primary) hypertension: Secondary | ICD-10-CM | POA: Diagnosis not present

## 2014-04-10 DIAGNOSIS — E119 Type 2 diabetes mellitus without complications: Secondary | ICD-10-CM | POA: Diagnosis not present

## 2014-04-10 DIAGNOSIS — K219 Gastro-esophageal reflux disease without esophagitis: Secondary | ICD-10-CM | POA: Diagnosis not present

## 2014-04-10 DIAGNOSIS — H5411 Blindness, right eye, low vision left eye: Secondary | ICD-10-CM | POA: Diagnosis not present

## 2014-04-10 DIAGNOSIS — E785 Hyperlipidemia, unspecified: Secondary | ICD-10-CM | POA: Diagnosis not present

## 2014-04-14 DIAGNOSIS — E119 Type 2 diabetes mellitus without complications: Secondary | ICD-10-CM | POA: Diagnosis not present

## 2014-04-14 DIAGNOSIS — H5411 Blindness, right eye, low vision left eye: Secondary | ICD-10-CM | POA: Diagnosis not present

## 2014-04-14 DIAGNOSIS — I10 Essential (primary) hypertension: Secondary | ICD-10-CM | POA: Diagnosis not present

## 2014-04-14 DIAGNOSIS — K219 Gastro-esophageal reflux disease without esophagitis: Secondary | ICD-10-CM | POA: Diagnosis not present

## 2014-04-14 DIAGNOSIS — E785 Hyperlipidemia, unspecified: Secondary | ICD-10-CM | POA: Diagnosis not present

## 2014-04-14 DIAGNOSIS — M1990 Unspecified osteoarthritis, unspecified site: Secondary | ICD-10-CM | POA: Diagnosis not present

## 2014-04-15 DIAGNOSIS — K219 Gastro-esophageal reflux disease without esophagitis: Secondary | ICD-10-CM | POA: Diagnosis not present

## 2014-04-15 DIAGNOSIS — E119 Type 2 diabetes mellitus without complications: Secondary | ICD-10-CM | POA: Diagnosis not present

## 2014-04-15 DIAGNOSIS — M1990 Unspecified osteoarthritis, unspecified site: Secondary | ICD-10-CM | POA: Diagnosis not present

## 2014-04-15 DIAGNOSIS — H5411 Blindness, right eye, low vision left eye: Secondary | ICD-10-CM | POA: Diagnosis not present

## 2014-04-15 DIAGNOSIS — E785 Hyperlipidemia, unspecified: Secondary | ICD-10-CM | POA: Diagnosis not present

## 2014-04-15 DIAGNOSIS — I10 Essential (primary) hypertension: Secondary | ICD-10-CM | POA: Diagnosis not present

## 2014-04-16 DIAGNOSIS — E119 Type 2 diabetes mellitus without complications: Secondary | ICD-10-CM | POA: Diagnosis not present

## 2014-04-16 DIAGNOSIS — E785 Hyperlipidemia, unspecified: Secondary | ICD-10-CM | POA: Diagnosis not present

## 2014-04-16 DIAGNOSIS — M1990 Unspecified osteoarthritis, unspecified site: Secondary | ICD-10-CM | POA: Diagnosis not present

## 2014-04-16 DIAGNOSIS — H5411 Blindness, right eye, low vision left eye: Secondary | ICD-10-CM | POA: Diagnosis not present

## 2014-04-16 DIAGNOSIS — I10 Essential (primary) hypertension: Secondary | ICD-10-CM | POA: Diagnosis not present

## 2014-04-16 DIAGNOSIS — K219 Gastro-esophageal reflux disease without esophagitis: Secondary | ICD-10-CM | POA: Diagnosis not present

## 2014-04-21 DIAGNOSIS — E785 Hyperlipidemia, unspecified: Secondary | ICD-10-CM | POA: Diagnosis not present

## 2014-04-21 DIAGNOSIS — K219 Gastro-esophageal reflux disease without esophagitis: Secondary | ICD-10-CM | POA: Diagnosis not present

## 2014-04-21 DIAGNOSIS — H5411 Blindness, right eye, low vision left eye: Secondary | ICD-10-CM | POA: Diagnosis not present

## 2014-04-21 DIAGNOSIS — E119 Type 2 diabetes mellitus without complications: Secondary | ICD-10-CM | POA: Diagnosis not present

## 2014-04-21 DIAGNOSIS — I10 Essential (primary) hypertension: Secondary | ICD-10-CM | POA: Diagnosis not present

## 2014-04-21 DIAGNOSIS — M1990 Unspecified osteoarthritis, unspecified site: Secondary | ICD-10-CM | POA: Diagnosis not present

## 2014-04-22 DIAGNOSIS — E785 Hyperlipidemia, unspecified: Secondary | ICD-10-CM | POA: Diagnosis not present

## 2014-04-22 DIAGNOSIS — I10 Essential (primary) hypertension: Secondary | ICD-10-CM | POA: Diagnosis not present

## 2014-04-22 DIAGNOSIS — K219 Gastro-esophageal reflux disease without esophagitis: Secondary | ICD-10-CM | POA: Diagnosis not present

## 2014-04-22 DIAGNOSIS — M1990 Unspecified osteoarthritis, unspecified site: Secondary | ICD-10-CM | POA: Diagnosis not present

## 2014-04-22 DIAGNOSIS — E119 Type 2 diabetes mellitus without complications: Secondary | ICD-10-CM | POA: Diagnosis not present

## 2014-04-22 DIAGNOSIS — H5411 Blindness, right eye, low vision left eye: Secondary | ICD-10-CM | POA: Diagnosis not present

## 2014-04-23 DIAGNOSIS — H5411 Blindness, right eye, low vision left eye: Secondary | ICD-10-CM | POA: Diagnosis not present

## 2014-04-23 DIAGNOSIS — E119 Type 2 diabetes mellitus without complications: Secondary | ICD-10-CM | POA: Diagnosis not present

## 2014-04-23 DIAGNOSIS — I10 Essential (primary) hypertension: Secondary | ICD-10-CM | POA: Diagnosis not present

## 2014-04-23 DIAGNOSIS — M1990 Unspecified osteoarthritis, unspecified site: Secondary | ICD-10-CM | POA: Diagnosis not present

## 2014-04-23 DIAGNOSIS — K219 Gastro-esophageal reflux disease without esophagitis: Secondary | ICD-10-CM | POA: Diagnosis not present

## 2014-04-23 DIAGNOSIS — E785 Hyperlipidemia, unspecified: Secondary | ICD-10-CM | POA: Diagnosis not present

## 2014-04-30 DIAGNOSIS — K219 Gastro-esophageal reflux disease without esophagitis: Secondary | ICD-10-CM | POA: Diagnosis not present

## 2014-04-30 DIAGNOSIS — E785 Hyperlipidemia, unspecified: Secondary | ICD-10-CM | POA: Diagnosis not present

## 2014-04-30 DIAGNOSIS — H5411 Blindness, right eye, low vision left eye: Secondary | ICD-10-CM | POA: Diagnosis not present

## 2014-04-30 DIAGNOSIS — M1990 Unspecified osteoarthritis, unspecified site: Secondary | ICD-10-CM | POA: Diagnosis not present

## 2014-04-30 DIAGNOSIS — E119 Type 2 diabetes mellitus without complications: Secondary | ICD-10-CM | POA: Diagnosis not present

## 2014-04-30 DIAGNOSIS — I10 Essential (primary) hypertension: Secondary | ICD-10-CM | POA: Diagnosis not present

## 2014-05-07 DIAGNOSIS — K219 Gastro-esophageal reflux disease without esophagitis: Secondary | ICD-10-CM | POA: Diagnosis not present

## 2014-05-07 DIAGNOSIS — E785 Hyperlipidemia, unspecified: Secondary | ICD-10-CM | POA: Diagnosis not present

## 2014-05-07 DIAGNOSIS — I10 Essential (primary) hypertension: Secondary | ICD-10-CM | POA: Diagnosis not present

## 2014-05-07 DIAGNOSIS — M1990 Unspecified osteoarthritis, unspecified site: Secondary | ICD-10-CM | POA: Diagnosis not present

## 2014-05-07 DIAGNOSIS — E119 Type 2 diabetes mellitus without complications: Secondary | ICD-10-CM | POA: Diagnosis not present

## 2014-05-07 DIAGNOSIS — H5411 Blindness, right eye, low vision left eye: Secondary | ICD-10-CM | POA: Diagnosis not present

## 2014-05-12 DIAGNOSIS — K219 Gastro-esophageal reflux disease without esophagitis: Secondary | ICD-10-CM | POA: Diagnosis not present

## 2014-05-12 DIAGNOSIS — H5411 Blindness, right eye, low vision left eye: Secondary | ICD-10-CM | POA: Diagnosis not present

## 2014-05-12 DIAGNOSIS — M1990 Unspecified osteoarthritis, unspecified site: Secondary | ICD-10-CM | POA: Diagnosis not present

## 2014-05-12 DIAGNOSIS — E785 Hyperlipidemia, unspecified: Secondary | ICD-10-CM | POA: Diagnosis not present

## 2014-05-12 DIAGNOSIS — I10 Essential (primary) hypertension: Secondary | ICD-10-CM | POA: Diagnosis not present

## 2014-05-12 DIAGNOSIS — E119 Type 2 diabetes mellitus without complications: Secondary | ICD-10-CM | POA: Diagnosis not present

## 2014-05-26 DIAGNOSIS — K219 Gastro-esophageal reflux disease without esophagitis: Secondary | ICD-10-CM | POA: Diagnosis not present

## 2014-05-26 DIAGNOSIS — E782 Mixed hyperlipidemia: Secondary | ICD-10-CM | POA: Diagnosis not present

## 2014-05-26 DIAGNOSIS — Z0001 Encounter for general adult medical examination with abnormal findings: Secondary | ICD-10-CM | POA: Diagnosis not present

## 2014-05-26 DIAGNOSIS — N3281 Overactive bladder: Secondary | ICD-10-CM | POA: Diagnosis not present

## 2014-05-26 DIAGNOSIS — D509 Iron deficiency anemia, unspecified: Secondary | ICD-10-CM | POA: Diagnosis not present

## 2014-05-26 DIAGNOSIS — E1122 Type 2 diabetes mellitus with diabetic chronic kidney disease: Secondary | ICD-10-CM | POA: Diagnosis not present

## 2014-05-26 DIAGNOSIS — R413 Other amnesia: Secondary | ICD-10-CM | POA: Diagnosis not present

## 2014-05-26 DIAGNOSIS — I700 Atherosclerosis of aorta: Secondary | ICD-10-CM | POA: Diagnosis not present

## 2014-05-26 DIAGNOSIS — I129 Hypertensive chronic kidney disease with stage 1 through stage 4 chronic kidney disease, or unspecified chronic kidney disease: Secondary | ICD-10-CM | POA: Diagnosis not present

## 2014-05-26 DIAGNOSIS — M8580 Other specified disorders of bone density and structure, unspecified site: Secondary | ICD-10-CM | POA: Diagnosis not present

## 2014-05-26 DIAGNOSIS — N183 Chronic kidney disease, stage 3 (moderate): Secondary | ICD-10-CM | POA: Diagnosis not present

## 2014-05-26 DIAGNOSIS — M5137 Other intervertebral disc degeneration, lumbosacral region: Secondary | ICD-10-CM | POA: Diagnosis not present

## 2014-09-28 DIAGNOSIS — S7002XA Contusion of left hip, initial encounter: Secondary | ICD-10-CM | POA: Diagnosis not present

## 2014-09-28 DIAGNOSIS — R109 Unspecified abdominal pain: Secondary | ICD-10-CM | POA: Diagnosis not present

## 2014-09-28 DIAGNOSIS — S40011A Contusion of right shoulder, initial encounter: Secondary | ICD-10-CM | POA: Diagnosis not present

## 2014-09-28 DIAGNOSIS — W19XXXA Unspecified fall, initial encounter: Secondary | ICD-10-CM | POA: Diagnosis not present

## 2014-10-19 DIAGNOSIS — H3531 Nonexudative age-related macular degeneration: Secondary | ICD-10-CM | POA: Diagnosis not present

## 2014-10-19 DIAGNOSIS — H2521 Age-related cataract, morgagnian type, right eye: Secondary | ICD-10-CM | POA: Diagnosis not present

## 2014-10-19 DIAGNOSIS — Z961 Presence of intraocular lens: Secondary | ICD-10-CM | POA: Diagnosis not present

## 2014-10-22 IMAGING — US US ABDOMEN COMPLETE
1 series · 14 of 25 positions shown · non-contrast
Comparison: 02/25/2012

CLINICAL DATA: Right upper quadrant pain

EXAM:
ULTRASOUND ABDOMEN COMPLETE

[Series 1: us abdomen complete · 0.20mm/px · 14 of 52 slices shown]
[im 1/52]
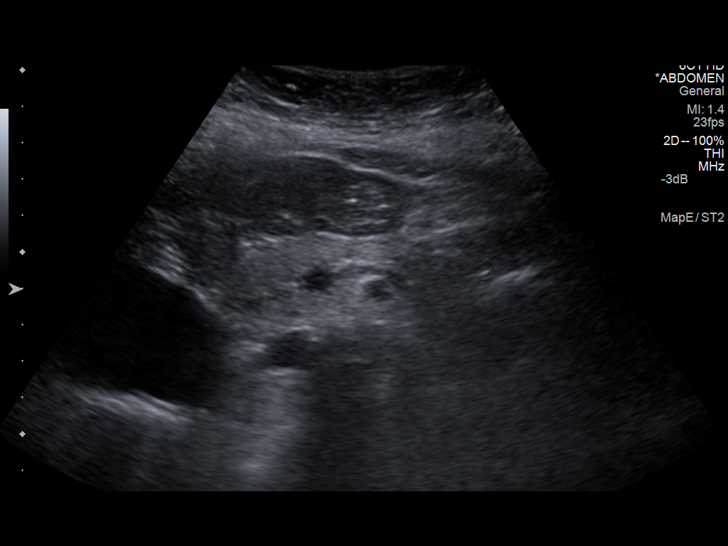
[im 5/52]
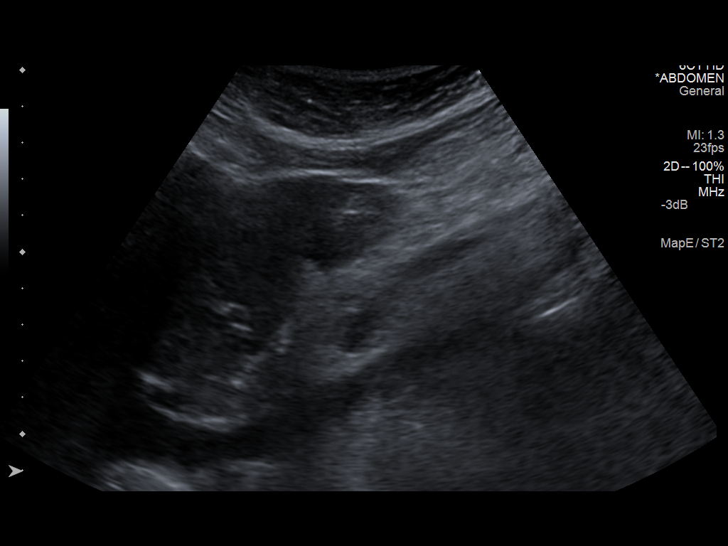
[im 9/52]
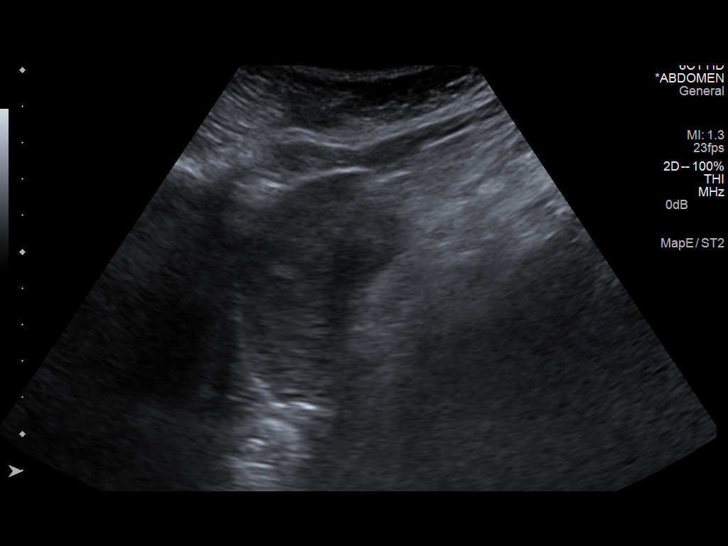
[im 13/52]
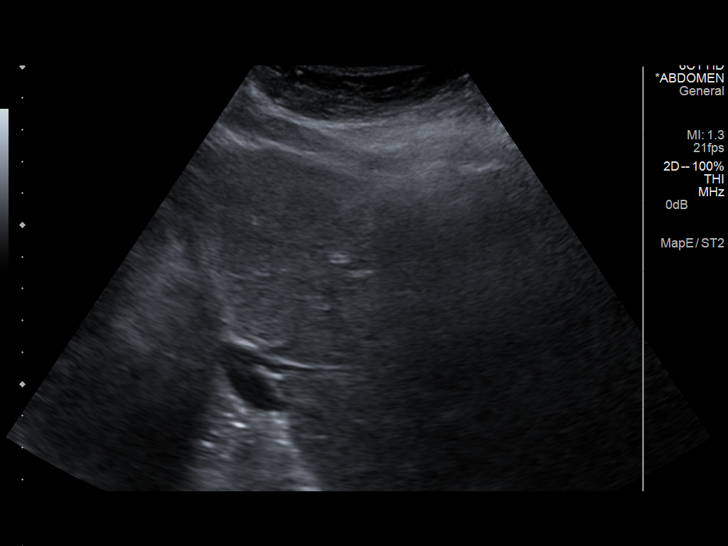
[im 18/52]
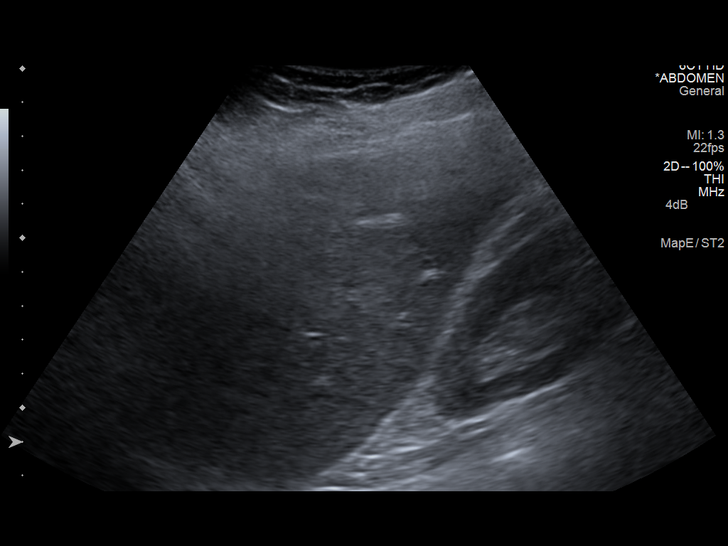
[im 20/52]
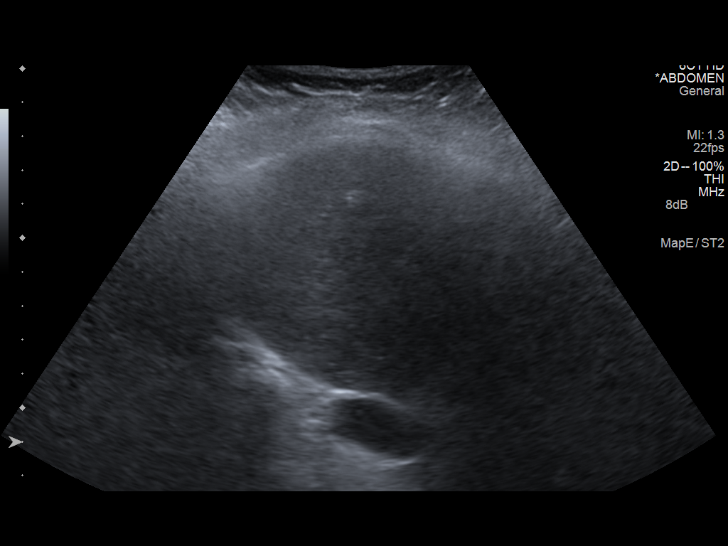
[im 24/52]
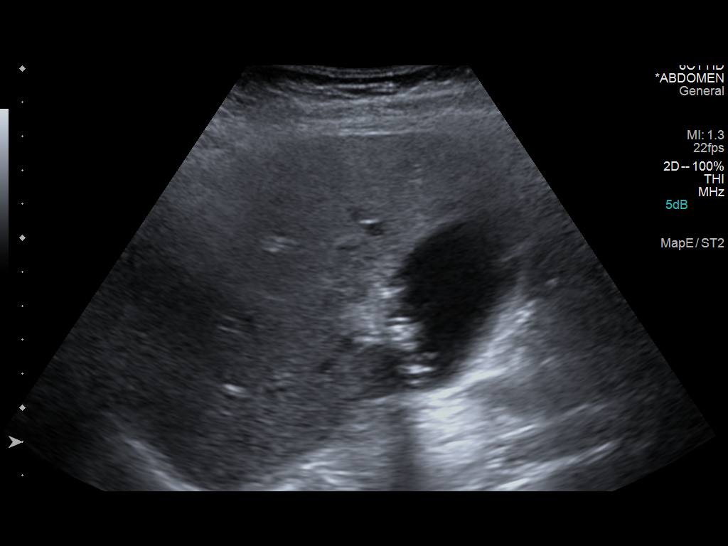
[im 28/52]
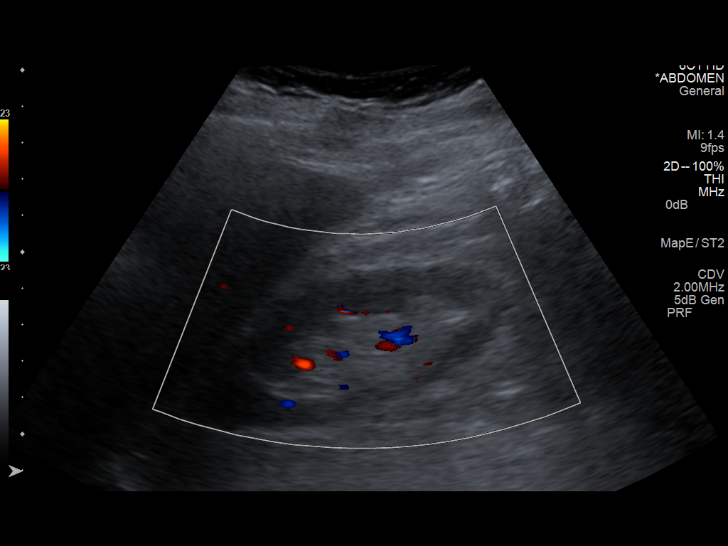
[im 32/52]
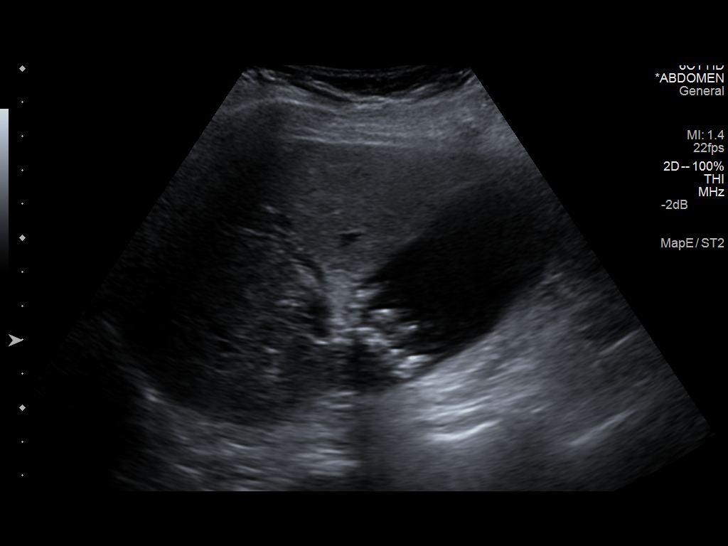
[im 35/52]
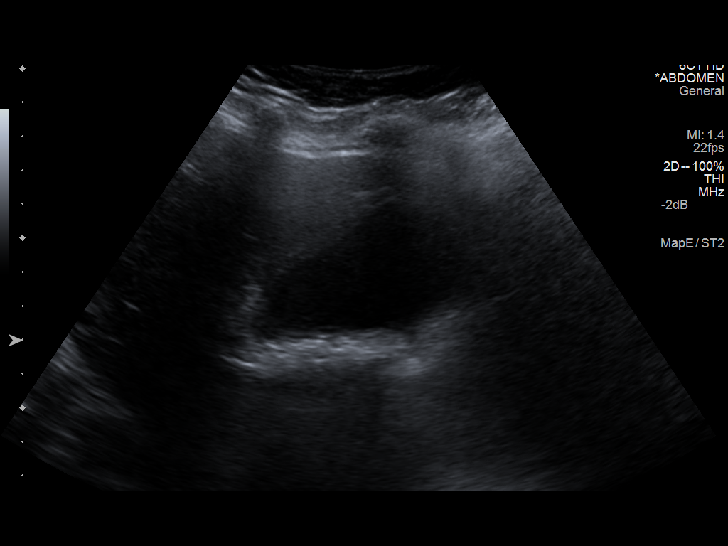
[im 39/52]
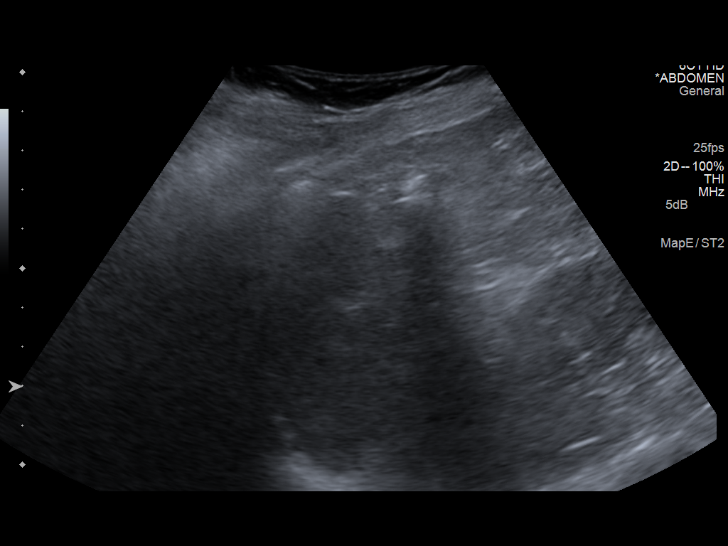
[im 43/52]
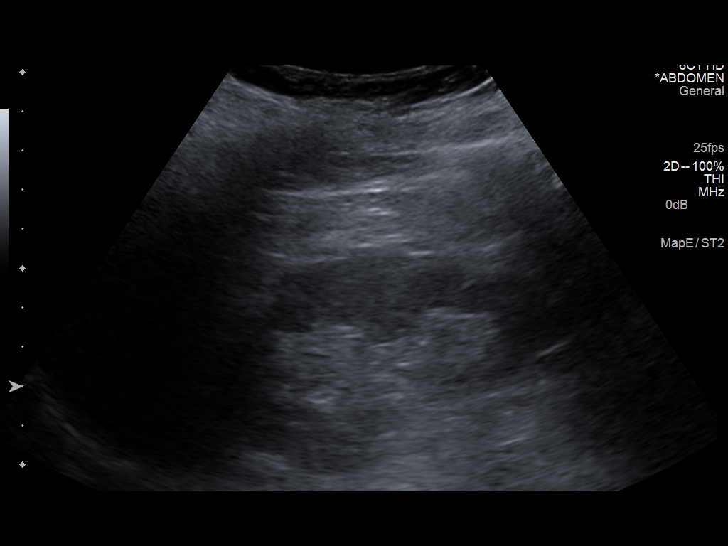
[im 47/52]
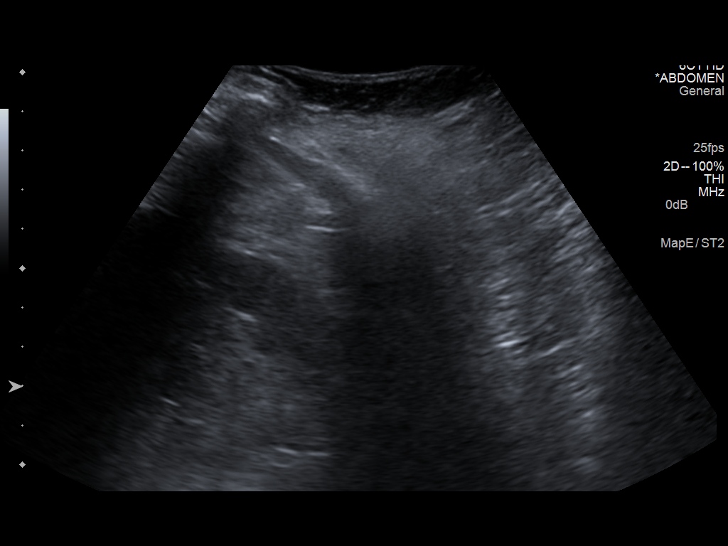
[im 52/52]
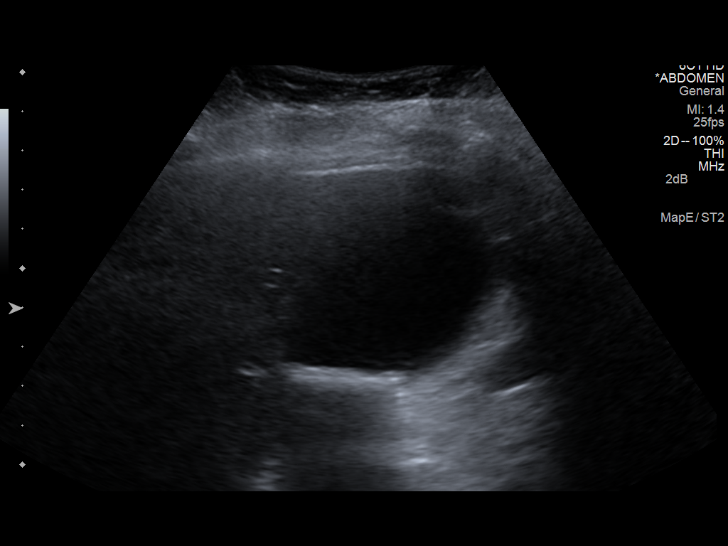

[14 of 25 positions shown; findings below may reference images not displayed]

FINDINGS: Gallbladder:

Cholelithiasis. The gallbladder wall is at the upper limits of
normal. A negative sonographic Murphy sign is noted.

Common bile duct:

Diameter: 4 mm

Liver:

No focal lesion identified. Within normal limits in parenchymal
echogenicity.

IVC:

No abnormality visualized.

Pancreas:

Visualized portion unremarkable.

Spleen:

Size and appearance within normal limits.

Right Kidney:

Length: 10 cm.. Echogenicity within normal limits. No mass or
hydronephrosis visualized.

Left Kidney:

Length: 10 cm.. Echogenicity within normal limits. No mass or
hydronephrosis visualized.

Abdominal aorta:

Atherosclerotic changes are noted without aneurysmal dilatation.

Other findings:

None.
IMPRESSION: Cholelithiasis

## 2014-11-26 DIAGNOSIS — E782 Mixed hyperlipidemia: Secondary | ICD-10-CM | POA: Diagnosis not present

## 2014-11-26 DIAGNOSIS — I129 Hypertensive chronic kidney disease with stage 1 through stage 4 chronic kidney disease, or unspecified chronic kidney disease: Secondary | ICD-10-CM | POA: Diagnosis not present

## 2014-11-26 DIAGNOSIS — R413 Other amnesia: Secondary | ICD-10-CM | POA: Diagnosis not present

## 2014-11-26 DIAGNOSIS — E1122 Type 2 diabetes mellitus with diabetic chronic kidney disease: Secondary | ICD-10-CM | POA: Diagnosis not present

## 2014-11-26 DIAGNOSIS — M79606 Pain in leg, unspecified: Secondary | ICD-10-CM | POA: Diagnosis not present

## 2014-11-26 DIAGNOSIS — Z23 Encounter for immunization: Secondary | ICD-10-CM | POA: Diagnosis not present

## 2014-11-26 DIAGNOSIS — N183 Chronic kidney disease, stage 3 (moderate): Secondary | ICD-10-CM | POA: Diagnosis not present

## 2015-03-25 DIAGNOSIS — Z7984 Long term (current) use of oral hypoglycemic drugs: Secondary | ICD-10-CM | POA: Diagnosis not present

## 2015-03-25 DIAGNOSIS — N183 Chronic kidney disease, stage 3 (moderate): Secondary | ICD-10-CM | POA: Diagnosis not present

## 2015-03-25 DIAGNOSIS — R413 Other amnesia: Secondary | ICD-10-CM | POA: Diagnosis not present

## 2015-03-25 DIAGNOSIS — I129 Hypertensive chronic kidney disease with stage 1 through stage 4 chronic kidney disease, or unspecified chronic kidney disease: Secondary | ICD-10-CM | POA: Diagnosis not present

## 2015-03-25 DIAGNOSIS — I700 Atherosclerosis of aorta: Secondary | ICD-10-CM | POA: Diagnosis not present

## 2015-03-25 DIAGNOSIS — E1122 Type 2 diabetes mellitus with diabetic chronic kidney disease: Secondary | ICD-10-CM | POA: Diagnosis not present

## 2015-03-25 DIAGNOSIS — M5137 Other intervertebral disc degeneration, lumbosacral region: Secondary | ICD-10-CM | POA: Diagnosis not present

## 2015-03-25 DIAGNOSIS — E782 Mixed hyperlipidemia: Secondary | ICD-10-CM | POA: Diagnosis not present

## 2015-04-05 ENCOUNTER — Emergency Department (HOSPITAL_COMMUNITY): Payer: Medicare Other

## 2015-04-05 ENCOUNTER — Inpatient Hospital Stay (HOSPITAL_COMMUNITY): Payer: Medicare Other

## 2015-04-05 ENCOUNTER — Inpatient Hospital Stay (HOSPITAL_COMMUNITY)
Admission: EM | Admit: 2015-04-05 | Discharge: 2015-04-07 | DRG: 871 | Disposition: A | Discharge status: Home Health | Payer: Medicare Other | Attending: Internal Medicine | Admitting: Internal Medicine

## 2015-04-05 ENCOUNTER — Encounter (HOSPITAL_COMMUNITY): Payer: Self-pay

## 2015-04-05 DIAGNOSIS — M542 Cervicalgia: Secondary | ICD-10-CM | POA: Diagnosis not present

## 2015-04-05 DIAGNOSIS — R410 Disorientation, unspecified: Secondary | ICD-10-CM

## 2015-04-05 DIAGNOSIS — A419 Sepsis, unspecified organism: Secondary | ICD-10-CM | POA: Diagnosis not present

## 2015-04-05 DIAGNOSIS — Z7902 Long term (current) use of antithrombotics/antiplatelets: Secondary | ICD-10-CM | POA: Diagnosis not present

## 2015-04-05 DIAGNOSIS — R4182 Altered mental status, unspecified: Secondary | ICD-10-CM | POA: Diagnosis not present

## 2015-04-05 DIAGNOSIS — R296 Repeated falls: Secondary | ICD-10-CM | POA: Diagnosis present

## 2015-04-05 DIAGNOSIS — N390 Urinary tract infection, site not specified: Secondary | ICD-10-CM | POA: Diagnosis present

## 2015-04-05 DIAGNOSIS — E785 Hyperlipidemia, unspecified: Secondary | ICD-10-CM | POA: Diagnosis present

## 2015-04-05 DIAGNOSIS — T68XXXA Hypothermia, initial encounter: Secondary | ICD-10-CM | POA: Diagnosis present

## 2015-04-05 DIAGNOSIS — M25551 Pain in right hip: Secondary | ICD-10-CM | POA: Diagnosis not present

## 2015-04-05 DIAGNOSIS — S79912A Unspecified injury of left hip, initial encounter: Secondary | ICD-10-CM | POA: Diagnosis not present

## 2015-04-05 DIAGNOSIS — E860 Dehydration: Secondary | ICD-10-CM | POA: Diagnosis present

## 2015-04-05 DIAGNOSIS — Z79899 Other long term (current) drug therapy: Secondary | ICD-10-CM | POA: Diagnosis not present

## 2015-04-05 DIAGNOSIS — E119 Type 2 diabetes mellitus without complications: Secondary | ICD-10-CM | POA: Diagnosis present

## 2015-04-05 DIAGNOSIS — S299XXA Unspecified injury of thorax, initial encounter: Secondary | ICD-10-CM | POA: Diagnosis not present

## 2015-04-05 DIAGNOSIS — G309 Alzheimer's disease, unspecified: Secondary | ICD-10-CM | POA: Diagnosis present

## 2015-04-05 DIAGNOSIS — F0280 Dementia in other diseases classified elsewhere without behavioral disturbance: Secondary | ICD-10-CM | POA: Diagnosis present

## 2015-04-05 DIAGNOSIS — Z888 Allergy status to other drugs, medicaments and biological substances status: Secondary | ICD-10-CM

## 2015-04-05 DIAGNOSIS — R358 Other polyuria: Secondary | ICD-10-CM | POA: Diagnosis present

## 2015-04-05 DIAGNOSIS — R51 Headache: Secondary | ICD-10-CM | POA: Diagnosis not present

## 2015-04-05 DIAGNOSIS — R443 Hallucinations, unspecified: Secondary | ICD-10-CM

## 2015-04-05 DIAGNOSIS — G9340 Encephalopathy, unspecified: Secondary | ICD-10-CM | POA: Diagnosis present

## 2015-04-05 DIAGNOSIS — Y92009 Unspecified place in unspecified non-institutional (private) residence as the place of occurrence of the external cause: Secondary | ICD-10-CM

## 2015-04-05 DIAGNOSIS — K219 Gastro-esophageal reflux disease without esophagitis: Secondary | ICD-10-CM | POA: Diagnosis present

## 2015-04-05 DIAGNOSIS — W19XXXA Unspecified fall, initial encounter: Secondary | ICD-10-CM

## 2015-04-05 DIAGNOSIS — I10 Essential (primary) hypertension: Secondary | ICD-10-CM | POA: Diagnosis not present

## 2015-04-05 DIAGNOSIS — N179 Acute kidney failure, unspecified: Secondary | ICD-10-CM | POA: Diagnosis present

## 2015-04-05 DIAGNOSIS — W1830XA Fall on same level, unspecified, initial encounter: Secondary | ICD-10-CM | POA: Diagnosis present

## 2015-04-05 DIAGNOSIS — M1990 Unspecified osteoarthritis, unspecified site: Secondary | ICD-10-CM | POA: Diagnosis present

## 2015-04-05 DIAGNOSIS — F29 Unspecified psychosis not due to a substance or known physiological condition: Secondary | ICD-10-CM | POA: Diagnosis not present

## 2015-04-05 LAB — CBC WITH DIFFERENTIAL/PLATELET
BASOS ABS: 0 10*3/uL (ref 0.0–0.1)
Basophils Relative: 0 %
Eosinophils Absolute: 0 10*3/uL (ref 0.0–0.7)
Eosinophils Relative: 0 %
HCT: 39.8 % (ref 36.0–46.0)
Hemoglobin: 13.2 g/dL (ref 12.0–15.0)
LYMPHS PCT: 9 %
Lymphs Abs: 1 10*3/uL (ref 0.7–4.0)
MCH: 28.2 pg (ref 26.0–34.0)
MCHC: 33.2 g/dL (ref 30.0–36.0)
MCV: 85 fL (ref 78.0–100.0)
MONO ABS: 1.1 10*3/uL — AB (ref 0.1–1.0)
MONOS PCT: 10 %
Neutro Abs: 8.8 10*3/uL — ABNORMAL HIGH (ref 1.7–7.7)
Neutrophils Relative %: 81 %
PLATELETS: 300 10*3/uL (ref 150–400)
RBC: 4.68 MIL/uL (ref 3.87–5.11)
RDW: 15.4 % (ref 11.5–15.5)
WBC: 10.9 10*3/uL — ABNORMAL HIGH (ref 4.0–10.5)

## 2015-04-05 LAB — COMPREHENSIVE METABOLIC PANEL
ALT: 21 U/L (ref 14–54)
ANION GAP: 14 (ref 5–15)
AST: 39 U/L (ref 15–41)
Albumin: 3.4 g/dL — ABNORMAL LOW (ref 3.5–5.0)
Alkaline Phosphatase: 80 U/L (ref 38–126)
BUN: 35 mg/dL — AB (ref 6–20)
CHLORIDE: 104 mmol/L (ref 101–111)
CO2: 23 mmol/L (ref 22–32)
Calcium: 9.7 mg/dL (ref 8.9–10.3)
Creatinine, Ser: 1.37 mg/dL — ABNORMAL HIGH (ref 0.44–1.00)
GFR calc Af Amer: 38 mL/min — ABNORMAL LOW (ref >60)
GFR, EST NON AFRICAN AMERICAN: 33 mL/min — AB (ref >60)
Glucose, Bld: 170 mg/dL — ABNORMAL HIGH (ref 65–99)
POTASSIUM: 4.4 mmol/L (ref 3.5–5.1)
Sodium: 141 mmol/L (ref 135–145)
TOTAL PROTEIN: 6.9 g/dL (ref 6.5–8.1)
Total Bilirubin: 0.7 mg/dL (ref 0.3–1.2)

## 2015-04-05 LAB — GLUCOSE, CAPILLARY: Glucose-Capillary: 168 mg/dL — ABNORMAL HIGH (ref 65–99)

## 2015-04-05 LAB — TSH: TSH: 2.055 u[IU]/mL (ref 0.350–4.500)

## 2015-04-05 LAB — URINE MICROSCOPIC-ADD ON

## 2015-04-05 LAB — PROCALCITONIN

## 2015-04-05 LAB — URINALYSIS, ROUTINE W REFLEX MICROSCOPIC
Bilirubin Urine: NEGATIVE
GLUCOSE, UA: 100 mg/dL — AB
KETONES UR: NEGATIVE mg/dL
NITRITE: NEGATIVE
PH: 5 (ref 5.0–8.0)
Protein, ur: 30 mg/dL — AB
SPECIFIC GRAVITY, URINE: 1.017 (ref 1.005–1.030)

## 2015-04-05 LAB — I-STAT TROPONIN, ED: Troponin i, poc: 0.02 ng/mL (ref 0.00–0.08)

## 2015-04-05 LAB — APTT: APTT: 30 s (ref 24–37)

## 2015-04-05 LAB — VITAMIN B12: VITAMIN B 12: 652 pg/mL (ref 180–914)

## 2015-04-05 LAB — LACTIC ACID, PLASMA
LACTIC ACID, VENOUS: 1.8 mmol/L (ref 0.5–2.0)
Lactic Acid, Venous: 1.1 mmol/L (ref 0.5–2.0)

## 2015-04-05 LAB — PROTIME-INR
INR: 0.98 (ref 0.00–1.49)
PROTHROMBIN TIME: 13.2 s (ref 11.6–15.2)

## 2015-04-05 LAB — I-STAT CG4 LACTIC ACID, ED: Lactic Acid, Venous: 1.61 mmol/L (ref 0.5–2.0)

## 2015-04-05 LAB — CBG MONITORING, ED: GLUCOSE-CAPILLARY: 150 mg/dL — AB (ref 65–99)

## 2015-04-05 MED ORDER — ACETAMINOPHEN 325 MG PO TABS: 650.0000 mg | ORAL_TABLET | Freq: Four times a day (QID) | ORAL | Status: DC | PRN

## 2015-04-05 MED ORDER — ENOXAPARIN SODIUM 40 MG/0.4ML ~~LOC~~ SOLN
40.0000 mg | SUBCUTANEOUS | Status: DC
  Administered 2015-04-05 – 2015-04-06 (×2): 40 mg via SUBCUTANEOUS
  Filled 2015-04-05 (×2): qty 0.4

## 2015-04-05 MED ORDER — PRAVASTATIN SODIUM 20 MG PO TABS
20.0000 mg | ORAL_TABLET | Freq: Every day | ORAL | Status: DC
  Administered 2015-04-05 – 2015-04-07 (×3): 20 mg via ORAL
  Filled 2015-04-05 (×3): qty 1

## 2015-04-05 MED ORDER — SODIUM CHLORIDE 0.9 % IV BOLUS (SEPSIS)
1000.0000 mL | Freq: Once | INTRAVENOUS | Status: AC
  Administered 2015-04-05: 1000 mL via INTRAVENOUS

## 2015-04-05 MED ORDER — OMEGA-3-ACID ETHYL ESTERS 1 G PO CAPS
1.0000 g | ORAL_CAPSULE | Freq: Every day | ORAL | Status: DC
  Administered 2015-04-05 – 2015-04-07 (×3): 1 g via ORAL
  Filled 2015-04-05 (×3): qty 1

## 2015-04-05 MED ORDER — ONDANSETRON HCL 4 MG PO TABS: 4.0000 mg | ORAL_TABLET | Freq: Four times a day (QID) | ORAL | Status: DC | PRN

## 2015-04-05 MED ORDER — ONDANSETRON HCL 4 MG/2ML IJ SOLN
4.0000 mg | Freq: Four times a day (QID) | INTRAMUSCULAR | Status: DC | PRN
Start: 2015-04-05 — End: 2015-04-07

## 2015-04-05 MED ORDER — SODIUM CHLORIDE 0.9% FLUSH
3.0000 mL | Freq: Two times a day (BID) | INTRAVENOUS | Status: DC
  Administered 2015-04-05 – 2015-04-07 (×3): 3 mL via INTRAVENOUS

## 2015-04-05 MED ORDER — INSULIN ASPART 100 UNIT/ML ~~LOC~~ SOLN
0.0000 [IU] | Freq: Three times a day (TID) | SUBCUTANEOUS | Status: DC
  Administered 2015-04-06: 2 [IU] via SUBCUTANEOUS

## 2015-04-05 MED ORDER — PANTOPRAZOLE SODIUM 40 MG PO TBEC
40.0000 mg | DELAYED_RELEASE_TABLET | Freq: Every day | ORAL | Status: DC
  Administered 2015-04-05 – 2015-04-07 (×3): 40 mg via ORAL
  Filled 2015-04-05 (×3): qty 1

## 2015-04-05 MED ORDER — ACETAMINOPHEN 650 MG RE SUPP: 650.0000 mg | Freq: Four times a day (QID) | RECTAL | Status: DC | PRN

## 2015-04-05 MED ORDER — CLOPIDOGREL BISULFATE 75 MG PO TABS
75.0000 mg | ORAL_TABLET | Freq: Every day | ORAL | Status: DC
  Administered 2015-04-06 – 2015-04-07 (×2): 75 mg via ORAL
  Filled 2015-04-05 (×2): qty 1

## 2015-04-05 MED ORDER — DIPHENHYDRAMINE HCL 12.5 MG/5ML PO ELIX
12.5000 mg | ORAL_SOLUTION | Freq: Once | ORAL | Status: AC | PRN
  Administered 2015-04-05: 12.5 mg via ORAL
  Filled 2015-04-05: qty 10

## 2015-04-05 MED ORDER — SODIUM CHLORIDE 0.9 % IV SOLN
INTRAVENOUS | Status: AC
  Administered 2015-04-05: 19:00:00 via INTRAVENOUS
  Filled 2015-04-05: qty 1000

## 2015-04-05 MED ORDER — DEXTROSE 5 % IV SOLN
1.0000 g | Freq: Once | INTRAVENOUS | Status: AC
  Administered 2015-04-05: 1 g via INTRAVENOUS
  Filled 2015-04-05: qty 10

## 2015-04-05 MED ORDER — DEXTROSE 5 % IV SOLN
2.0000 g | Freq: Once | INTRAVENOUS | Status: DC
  Filled 2015-04-05: qty 2

## 2015-04-05 NOTE — ED Provider Notes (Signed)
CSN: [id number]     Arrival date & time 04/05/15  0945 History   First MD Initiated Contact with Patient 04/05/15 1003     Chief Complaint  Patient presents with  . Fall     (Consider location/radiation/quality/duration/timing/severity/associated sxs/prior Treatment) HPI   Level V caveat applies due to dementia.  Nicole Villa is a 82 y.o. female, with a history of dementia, Alzheimer's, DM, anemia, and hypertension, presenting to the ED with a fall that occurred just PTA. Pt was found prone on the bedroom floor. Pt lives at home with her husband. Since yesterday pt was noted to be Patient's only complaint at this time is left upper leg pain, which she can't rate with a number or give a description. Patient's daughters, Nicole Villa and Nicole Villa, are at the bedside and provide part of the patient's information and history. Nicole Villa states that the patient was found on the floor and did not remember falling. The daughters state that the patient has no new confusion, but for the last 24 hours has been reaching out for invisible objects, seeing things that aren't there, and talking to friends and family members who have been dead for a while. Patient denies nausea/vomiting, fever/chills, chest pain, shortness of breath, difficulty or pain with urination, neck or back pain, or any other complaints.   Past Medical History  Diagnosis Date  . Gallstones   . Diabetes mellitus without complication (HCC)   . Hypertension   . Arthritis   . Heart murmur   . GERD (gastroesophageal reflux disease)   . Hyperlipemia   . Microcytic anemia 05/07/2012   History reviewed. No pertinent past surgical history. History reviewed. No pertinent family history. Social History  Substance Use Topics  . Smoking status: Never Smoker   . Smokeless tobacco: Never Used  . Alcohol Use: No   OB History    No data available     Review of Systems  Unable to perform ROS: Dementia  Constitutional:       Fall   Musculoskeletal:       Left upper leg pain  Psychiatric/Behavioral: Positive for hallucinations.      Allergies  Lisinopril  Home Medications   Prior to Admission medications   Medication Sig Start Date End Date Taking? Authorizing Provider  acetaminophen (TYLENOL) 325 MG tablet Take 650 mg by mouth at bedtime.   Yes Historical Provider, MD  amLODipine (NORVASC) 10 MG tablet Take 10 mg by mouth daily.   Yes Historical Provider, MD  beta carotene w/minerals (OCUVITE) tablet Take 1 tablet by mouth daily.   Yes Historical Provider, MD  cholecalciferol (VITAMIN D) 1000 UNITS tablet Take 1,000 Units by mouth daily.   Yes Historical Provider, MD  clopidogrel (PLAVIX) 75 MG tablet Take 1 tablet (75 mg total) by mouth daily with breakfast. 05/09/12  Yes Osvaldo Shipper, MD  fish oil-omega-3 fatty acids 1000 MG capsule Take 1 g by mouth daily.   Yes Historical Provider, MD  glimepiride (AMARYL) 1 MG tablet Take 0.5 mg by mouth See admin instructions. Take a 1/2 tablet with the first meal of the day orally twice daily   Yes Historical Provider, MD  HYDROcodone-acetaminophen (NORCO/VICODIN) 5-325 MG tablet Take 0.5 tablets by mouth 2 (two) times daily.   Yes Historical Provider, MD  irbesartan (AVAPRO) 300 MG tablet Take 1 tablet (300 mg total) by mouth daily. 06/03/13  Yes Marianne L York, PA-C  nabumetone (RELAFEN) 500 MG tablet Take 500 mg by mouth daily.  Yes Historical Provider, MD  pantoprazole (PROTONIX) 40 MG tablet Take 40 mg by mouth daily.   Yes Historical Provider, MD  pravastatin (PRAVACHOL) 20 MG tablet Take 20 mg by mouth daily.   Yes Historical Provider, MD   BP 155/80 mmHg  Pulse 107  Temp(Src) 95.8 F (35.4 C) (Rectal)  Resp 19  SpO2 97% Physical Exam  Constitutional: She appears well-developed and well-nourished. No distress.  HENT:  Head: Normocephalic and atraumatic.  Mouth/Throat: Oropharynx is clear and moist.  No facial bruising, swelling, instability, or crepitus.   Eyes: Conjunctivae and EOM are normal.  Left pupil reactive to light and normal in appearance. Right pupil is not visible due to a childhood injury. 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  Neck: Normal range of motion. Neck supple.  Cardiovascular: Normal rate, regular rhythm, normal heart sounds and intact distal pulses.   Pulmonary/Chest: Effort normal and breath sounds normal. No respiratory distress.  Abdominal: Soft. Bowel sounds are normal. There is no tenderness. There is no guarding.  Musculoskeletal: She exhibits no edema or tenderness.  Full ROM in all extremities and spine. No paraspinal tenderness. Large contusion and tenderness to left lateral chest. Large bruise and tenderness to anterior left thigh. Bruising and tenderness to left upper chest and left lateral chest. No instability or crepitus noted to either location. Overall trauma exam reveals no other abnormalities.  Lymphadenopathy:    She has no cervical adenopathy.  Neurological: She is alert. She has normal reflexes.  No sensory deficits. Strength 5/5 in all extremities. Gait testing deferred. Coordination intact. Cranial nerves III-XII grossly intact. No facial droop. Patient is able to answer who she is and who the president is, correctly.   Skin: Skin is warm and dry. She is not diaphoretic.  Nursing note and vitals reviewed.   ED Course  Procedures (including critical care time) Labs Review Labs Reviewed  CBC WITH DIFFERENTIAL/PLATELET - Abnormal; Notable for the following:    WBC 10.9 (*)    Neutro Abs 8.8 (*)    Monocytes Absolute 1.1 (*)    All other components within normal limits  COMPREHENSIVE METABOLIC PANEL - Abnormal; Notable for the following:    Glucose, Bld 170 (*)    BUN 35 (*)    Creatinine, Ser 1.37 (*)    Albumin 3.4 (*)    GFR calc non Af Amer 33 (*)    GFR calc Af Amer 38 (*)    All other components within normal limits  URINALYSIS, ROUTINE W REFLEX  MICROSCOPIC (NOT AT [facility]) - Abnormal; Notable for the following:    Glucose, UA 100 (*)    Hgb urine dipstick MODERATE (*)    Protein, ur 30 (*)    Leukocytes, UA MODERATE (*)    All other components within normal limits  URINE MICROSCOPIC-ADD ON - Abnormal; Notable for the following:    Squamous Epithelial / LPF 0-5 (*)    Bacteria, UA FEW (*)    All other components within normal limits  CBG MONITORING, ED - Abnormal; Notable for the following:    Glucose-Capillary 150 (*)    All other components within normal limits  URINE CULTURE  CULTURE, BLOOD (ROUTINE X 2)  CULTURE, BLOOD (ROUTINE X 2)  PROTIME-INR  I-STAT CG4 LACTIC ACID, ED  I-STAT TROPOININ, ED    Imaging Review Dg Ribs Unilateral W/chest Left  04/05/2015  CLINICAL DATA:  Found down, confused EXAM: LEFT RIBS AND CHEST -  3+ VIEW COMPARISON:  None. FINDINGS: Single view of the chest and two views of the left ribs are provided. There is cardiomegaly, moderate in degree. Lungs are clear. No pleural effusion seen. No pneumothorax seen. Degenerative changes noted throughout the thoracic spine. No acute osseous abnormality. No left-sided rib fracture identified. IMPRESSION: Cardiomegaly. Lungs are clear. No left-sided rib fracture or displacement seen. Electronically Signed   By: Bary Richard M.D.   On: 04/05/2015 11:13   Ct Head Wo Contrast  04/05/2015  CLINICAL DATA:  Pain following fall.  Dementia. EXAM: CT HEAD WITHOUT CONTRAST CT CERVICAL SPINE WITHOUT CONTRAST TECHNIQUE: Multidetector CT imaging of the head and cervical spine was performed following the standard protocol without intravenous contrast. Multiplanar CT image reconstructions of the cervical spine were also generated. COMPARISON:  Head CT May 07, 2012; brain MRI May 08, 2012 FINDINGS: CT HEAD FINDINGS Moderate diffuse atrophy is stable. There is no intracranial mass, hemorrhage, extra-axial fluid collection, or midline shift. There is patchy small vessel disease in  the centra semiovale bilaterally. Elsewhere gray-white compartments appear normal. No acute infarct evident. Bony calvarium appears intact. The mastoid air cells are clear. No intraorbital lesions are apparent. There are small air-fluid levels in each maxillary antrum. There is also a smaller air-fluid level in the left sphenoid sinus region. There is opacification of anterior ethmoid air cells bilaterally. CT CERVICAL SPINE FINDINGS There is no fracture or spondylolisthesis. The prevertebral soft tissues and predental space regions are normal. There is moderately severe disc space narrowing at C6-7. There is mild narrowing at C7-T1, C3-4, and C4-5. There is facet osteoarthritic change at most levels bilaterally. At C5-6 on the right, there is a calcified disc protrusion which is effacing the exiting nerve root on the right and causing mild impression on the thecal sac ventrally. There is a moderate calcified central and left paracentral disc protrusion at C4-5. There is exit foraminal narrowing at multiple levels due to bony hypertrophy. There is calcification in each carotid artery. There is extensive cystic change in the region of the odontoid without cortical disruption. IMPRESSION: CT head: Atrophy with periventricular small vessel disease, stable. Sinusitis at several levels with several air-fluid levels, likely due to acute sinusitis. No intracranial mass, hemorrhage, or acute appearing infarct. No extra-axial fluid collections. CT cervical spine: Multilevel arthropathy. Mild spinal stenosis due to calcified disc protrusion at C5-6 on the right paracentrally. There is marked exit foraminal narrowing on the right at C5-6. No acute fracture or spondylolisthesis. Extensive cystic changes noted in the odontoid region. There is calcification in each carotid artery. Electronically Signed   By: Bretta Bang III M.D.   On: 04/05/2015 11:28   Ct Cervical Spine Wo Contrast  04/05/2015  CLINICAL DATA:  Pain  following fall.  Dementia. EXAM: CT HEAD WITHOUT CONTRAST CT CERVICAL SPINE WITHOUT CONTRAST TECHNIQUE: Multidetector CT imaging of the head and cervical spine was performed following the standard protocol without intravenous contrast. Multiplanar CT image reconstructions of the cervical spine were also generated. COMPARISON:  Head CT May 07, 2012; brain MRI May 08, 2012 FINDINGS: CT HEAD FINDINGS Moderate diffuse atrophy is stable. There is no intracranial mass, hemorrhage, extra-axial fluid collection, or midline shift. There is patchy small vessel disease in the centra semiovale bilaterally. Elsewhere gray-white compartments appear normal. No acute infarct evident. Bony calvarium appears intact. The mastoid air cells are clear. No intraorbital lesions are apparent. There are small air-fluid levels in each maxillary antrum. There is also a smaller air-fluid  level in the left sphenoid sinus region. There is opacification of anterior ethmoid air cells bilaterally. CT CERVICAL SPINE FINDINGS There is no fracture or spondylolisthesis. The prevertebral soft tissues and predental space regions are normal. There is moderately severe disc space narrowing at C6-7. There is mild narrowing at C7-T1, C3-4, and C4-5. There is facet osteoarthritic change at most levels bilaterally. At C5-6 on the right, there is a calcified disc protrusion which is effacing the exiting nerve root on the right and causing mild impression on the thecal sac ventrally. There is a moderate calcified central and left paracentral disc protrusion at C4-5. There is exit foraminal narrowing at multiple levels due to bony hypertrophy. There is calcification in each carotid artery. There is extensive cystic change in the region of the odontoid without cortical disruption. IMPRESSION: CT head: Atrophy with periventricular small vessel disease, stable. Sinusitis at several levels with several air-fluid levels, likely due to acute sinusitis. No  intracranial mass, hemorrhage, or acute appearing infarct. No extra-axial fluid collections. CT cervical spine: Multilevel arthropathy. Mild spinal stenosis due to calcified disc protrusion at C5-6 on the right paracentrally. There is marked exit foraminal narrowing on the right at C5-6. No acute fracture or spondylolisthesis. Extensive cystic changes noted in the odontoid region. There is calcification in each carotid artery. Electronically Signed   By: Bretta Bang III M.D.   On: 04/05/2015 11:28   Dg Hip Unilat With Pelvis 2-3 Views Right  04/05/2015  CLINICAL DATA:  Pain after fall. EXAM: DG HIP (WITH OR WITHOUT PELVIS) 2-3V RIGHT COMPARISON:  None. FINDINGS: There are degenerative changes in the hips. No fractures are identified on today's study. A calcification in the pelvis is likely a fibroid. IMPRESSION: No acute abnormality. Electronically Signed   By: Gerome Sam III M.D   On: 04/05/2015 11:13   Dg Femur Min 2 Views Left  04/05/2015  CLINICAL DATA:  Found on floor, confused, frequent falls recently. EXAM: LEFT FEMUR 2 VIEWS COMPARISON:  None. FINDINGS: AP and lateral views of the left femur are provided. No osseous fracture or dislocation seen. Left femoral head appears well positioned relative to the acetabulum. Prominent atherosclerotic calcifications noted within the soft tissues of the left thigh. Surrounding soft tissues are otherwise unremarkable. Degenerative changes noted at the left knee joint with associated chondrocalcinosis indicating underlying CPPD. IMPRESSION: No acute findings.  No osseous fracture or dislocation. Electronically Signed   By: Bary Richard M.D.   On: 04/05/2015 11:11   I have personally reviewed and evaluated these images and lab results as part of my medical decision-making.   EKG Interpretation None      MDM   Final diagnoses:  Fall  AKI (acute kidney injury) (HCC)  Hallucinations  Delirium    YANCI BACHTELL presents with a fall that  occurred today and now complaining of left upper leg pain. Family complains of new hallucinations since yesterday.  Findings and plan of care discussed with Raeford Razor, MD.  It appears that the patient's only current abnormality as the auditory and visual hallucinations. Patient is noted to have an AKI, possibly due to dehydration. IV fluids were ordered. 11:33 AM Spoke with patient's PCP, [name], MD, who advised that the patient is well cared for by her daughters and the daughters usually have pretty good judgement for how the patient is doing. Dr. Clelia Croft states that if Nicole Villa and Nicole Villa think something is off, then something usually is. Further advises that the patient does have  advanced dementia and confusion, but the hallucinations are new for her. Can see pt on Wednesday, Feb 8, for close follow up, if she is not admitted. Spoke with the patient's daughters and filled them in on this conversation. They state that if no abnormalities are found with the rest of the patient's labs, they're comfortable with the patient being sent home. They agreed to watch the patient closely and follow-up with Dr. Clelia Croft on Wednesday. The issue of placement for the patient was brought up. The daughters state that they are adamantly opposed to taking the patient out of the home. They said that they are willing to either move the patient in with one of them or to have someone stay with her full-time. An order for home health was placed. 2:15 PM Patient's delirium seems to be worsening. Pt is attempting to pull out her IV and pull her EKG leads off. Pt is still tachycardic despite 2 L of NS. Pt definitely meets SIRS/Sepsis criteria due to her suspect source of infection (UTI), her tachycardia, and her hypothermia, but does not have signs of septic shock.  2:34 PM Spoke with hospitalist, Catarina Hartshorn, MD, who stated that he would come see the patient.  Dr. Arbutus Leas admitted the patient himself. No further instructions for [place].    Filed Vitals:   04/05/15 1230 04/05/15 1254 04/05/15 1330 04/05/15 1430  BP: 137/68  119/89 155/80  Pulse: 104  110 107  Temp:  95.8 F (35.4 C)    TempSrc:  Rectal    Resp: 19  26 19   SpO2: 95%  100% 97%     Anselm Pancoast, PA-C 04/05/15 1518  Raeford Razor, MD 04/06/15 [phone]

## 2015-04-05 NOTE — ED Notes (Signed)
Per EMS - pt from home. Pt husband found pt prone on floor. Pt reports sliding out of bed onto floor. Pt confused, hallucinating. Hx alzheimers and dementia, but not normally like this per family. Frequent falls recently - no injury. Bruise on left arm. Some swelling to left eye per pt daughter. Hx diabetes. Has not eaten and no meds yet today. Pt takes plavix for TIAs - no TIA in 6 months. Negative stroke scale.  164/90. CBG 194. Hr 100. 98% RA. RR 16

## 2015-04-05 NOTE — Discharge Planning (Signed)
ERCM provided Pt daughter private pay agency list as requested.

## 2015-04-05 NOTE — ED Notes (Signed)
Placed pt on bed pan.

## 2015-04-05 NOTE — ED Notes (Signed)
Pt transported to Xray/CT 

## 2015-04-05 NOTE — H&P (Signed)
History and Physical  Nicole Villa ZOX:[id number] DOB: 02/08/1923 DOA: 04/05/2015   PCP: [name], MD  Referring Physician: ED/ Dr. Raeford Razor  Chief Complaint: fall, confusion  HPI:  82 year old female with a history of diabetes mellitus, hypertension, GERD, hyperlipidemia presented with one-week history of increasing confusion. The patient is unable to provide any history at this time secondary to her mental status change. On the day of admission, the patient sustained a mechanical fall. She was not able to get up off the floor. EMS was activated by the patient's husband. Apparently, the patient has been having visual hallucinations and seeing things on the floor. She was trying to pick up something on the floor and fell. Her daughter notes that the patient has been increasingly confused over the past week in the setting of underlying dementia. Daughter also states that the patient has had 3 falls in the past 4 days. The patient is supposed to be using a walker, but frequently does not use it. There has not been any reports of fevers, headaches, chest pain, sinus breath, vomiting, diarrhea, vomiting, dysuria. After admission, CT of the brain was negative for acute intracranial process but did reveal some sinusitis with air-fluid levels. Etiology of the cervical spine was negative for any fractures. X-ray of the hips were negative for fractures. Rib series was negative. Lactic acid was 1.61 with a BBC 10.9. Serum creatinine was 1.37. She was noted to be hypothermic with a temperature 95.8 and tachycardic up to 110. Assessment/Plan: Sepsis -Present time of admission -The patient was hypothermic with tachycardia -Secondary to urinary source -Patient received 30cc/kg in ED -Lactic acid 1.61 -Procalcitonin -Continue empiric antibiotics pending culture data Pyuria -Suspected UTI -Continue ceftriaxone pending culture data Acute encephalopathy -Secondary to dehydration, acute  kidney injury, and UTI -Check TSH -Serum B12 -am cortisol -EKG AKI -Likely volume depletion in the setting of diabetes and Relafen use -Continue IV fluids Hypertension -Hold ARB in setting of AKI -Continue amlodipine Diabetes mellitus type 2 -Discontinue Amaryl for now -Hemoglobin A1c -NovoLog sliding scale Hyperlipidemia -Continue statin       Past Medical History  Diagnosis Date  . Gallstones   . Diabetes mellitus without complication (HCC)   . Hypertension   . Arthritis   . Heart murmur   . GERD (gastroesophageal reflux disease)   . Hyperlipemia   . Microcytic anemia 05/07/2012   History reviewed. No pertinent past surgical history. Social History:  reports that she has never smoked. She has never used smokeless tobacco. She reports that she does not drink alcohol or use illicit drugs.   History reviewed. No pertinent family history.   Allergies  Allergen Reactions  . Lisinopril Cough      Prior to Admission medications   Medication Sig Start Date End Date Taking? Authorizing Provider  acetaminophen (TYLENOL) 325 MG tablet Take 650 mg by mouth at bedtime.   Yes Historical Provider, MD  amLODipine (NORVASC) 10 MG tablet Take 10 mg by mouth daily.   Yes Historical Provider, MD  beta carotene w/minerals (OCUVITE) tablet Take 1 tablet by mouth daily.   Yes Historical Provider, MD  cholecalciferol (VITAMIN D) 1000 UNITS tablet Take 1,000 Units by mouth daily.   Yes Historical Provider, MD  clopidogrel (PLAVIX) 75 MG tablet Take 1 tablet (75 mg total) by mouth daily with breakfast. 05/09/12  Yes Osvaldo Shipper, MD  fish oil-omega-3 fatty acids 1000 MG capsule Take 1 g by mouth daily.   Yes Historical  Provider, MD  glimepiride (AMARYL) 1 MG tablet Take 0.5 mg by mouth See admin instructions. Take a 1/2 tablet with the first meal of the day orally twice daily   Yes Historical Provider, MD  HYDROcodone-acetaminophen (NORCO/VICODIN) 5-325 MG tablet Take 0.5 tablets by  mouth 2 (two) times daily.   Yes Historical Provider, MD  irbesartan (AVAPRO) 300 MG tablet Take 1 tablet (300 mg total) by mouth daily. 06/03/13  Yes Marianne L York, PA-C  nabumetone (RELAFEN) 500 MG tablet Take 500 mg by mouth daily.   Yes Historical Provider, MD  pantoprazole (PROTONIX) 40 MG tablet Take 40 mg by mouth daily.   Yes Historical Provider, MD  pravastatin (PRAVACHOL) 20 MG tablet Take 20 mg by mouth daily.   Yes Historical Provider, MD    Review of Systems:  Constitutional:  No weight loss, night sweats, Fevers, chills, fatigue.  Head&Eyes: No headache.  No vision loss.  No eye pain or scotoma ENT:  No Difficulty swallowing,Tooth/dental problems,Sore throat,  No ear ache, post nasal drip,  Cardio-vascular:  No chest pain, Orthopnea, PND, swelling in lower extremities,  dizziness, palpitations  GI:  No  abdominal pain, nausea, vomiting, diarrhea, loss of appetite, hematochezia, melena, heartburn, indigestion, Resp:  No shortness of breath with exertion or at rest. No cough. No coughing up of blood .No wheezing.No chest wall deformity  Skin:  no rash or lesions.  GU:  no dysuria, change in color of urine, no urgency or frequency. No flank pain.  Musculoskeletal:  No joint pain or swelling. No decreased range of motion. No back pain.  Psych:  No change in mood or affect. No depression or anxiety. Neurologic: No headache, no dysesthesia, no focal weakness, no vision loss. No syncope  Physical Exam: Filed Vitals:   04/05/15 1230 04/05/15 1254 04/05/15 1330 04/05/15 1430  BP: 137/68  119/89 155/80  Pulse: 104  110 107  Temp:  95.8 F (35.4 C)    TempSrc:  Rectal    Resp: 19  26 19   SpO2: 95%  100% 97%   General:  A&O x 3, NAD, nontoxic, pleasant/cooperative Head/Eye: No conjunctival hemorrhage, no icterus, [place]/AT, No nystagmus ENT:  No icterus,  No thrush, good dentition, no pharyngeal exudate Neck:  No masses, no lymphadenpathy, no bruits CV:  RRR, no rub, no  gallop, no S3 Lung:  CTAB, good air movement, no wheeze, no rhonchi Abdomen: soft/NT, +BS, nondistended, no peritoneal signs Ext: No cyanosis, No rashes, No petechiae, No lymphangitis, No edema Neuro: CNII-XII intact, strength 4/5 in bilateral upper and lower extremities, no dysmetria  Labs on Admission:  Basic Metabolic Panel:  Recent Labs Lab 04/05/15 1034  NA 141  K 4.4  CL 104  CO2 23  GLUCOSE 170*  BUN 35*  CREATININE 1.37*  CALCIUM 9.7   Liver Function Tests:  Recent Labs Lab 04/05/15 1034  AST 39  ALT 21  ALKPHOS 80  BILITOT 0.7  PROT 6.9  ALBUMIN 3.4*   No results for input(s): LIPASE, AMYLASE in the last 168 hours. No results for input(s): AMMONIA in the last 168 hours. CBC:  Recent Labs Lab 04/05/15 1034  WBC 10.9*  NEUTROABS 8.8*  HGB 13.2  HCT 39.8  MCV 85.0  PLT 300   Cardiac Enzymes: No results for input(s): CKTOTAL, CKMB, CKMBINDEX, TROPONINI in the last 168 hours. BNP: Invalid input(s): POCBNP CBG:  Recent Labs Lab 04/05/15 1034  GLUCAP 150*    Radiological Exams on Admission: Dg Ribs Unilateral  W/chest Left  04/05/2015  CLINICAL DATA:  Found down, confused EXAM: LEFT RIBS AND CHEST - 3+ VIEW COMPARISON:  None. FINDINGS: Single view of the chest and two views of the left ribs are provided. There is cardiomegaly, moderate in degree. Lungs are clear. No pleural effusion seen. No pneumothorax seen. Degenerative changes noted throughout the thoracic spine. No acute osseous abnormality. No left-sided rib fracture identified. IMPRESSION: Cardiomegaly. Lungs are clear. No left-sided rib fracture or displacement seen. Electronically Signed   By: Bary Richard M.D.   On: 04/05/2015 11:13   Ct Head Wo Contrast  04/05/2015  CLINICAL DATA:  Pain following fall.  Dementia. EXAM: CT HEAD WITHOUT CONTRAST CT CERVICAL SPINE WITHOUT CONTRAST TECHNIQUE: Multidetector CT imaging of the head and cervical spine was performed following the standard protocol  without intravenous contrast. Multiplanar CT image reconstructions of the cervical spine were also generated. COMPARISON:  Head CT May 07, 2012; brain MRI May 08, 2012 FINDINGS: CT HEAD FINDINGS Moderate diffuse atrophy is stable. There is no intracranial mass, hemorrhage, extra-axial fluid collection, or midline shift. There is patchy small vessel disease in the centra semiovale bilaterally. Elsewhere gray-white compartments appear normal. No acute infarct evident. Bony calvarium appears intact. The mastoid air cells are clear. No intraorbital lesions are apparent. There are small air-fluid levels in each maxillary antrum. There is also a smaller air-fluid level in the left sphenoid sinus region. There is opacification of anterior ethmoid air cells bilaterally. CT CERVICAL SPINE FINDINGS There is no fracture or spondylolisthesis. The prevertebral soft tissues and predental space regions are normal. There is moderately severe disc space narrowing at C6-7. There is mild narrowing at C7-T1, C3-4, and C4-5. There is facet osteoarthritic change at most levels bilaterally. At C5-6 on the right, there is a calcified disc protrusion which is effacing the exiting nerve root on the right and causing mild impression on the thecal sac ventrally. There is a moderate calcified central and left paracentral disc protrusion at C4-5. There is exit foraminal narrowing at multiple levels due to bony hypertrophy. There is calcification in each carotid artery. There is extensive cystic change in the region of the odontoid without cortical disruption. IMPRESSION: CT head: Atrophy with periventricular small vessel disease, stable. Sinusitis at several levels with several air-fluid levels, likely due to acute sinusitis. No intracranial mass, hemorrhage, or acute appearing infarct. No extra-axial fluid collections. CT cervical spine: Multilevel arthropathy. Mild spinal stenosis due to calcified disc protrusion at C5-6 on the right  paracentrally. There is marked exit foraminal narrowing on the right at C5-6. No acute fracture or spondylolisthesis. Extensive cystic changes noted in the odontoid region. There is calcification in each carotid artery. Electronically Signed   By: Bretta Bang III M.D.   On: 04/05/2015 11:28   Ct Cervical Spine Wo Contrast  04/05/2015  CLINICAL DATA:  Pain following fall.  Dementia. EXAM: CT HEAD WITHOUT CONTRAST CT CERVICAL SPINE WITHOUT CONTRAST TECHNIQUE: Multidetector CT imaging of the head and cervical spine was performed following the standard protocol without intravenous contrast. Multiplanar CT image reconstructions of the cervical spine were also generated. COMPARISON:  Head CT May 07, 2012; brain MRI May 08, 2012 FINDINGS: CT HEAD FINDINGS Moderate diffuse atrophy is stable. There is no intracranial mass, hemorrhage, extra-axial fluid collection, or midline shift. There is patchy small vessel disease in the centra semiovale bilaterally. Elsewhere gray-white compartments appear normal. No acute infarct evident. Bony calvarium appears intact. The mastoid air cells are clear. No intraorbital lesions  are apparent. There are small air-fluid levels in each maxillary antrum. There is also a smaller air-fluid level in the left sphenoid sinus region. There is opacification of anterior ethmoid air cells bilaterally. CT CERVICAL SPINE FINDINGS There is no fracture or spondylolisthesis. The prevertebral soft tissues and predental space regions are normal. There is moderately severe disc space narrowing at C6-7. There is mild narrowing at C7-T1, C3-4, and C4-5. There is facet osteoarthritic change at most levels bilaterally. At C5-6 on the right, there is a calcified disc protrusion which is effacing the exiting nerve root on the right and causing mild impression on the thecal sac ventrally. There is a moderate calcified central and left paracentral disc protrusion at C4-5. There is exit foraminal narrowing  at multiple levels due to bony hypertrophy. There is calcification in each carotid artery. There is extensive cystic change in the region of the odontoid without cortical disruption. IMPRESSION: CT head: Atrophy with periventricular small vessel disease, stable. Sinusitis at several levels with several air-fluid levels, likely due to acute sinusitis. No intracranial mass, hemorrhage, or acute appearing infarct. No extra-axial fluid collections. CT cervical spine: Multilevel arthropathy. Mild spinal stenosis due to calcified disc protrusion at C5-6 on the right paracentrally. There is marked exit foraminal narrowing on the right at C5-6. No acute fracture or spondylolisthesis. Extensive cystic changes noted in the odontoid region. There is calcification in each carotid artery. Electronically Signed   By: Bretta Bang III M.D.   On: 04/05/2015 11:28   Dg Hip Unilat With Pelvis 2-3 Views Right  04/05/2015  CLINICAL DATA:  Pain after fall. EXAM: DG HIP (WITH OR WITHOUT PELVIS) 2-3V RIGHT COMPARISON:  None. FINDINGS: There are degenerative changes in the hips. No fractures are identified on today's study. A calcification in the pelvis is likely a fibroid. IMPRESSION: No acute abnormality. Electronically Signed   By: Gerome Sam III M.D   On: 04/05/2015 11:13   Dg Femur Min 2 Views Left  04/05/2015  CLINICAL DATA:  Found on floor, confused, frequent falls recently. EXAM: LEFT FEMUR 2 VIEWS COMPARISON:  None. FINDINGS: AP and lateral views of the left femur are provided. No osseous fracture or dislocation seen. Left femoral head appears well positioned relative to the acetabulum. Prominent atherosclerotic calcifications noted within the soft tissues of the left thigh. Surrounding soft tissues are otherwise unremarkable. Degenerative changes noted at the left knee joint with associated chondrocalcinosis indicating underlying CPPD. IMPRESSION: No acute findings.  No osseous fracture or dislocation.  Electronically Signed   By: Bary Richard M.D.   On: 04/05/2015 11:11    EKG: Independently reviewed. pending    Time spent:60 minutes Code Status:   FULL Family Communication:   Daughters updated at bedside   Jerolyn Flenniken, DO  [facility] Pager [phone]  If 7PM-7AM, please contact night-coverage [email or web address] Password [email or web address] 04/05/2015, 2:59 PM

## 2015-04-05 NOTE — ED Provider Notes (Addendum)
Medical screening examination/treatment/procedure(s) were conducted as a shared visit with non-physician practitioner(s) and myself.  I personally evaluated the patient during the encounter.   EKG Interpretation None     89yF exhibiting signs of delirium. Hallucinations. Picking at things. Afebrile. HD stable. Suspect 2/2 UTI. Will send culture. Abx. Also elevation in Cr at 1.37 with BUN in 30s. IVF. Discussed with daughters at bedside concerning admission versus outpt tx. They both seem very reliable. They are comfortable with taking pt home at this time and I feel this is reasonable.. They expressed some interest in more information about additional assistance at home. Will see if case management can speak with them. Will give dose of rocephin prior to DC. Return precautions discussed.   Raeford Razor, MD 04/05/15 1249  Rectal temp checked and low. Still remains tachycardic despite IVF. Meets criteria for sepsis. With her age may be more prudent to admit for abx and monitoring.   Raeford Razor, MD 04/05/15 [phone]

## 2015-04-05 NOTE — Care Management Note (Signed)
Case Management Note  Patient Details  Name: Nicole Villa MRN: [id number] Date of Birth: [birth year]  Subjective/Objective:                  82 y.o. female, with a history of dementia, Alzheimer's, DM, anemia, and hypertension, presenting to the ED with a fall that occurred just PTA. Pt was found prone on the bedroom floor. //Pt lives at home with her husband.   Action/Plan: Follow for disposition needs.   Expected Discharge Date:    04/05/15              Expected Discharge Plan:  Home w Home Health Services  In-House Referral:  NA  Discharge planning Services  CM Consult  Post Acute Care Choice:  Home Health Choice offered to:  Adult Children  DME Arranged:  N/A DME Agency:  NA  HH Arranged:  RN, Nurse's Aide HH Agency:  NA  Status of Service:  Completed, signed off  Medicare Important Message Given:    Date Medicare IM Given:    Medicare IM give by:    Date Additional Medicare IM Given:    Additional Medicare Important Message give by:     If discussed at Long Length of Stay Meetings, dates discussed:    Additional Comments: Tanesia Butner J. Lucretia Roers, RN, BSN, [employer] [phone] Spoke with pt and daughter at bedside regarding discharge planning for [facility] Offered pt list of home health agencies to choose from.  Pt daughter chose [facility] to render services. [facility] of [facility] notified.  No DME needs identified at this time.  Oletta Cohn, RN 04/05/2015, 1:48 PM

## 2015-04-06 LAB — GLUCOSE, CAPILLARY
Glucose-Capillary: 113 mg/dL — ABNORMAL HIGH (ref 65–99)
Glucose-Capillary: 155 mg/dL — ABNORMAL HIGH (ref 65–99)
Glucose-Capillary: 102 mg/dL — ABNORMAL HIGH (ref 65–99)
Glucose-Capillary: 124 mg/dL — ABNORMAL HIGH (ref 65–99)

## 2015-04-06 LAB — CBC
HCT: 35.9 % — ABNORMAL LOW (ref 36.0–46.0)
Hemoglobin: 11.6 g/dL — ABNORMAL LOW (ref 12.0–15.0)
MCH: 27.4 pg (ref 26.0–34.0)
MCHC: 32.3 g/dL (ref 30.0–36.0)
MCV: 84.9 fL (ref 78.0–100.0)
PLATELETS: 291 10*3/uL (ref 150–400)
RBC: 4.23 MIL/uL (ref 3.87–5.11)
RDW: 15.6 % — AB (ref 11.5–15.5)
WBC: 8.2 10*3/uL (ref 4.0–10.5)

## 2015-04-06 LAB — BASIC METABOLIC PANEL
ANION GAP: 11 (ref 5–15)
BUN: 17 mg/dL (ref 6–20)
CALCIUM: 8.7 mg/dL — AB (ref 8.9–10.3)
CO2: 22 mmol/L (ref 22–32)
CREATININE: 0.81 mg/dL (ref 0.44–1.00)
Chloride: 107 mmol/L (ref 101–111)
GFR calc Af Amer: >60 mL/min (ref >60)
GLUCOSE: 93 mg/dL (ref 65–99)
Potassium: 3.6 mmol/L (ref 3.5–5.1)
Sodium: 140 mmol/L (ref 135–145)

## 2015-04-06 LAB — URINE CULTURE

## 2015-04-06 LAB — CORTISOL-AM, BLOOD: CORTISOL - AM: 12.9 ug/dL (ref 6.7–22.6)

## 2015-04-06 LAB — APTT: APTT: 36 s (ref 24–37)

## 2015-04-06 LAB — HEMOGLOBIN A1C
HEMOGLOBIN A1C: 8.6 % — AB (ref 4.8–5.6)
MEAN PLASMA GLUCOSE: 200 mg/dL

## 2015-04-06 MED ORDER — DEXTROSE 5 % IV SOLN
1.0000 g | INTRAVENOUS | Status: DC
  Administered 2015-04-06: 1 g via INTRAVENOUS
  Filled 2015-04-06 (×2): qty 10

## 2015-04-06 NOTE — Progress Notes (Signed)
[facility] PROGRESS NOTE   Nicole Villa OZH:[id number] DOB: 12-07-1922 DOA: 04/05/2015 PCP: [name], MD  HPI/Subjective: Seen with daughter at bedside, feeling weak but better than yesterday. No fever or chills.  Assessment/Plan: Active Problems:   Hypertension   Sepsis (HCC)   UTI (lower urinary tract infection)   Acute encephalopathy   AKI (acute kidney injury) (HCC)   Sepsis -Present time of admission -The patient was hypothermic with tachycardia -This is likely secondary to UTI, aggressively hydrated with IV fluids. -Started on Rocephin, continue antibiotics and change according to culture results.  UTI -Presented with polyuria -Continue ceftriaxone pending culture data  Acute encephalopathy -Secondary to dehydration, acute kidney injury, and UTI -Check TSH -Serum B12 -am cortisol -EKG  AKI -Presented with creatinine of 1.37, this is improved to 0.8 after hydration with IV fluids.  Hypertension -Hold ARB in setting of AKI -Continue amlodipine  Diabetes mellitus type 2 -Discontinue Amaryl for now -Hemoglobin A1c -NovoLog sliding scale  Hyperlipidemia -Continue statin  Frequently falls -Per daughter patient frequently falls, PT/OT to evaluate.   Code Status: Full Code Family Communication: Plan discussed with the patient. Disposition Plan: Remains inpatient Diet: Diet Carb Modified Fluid consistency:: Thin; Room service appropriate?: Yes  Consultants:  None  Procedures:  None  Antibiotics:  None   Objective: Filed Vitals:   04/06/15 0810 04/06/15 1120  BP: 150/63 146/67  Pulse: 109 103  Temp: 98.7 F (37.1 C) 98.5 F (36.9 C)  Resp: 20 20    Intake/Output Summary (Last 24 hours) at 04/06/15 1218 Last data filed at 04/06/15 1018  Gross per 24 hour  Intake      0 ml  Output    351 ml  Net   -351 ml   Filed Weights   04/05/15 1838 04/06/15 0433  Weight: 56.9 kg (125 lb 7.1 oz) 57.924 kg (127 lb 11.2 oz)     Exam: General: Alert and awake, oriented x3, not in any acute distress. HEENT: anicteric sclera, pupils reactive to light and accommodation, EOMI CVS: S1-S2 clear, no murmur rubs or gallops Chest: clear to auscultation bilaterally, no wheezing, rales or rhonchi Abdomen: soft nontender, nondistended, normal bowel sounds, no organomegaly Extremities: no cyanosis, clubbing or edema noted bilaterally Neuro: Cranial nerves II-XII intact, no focal neurological deficits  Data Reviewed: Basic Metabolic Panel:  Recent Labs Lab 04/05/15 1034 04/06/15 0239  NA 141 140  K 4.4 3.6  CL 104 107  CO2 23 22  GLUCOSE 170* 93  BUN 35* 17  CREATININE 1.37* 0.81  CALCIUM 9.7 8.7*   Liver Function Tests:  Recent Labs Lab 04/05/15 1034  AST 39  ALT 21  ALKPHOS 80  BILITOT 0.7  PROT 6.9  ALBUMIN 3.4*   No results for input(s): LIPASE, AMYLASE in the last 168 hours. No results for input(s): AMMONIA in the last 168 hours. CBC:  Recent Labs Lab 04/05/15 1034 04/06/15 0239  WBC 10.9* 8.2  NEUTROABS 8.8*  --   HGB 13.2 11.6*  HCT 39.8 35.9*  MCV 85.0 84.9  PLT 300 291   Cardiac Enzymes: No results for input(s): CKTOTAL, CKMB, CKMBINDEX, TROPONINI in the last 168 hours. BNP (last 3 results) No results for input(s): BNP in the last 8760 hours.  ProBNP (last 3 results) No results for input(s): PROBNP in the last 8760 hours.  CBG:  Recent Labs Lab 04/05/15 1034 04/05/15 2132 04/06/15 0701 04/06/15 1117  GLUCAP 150* 168* 113* 155*    Micro No results found for  this or any previous visit (from the past 240 hour(s)).   Studies: Dg Ribs Unilateral W/chest Left  04/05/2015  CLINICAL DATA:  Found down, confused EXAM: LEFT RIBS AND CHEST - 3+ VIEW COMPARISON:  None. FINDINGS: Single view of the chest and two views of the left ribs are provided. There is cardiomegaly, moderate in degree. Lungs are clear. No pleural effusion seen. No pneumothorax seen. Degenerative changes  noted throughout the thoracic spine. No acute osseous abnormality. No left-sided rib fracture identified. IMPRESSION: Cardiomegaly. Lungs are clear. No left-sided rib fracture or displacement seen. Electronically Signed   By: Bary Richard M.D.   On: 04/05/2015 11:13   Ct Head Wo Contrast  04/05/2015  CLINICAL DATA:  Pain following fall.  Dementia. EXAM: CT HEAD WITHOUT CONTRAST CT CERVICAL SPINE WITHOUT CONTRAST TECHNIQUE: Multidetector CT imaging of the head and cervical spine was performed following the standard protocol without intravenous contrast. Multiplanar CT image reconstructions of the cervical spine were also generated. COMPARISON:  Head CT May 07, 2012; brain MRI May 08, 2012 FINDINGS: CT HEAD FINDINGS Moderate diffuse atrophy is stable. There is no intracranial mass, hemorrhage, extra-axial fluid collection, or midline shift. There is patchy small vessel disease in the centra semiovale bilaterally. Elsewhere gray-white compartments appear normal. No acute infarct evident. Bony calvarium appears intact. The mastoid air cells are clear. No intraorbital lesions are apparent. There are small air-fluid levels in each maxillary antrum. There is also a smaller air-fluid level in the left sphenoid sinus region. There is opacification of anterior ethmoid air cells bilaterally. CT CERVICAL SPINE FINDINGS There is no fracture or spondylolisthesis. The prevertebral soft tissues and predental space regions are normal. There is moderately severe disc space narrowing at C6-7. There is mild narrowing at C7-T1, C3-4, and C4-5. There is facet osteoarthritic change at most levels bilaterally. At C5-6 on the right, there is a calcified disc protrusion which is effacing the exiting nerve root on the right and causing mild impression on the thecal sac ventrally. There is a moderate calcified central and left paracentral disc protrusion at C4-5. There is exit foraminal narrowing at multiple levels due to bony  hypertrophy. There is calcification in each carotid artery. There is extensive cystic change in the region of the odontoid without cortical disruption. IMPRESSION: CT head: Atrophy with periventricular small vessel disease, stable. Sinusitis at several levels with several air-fluid levels, likely due to acute sinusitis. No intracranial mass, hemorrhage, or acute appearing infarct. No extra-axial fluid collections. CT cervical spine: Multilevel arthropathy. Mild spinal stenosis due to calcified disc protrusion at C5-6 on the right paracentrally. There is marked exit foraminal narrowing on the right at C5-6. No acute fracture or spondylolisthesis. Extensive cystic changes noted in the odontoid region. There is calcification in each carotid artery. Electronically Signed   By: Bretta Bang III M.D.   On: 04/05/2015 11:28   Ct Cervical Spine Wo Contrast  04/05/2015  CLINICAL DATA:  Pain following fall.  Dementia. EXAM: CT HEAD WITHOUT CONTRAST CT CERVICAL SPINE WITHOUT CONTRAST TECHNIQUE: Multidetector CT imaging of the head and cervical spine was performed following the standard protocol without intravenous contrast. Multiplanar CT image reconstructions of the cervical spine were also generated. COMPARISON:  Head CT May 07, 2012; brain MRI May 08, 2012 FINDINGS: CT HEAD FINDINGS Moderate diffuse atrophy is stable. There is no intracranial mass, hemorrhage, extra-axial fluid collection, or midline shift. There is patchy small vessel disease in the centra semiovale bilaterally. Elsewhere gray-white compartments appear normal. No  acute infarct evident. Bony calvarium appears intact. The mastoid air cells are clear. No intraorbital lesions are apparent. There are small air-fluid levels in each maxillary antrum. There is also a smaller air-fluid level in the left sphenoid sinus region. There is opacification of anterior ethmoid air cells bilaterally. CT CERVICAL SPINE FINDINGS There is no fracture or  spondylolisthesis. The prevertebral soft tissues and predental space regions are normal. There is moderately severe disc space narrowing at C6-7. There is mild narrowing at C7-T1, C3-4, and C4-5. There is facet osteoarthritic change at most levels bilaterally. At C5-6 on the right, there is a calcified disc protrusion which is effacing the exiting nerve root on the right and causing mild impression on the thecal sac ventrally. There is a moderate calcified central and left paracentral disc protrusion at C4-5. There is exit foraminal narrowing at multiple levels due to bony hypertrophy. There is calcification in each carotid artery. There is extensive cystic change in the region of the odontoid without cortical disruption. IMPRESSION: CT head: Atrophy with periventricular small vessel disease, stable. Sinusitis at several levels with several air-fluid levels, likely due to acute sinusitis. No intracranial mass, hemorrhage, or acute appearing infarct. No extra-axial fluid collections. CT cervical spine: Multilevel arthropathy. Mild spinal stenosis due to calcified disc protrusion at C5-6 on the right paracentrally. There is marked exit foraminal narrowing on the right at C5-6. No acute fracture or spondylolisthesis. Extensive cystic changes noted in the odontoid region. There is calcification in each carotid artery. Electronically Signed   By: Bretta Bang III M.D.   On: 04/05/2015 11:28   Dg Hip Unilat With Pelvis 2-3 Views Right  04/05/2015  CLINICAL DATA:  Pain after fall. EXAM: DG HIP (WITH OR WITHOUT PELVIS) 2-3V RIGHT COMPARISON:  None. FINDINGS: There are degenerative changes in the hips. No fractures are identified on today's study. A calcification in the pelvis is likely a fibroid. IMPRESSION: No acute abnormality. Electronically Signed   By: Gerome Sam III M.D   On: 04/05/2015 11:13   Dg Femur Min 2 Views Left  04/05/2015  CLINICAL DATA:  Found on floor, confused, frequent falls recently. EXAM:  LEFT FEMUR 2 VIEWS COMPARISON:  None. FINDINGS: AP and lateral views of the left femur are provided. No osseous fracture or dislocation seen. Left femoral head appears well positioned relative to the acetabulum. Prominent atherosclerotic calcifications noted within the soft tissues of the left thigh. Surrounding soft tissues are otherwise unremarkable. Degenerative changes noted at the left knee joint with associated chondrocalcinosis indicating underlying CPPD. IMPRESSION: No acute findings.  No osseous fracture or dislocation. Electronically Signed   By: Bary Richard M.D.   On: 04/05/2015 11:11    Scheduled Meds: . cefTRIAXone (ROCEPHIN)  IV  1 g Intravenous Q24H  . clopidogrel  75 mg Oral Q breakfast  . enoxaparin (LOVENOX) injection  40 mg Subcutaneous Q24H  . insulin aspart  0-9 Units Subcutaneous TID WC  . omega-3 acid ethyl esters  1 g Oral Daily  . pantoprazole  40 mg Oral Daily  . pravastatin  20 mg Oral Daily  . sodium chloride flush  3 mL Intravenous Q12H   Continuous Infusions:      Time spent: 35 minutes    [facility] A  [facility] Pager [phone] If 7PM-7AM, please contact night-coverage at [email or web address], password [email or web address] 04/06/2015, 12:18 PM  LOS: 1 day

## 2015-04-06 NOTE — Evaluation (Signed)
Physical Therapy Evaluation Patient Details Name: Nicole Villa MRN: [id number] DOB: 1923-02-26 Today's Date: 04/06/2015   History of Present Illness  82 year old female with a history of diabetes mellitus, hypertension, GERD, hyperlipidemia presented with one-week history of increasing confusion with hallucinations and 3 falls in 4 days.  Found to have UTI.  Clinical Impression  Patient presents with decreased mobility due to deficits listed in PT problem list.  She will benefit from skilled PT in the acute setting to allow return home with family support and HHPT.  Currently at extremely high fall risk so recommending mainly w/c mobility in the home with spouse assist.  If, however, pt doesn't progress, may need SNF placement.  Family hopeful for home with support if pt progresses.    Follow Up Recommendations Home health PT;Supervision/Assistance - 24 hour    Equipment Recommendations  Wheelchair (measurements PT);Wheelchair cushion (measurements PT);Rolling walker with 5" wheels (youth size walker, 16x16 hemi height w/c)    Recommendations for Other Services       Precautions / Restrictions Precautions Precautions: Fall      Mobility  Bed Mobility Overal bed mobility: Needs Assistance Bed Mobility: Rolling;Sidelying to Sit Rolling: Min assist Sidelying to sit: Mod assist;HOB elevated       General bed mobility comments: cues for technique, increased time required  Transfers Overall transfer level: Needs assistance Equipment used: Rolling walker (2 wheeled) Transfers: Sit to/from Stand Sit to Stand: Mod assist         General transfer comment: lifting assist from bed, to sit mod A for safety due to height and pt sitting on very edge of bed with fall risk, assist to scoot back on bed  Ambulation/Gait Ambulation/Gait assistance: Mod assist;Max assist Ambulation Distance (Feet): 70 Feet Assistive device: Rolling walker (2 wheeled) Gait Pattern/deviations:  Step-through pattern;Decreased stride length;Ataxic;Wide base of support;Staggering right     General Gait Details: flexed posture with walker too far ahead of pt, assist to keep walker close, staggered to R and pt reaching for rail with mod/max support to prevent falling, cues for holding walker and keeping eyes straight ahead for orientation  Stairs            Wheelchair Mobility    Modified Rankin (Stroke Patients Only)       Balance Overall balance assessment: Needs assistance   Sitting balance-Leahy Scale: Fair     Standing balance support: Bilateral upper extremity supported Standing balance-Leahy Scale: Poor Standing balance comment: min A and UE support for balance                             Pertinent Vitals/Pain Pain Assessment: Faces Faces Pain Scale: Hurts even more Pain Location: generalized from falls Pain Descriptors / Indicators: Sore Pain Intervention(s): Limited activity within patient's tolerance;Monitored during session    Home Living Family/patient expects to be discharged to:: Private residence Living Arrangements: Spouse/significant other (daughter lives across the street) Available Help at Discharge: Family;Available 24 hours/day Type of Home: House Home Access: Stairs to enter Entrance Stairs-Rails: Can reach both (grabbars) Entrance Stairs-Number of Steps: 2 Home Layout: One level Home Equipment: Walker - 4 wheels;Other (comment) (tub seat lift) Additional Comments: tub seat lifts and lowers her into tub, but she has to step over tub edge    Prior Function Level of Independence: Needs assistance   Gait / Transfers Assistance Needed: used walker intermittently (would forget)  ADL's / Homemaking Assistance Needed: daughter helps  with bath        Hand Dominance   Dominant Hand: Right    Extremity/Trunk Assessment   Upper Extremity Assessment: RUE deficits/detail;LUE deficits/detail RUE Deficits / Details: limited  shoulder AROM, AAROM grossly WFL, unable to hold antigravity for long, elbow flexion 4/5, extension 3+/5     LUE Deficits / Details: limited shoulder AROM, AAROM grossly WFL, unable to hold antigravity for long, elbow flexion 4-/5, extension 3-/5   Lower Extremity Assessment: RLE deficits/detail;LLE deficits/detail RLE Deficits / Details: AAROM grossly WFL, strength at least 3+/5 LLE Deficits / Details: AAROM grossly WFL, strength at least 3+/5; bruising on R lateral thigh with soreness  Cervical / Trunk Assessment: Kyphotic  Communication   Communication: No difficulties  Cognition Arousal/Alertness: Awake/alert   Overall Cognitive Status: Impaired/Different from baseline Area of Impairment: Orientation;Memory;Safety/judgement;Attention Orientation Level: Disoriented to;Time;Place Current Attention Level: Sustained     Safety/Judgement: Decreased awareness of safety;Decreased awareness of deficits     General Comments: hallucinating about having things in her hands    General Comments General comments (skin integrity, edema, etc.): bruising on L ribs, on chin and L cheek    Exercises        Assessment/Plan    PT Assessment Patient needs continued PT services  PT Diagnosis Abnormality of gait;Altered mental status   PT Problem List Decreased strength;Decreased cognition;Decreased activity tolerance;Decreased balance;Decreased mobility;Decreased coordination;Decreased safety awareness;Decreased knowledge of use of DME  PT Treatment Interventions DME instruction;Balance training;Gait training;Functional mobility training;Patient/family education;Wheelchair mobility training;Therapeutic exercise;Therapeutic activities;Stair training   PT Goals (Current goals can be found in the Care Plan section) Acute Rehab PT Goals Patient Stated Goal: to go home PT Goal Formulation: With family Time For Goal Achievement: 04/20/15 Potential to Achieve Goals: Fair    Frequency Min  3X/week   Barriers to discharge        Co-evaluation               End of Session Equipment Utilized During Treatment: Gait belt Activity Tolerance: Patient limited by fatigue Patient left: with call bell/phone within reach;with chair alarm set;in chair;with family/visitor present      Functional Assessment Tool Used: Clinical Observation Functional Limitation: Mobility: Walking and moving around Mobility: Walking and Moving Around Current Status [phone]): At least 40 percent but less than 60 percent impaired, limited or restricted Mobility: Walking and Moving Around Goal Status [phone]): At least 20 percent but less than 40 percent impaired, limited or restricted    Time: 1125-1151 PT Time Calculation (min) (ACUTE ONLY): 26 min   Charges:   PT Evaluation $PT Eval High Complexity: 1 Procedure PT Treatments $Gait Training: 8-22 mins   PT G Codes:   PT G-Codes **NOT FOR INPATIENT CLASS** Functional Assessment Tool Used: Clinical Observation Functional Limitation: Mobility: Walking and moving around Mobility: Walking and Moving Around Current Status (A2130): At least 40 percent but less than 60 percent impaired, limited or restricted Mobility: Walking and Moving Around Goal Status [phone]): At least 20 percent but less than 40 percent impaired, limited or restricted    Elray Mcgregor 04/06/2015, 12:56 PM  Sheran Lawless, PT [phone] 04/06/2015

## 2015-04-07 DIAGNOSIS — N390 Urinary tract infection, site not specified: Secondary | ICD-10-CM

## 2015-04-07 DIAGNOSIS — G9340 Encephalopathy, unspecified: Secondary | ICD-10-CM

## 2015-04-07 DIAGNOSIS — I10 Essential (primary) hypertension: Secondary | ICD-10-CM

## 2015-04-07 DIAGNOSIS — N179 Acute kidney failure, unspecified: Secondary | ICD-10-CM

## 2015-04-07 DIAGNOSIS — A419 Sepsis, unspecified organism: Principal | ICD-10-CM

## 2015-04-07 LAB — GLUCOSE, CAPILLARY: GLUCOSE-CAPILLARY: 103 mg/dL — AB (ref 65–99)

## 2015-04-07 MED ORDER — CEFUROXIME AXETIL 250 MG PO TABS: 250.0000 mg | ORAL_TABLET | Freq: Two times a day (BID) | ORAL | Status: DC

## 2015-04-07 NOTE — Discharge Summary (Signed)
Physician Discharge Summary  Nicole Villa YNW:[id number] DOB: 11-May-1922 DOA: 04/05/2015  PCP: [name], MD  Admit date: 04/05/2015 Discharge date: 04/07/2015  Time spent: 45 minutes  Recommendations for Outpatient Follow-up:  Patient will be discharged to home with home health PT.  Patient will need to follow up with primary care provider within one week of discharge.  Patient should continue medications as prescribed.  Patient should follow a heart healthy/carb modified diet.   Discharge Diagnoses:  Sepsis secondary UTI Acute encephalopathy Acute kidney injury Essential hypertension Diabetes mellitus, type II Hyperlipidemia Frequent falls  Discharge Condition: Stable  Diet recommendation: Heart healthy/carb modified  Filed Weights   04/05/15 1838 04/06/15 0433 04/07/15 0545  Weight: 56.9 kg (125 lb 7.1 oz) 57.924 kg (127 lb 11.2 oz) 57.335 kg (126 lb 6.4 oz)    History of present illness:  On 04/05/2015 By Dr. Onalee Hua Tat  82 year old female with a history of diabetes mellitus, hypertension, GERD, hyperlipidemia presented with one-week history of increasing confusion. The patient is unable to provide any history at this time secondary to her mental status change. On the day of admission, the patient sustained a mechanical fall. She was not able to get up off the floor. EMS was activated by the patient's husband. Apparently, the patient has been having visual hallucinations and seeing things on the floor. She was trying to pick up something on the floor and fell. Her daughter notes that the patient has been increasingly confused over the past week in the setting of underlying dementia. Daughter also states that the patient has had 3 falls in the past 4 days. The patient is supposed to be using a walker, but frequently does not use it. There has not been any reports of fevers, headaches, chest pain, sinus breath, vomiting, diarrhea, vomiting, dysuria. After admission, CT of the  brain was negative for acute intracranial process but did reveal some sinusitis with air-fluid levels. Etiology of the cervical spine was negative for any fractures. X-ray of the hips were negative for fractures. Rib series was negative. Lactic acid was 1.61 with a BBC 10.9. Serum creatinine was 1.37. She was noted to be hypothermic with a temperature 95.8 and tachycardic up to 110.  Hospital Course:  Sepsis secondary UTI -Upon admission, patient was hypothermic with tachycardia -Vitals stable  -Patient given IVF and started on Rocephin -Urine culture showed multiple species  -Blood cultures negative to date -Will discharge patient with Ceftin  Acute Encephalopathy -Resolved, Secondary to dehydration, AKI, UTI -TSH 2.055 -Vit B12 652  Acute Kidney Injury -Resolved -Secondary to sepsis   Essential Hypertension  -ARB initially upon admission due to AKI -May resume home regimen upon discharge  Diabetes Mellitus, Type 2 -Resume home regimen upon discharge -Hemoglobin A1c 8.6  Hyperlipidemia -continue statin  Frequent Falls -PT consulted, recommended home health PT  Procedures: None  Consultations: None  Discharge Exam: Filed Vitals:   04/06/15 2114 04/07/15 0545  BP: 136/72 163/72  Pulse: 113 104  Temp: 98.3 F (36.8 C) 97.7 F (36.5 C)  Resp: 20 20     General: Well developed, well nourished, NAD, appears stated age  HEENT: NCAT,mucous membranes moist.  Cardiovascular: S1 S2 auscultated, soft SEM. Regular rate and rhythm.  Respiratory: Clear to auscultation bilaterally with equal chest rise  Abdomen: Soft, nontender, nondistended, + bowel sounds  Extremities: warm dry without cyanosis clubbing or edema  Neuro: AAOx3, nonfocal  Psych: Normal affect and demeanor   Discharge Instructions  Discharge Instructions  Face-to-face encounter (required for Medicare/Medicaid patients)    Complete by:  As directed   I Anselm Pancoast certify that this patient is  under my care and that I, or a nurse practitioner or physician's assistant working with me, had a face-to-face encounter that meets the physician face-to-face encounter requirements with this patient on 04/05/2015. The encounter with the patient was in whole, or in part for the following medical condition(s) which is the primary reason for home health care (List medical condition): Deconditioning  The encounter with the patient was in whole, or in part, for the following medical condition, which is the primary reason for home health care:  deconditioning  I certify that, based on my findings, the following services are medically necessary home health services:   Nursing Physical therapy    Reason for Medically Necessary Home Health Services:  Other See Comments  My clinical findings support the need for the above services:  OTHER SEE COMMENTS  Further, I certify that my clinical findings support that this patient is homebound due to:  Mental confusion     Home Health    Complete by:  As directed   To provide the following care/treatments:   RN Home Health Aide PT              Medication List    TAKE these medications        acetaminophen 325 MG tablet  Commonly known as:  TYLENOL  Take 650 mg by mouth at bedtime.     amLODipine 10 MG tablet  Commonly known as:  NORVASC  Take 10 mg by mouth daily.     beta carotene w/minerals tablet  Take 1 tablet by mouth daily.     cefUROXime 250 MG tablet  Commonly known as:  CEFTIN  Take 1 tablet (250 mg total) by mouth 2 (two) times daily with a meal.     cholecalciferol 1000 units tablet  Commonly known as:  VITAMIN D  Take 1,000 Units by mouth daily.     clopidogrel 75 MG tablet  Commonly known as:  PLAVIX  Take 1 tablet (75 mg total) by mouth daily with breakfast.     fish oil-omega-3 fatty acids 1000 MG capsule  Take 1 g by mouth daily.     glimepiride 1 MG tablet  Commonly known as:  AMARYL  Take 0.5 mg by mouth See admin  instructions. Take a 1/2 tablet with the first meal of the day orally twice daily     HYDROcodone-acetaminophen 5-325 MG tablet  Commonly known as:  NORCO/VICODIN  Take 0.5 tablets by mouth 2 (two) times daily.     irbesartan 300 MG tablet  Commonly known as:  AVAPRO  Take 1 tablet (300 mg total) by mouth daily.     nabumetone 500 MG tablet  Commonly known as:  RELAFEN  Take 500 mg by mouth daily.     pantoprazole 40 MG tablet  Commonly known as:  PROTONIX  Take 40 mg by mouth daily.     pravastatin 20 MG tablet  Commonly known as:  PRAVACHOL  Take 20 mg by mouth daily.       Allergies  Allergen Reactions  . Lisinopril Cough       Follow-up Information    Follow up with SHAW,KIMBERLEE, MD. Schedule an appointment as soon as possible for a visit in 1 week.   Specialty:  Family Medicine   Why:  Hospital follow up   Contact information:  [address] [place] [address] [phone]        The results of significant diagnostics from this hospitalization (including imaging, microbiology, ancillary and laboratory) are listed below for reference.    Significant Diagnostic Studies: Dg Ribs Unilateral W/chest Left  04/05/2015  CLINICAL DATA:  Found down, confused EXAM: LEFT RIBS AND CHEST - 3+ VIEW COMPARISON:  None. FINDINGS: Single view of the chest and two views of the left ribs are provided. There is cardiomegaly, moderate in degree. Lungs are clear. No pleural effusion seen. No pneumothorax seen. Degenerative changes noted throughout the thoracic spine. No acute osseous abnormality. No left-sided rib fracture identified. IMPRESSION: Cardiomegaly. Lungs are clear. No left-sided rib fracture or displacement seen. Electronically Signed   By: Bary Richard M.D.   On: 04/05/2015 11:13   Ct Head Wo Contrast  04/05/2015  CLINICAL DATA:  Pain following fall.  Dementia. EXAM: CT HEAD WITHOUT CONTRAST CT CERVICAL SPINE WITHOUT CONTRAST TECHNIQUE: Multidetector CT  imaging of the head and cervical spine was performed following the standard protocol without intravenous contrast. Multiplanar CT image reconstructions of the cervical spine were also generated. COMPARISON:  Head CT May 07, 2012; brain MRI May 08, 2012 FINDINGS: CT HEAD FINDINGS Moderate diffuse atrophy is stable. There is no intracranial mass, hemorrhage, extra-axial fluid collection, or midline shift. There is patchy small vessel disease in the centra semiovale bilaterally. Elsewhere gray-white compartments appear normal. No acute infarct evident. Bony calvarium appears intact. The mastoid air cells are clear. No intraorbital lesions are apparent. There are small air-fluid levels in each maxillary antrum. There is also a smaller air-fluid level in the left sphenoid sinus region. There is opacification of anterior ethmoid air cells bilaterally. CT CERVICAL SPINE FINDINGS There is no fracture or spondylolisthesis. The prevertebral soft tissues and predental space regions are normal. There is moderately severe disc space narrowing at C6-7. There is mild narrowing at C7-T1, C3-4, and C4-5. There is facet osteoarthritic change at most levels bilaterally. At C5-6 on the right, there is a calcified disc protrusion which is effacing the exiting nerve root on the right and causing mild impression on the thecal sac ventrally. There is a moderate calcified central and left paracentral disc protrusion at C4-5. There is exit foraminal narrowing at multiple levels due to bony hypertrophy. There is calcification in each carotid artery. There is extensive cystic change in the region of the odontoid without cortical disruption. IMPRESSION: CT head: Atrophy with periventricular small vessel disease, stable. Sinusitis at several levels with several air-fluid levels, likely due to acute sinusitis. No intracranial mass, hemorrhage, or acute appearing infarct. No extra-axial fluid collections. CT cervical spine: Multilevel  arthropathy. Mild spinal stenosis due to calcified disc protrusion at C5-6 on the right paracentrally. There is marked exit foraminal narrowing on the right at C5-6. No acute fracture or spondylolisthesis. Extensive cystic changes noted in the odontoid region. There is calcification in each carotid artery. Electronically Signed   By: Bretta Bang III M.D.   On: 04/05/2015 11:28   Ct Cervical Spine Wo Contrast  04/05/2015  CLINICAL DATA:  Pain following fall.  Dementia. EXAM: CT HEAD WITHOUT CONTRAST CT CERVICAL SPINE WITHOUT CONTRAST TECHNIQUE: Multidetector CT imaging of the head and cervical spine was performed following the standard protocol without intravenous contrast. Multiplanar CT image reconstructions of the cervical spine were also generated. COMPARISON:  Head CT May 07, 2012; brain MRI May 08, 2012 FINDINGS: CT HEAD FINDINGS Moderate diffuse atrophy is stable.  There is no intracranial mass, hemorrhage, extra-axial fluid collection, or midline shift. There is patchy small vessel disease in the centra semiovale bilaterally. Elsewhere gray-white compartments appear normal. No acute infarct evident. Bony calvarium appears intact. The mastoid air cells are clear. No intraorbital lesions are apparent. There are small air-fluid levels in each maxillary antrum. There is also a smaller air-fluid level in the left sphenoid sinus region. There is opacification of anterior ethmoid air cells bilaterally. CT CERVICAL SPINE FINDINGS There is no fracture or spondylolisthesis. The prevertebral soft tissues and predental space regions are normal. There is moderately severe disc space narrowing at C6-7. There is mild narrowing at C7-T1, C3-4, and C4-5. There is facet osteoarthritic change at most levels bilaterally. At C5-6 on the right, there is a calcified disc protrusion which is effacing the exiting nerve root on the right and causing mild impression on the thecal sac ventrally. There is a moderate calcified  central and left paracentral disc protrusion at C4-5. There is exit foraminal narrowing at multiple levels due to bony hypertrophy. There is calcification in each carotid artery. There is extensive cystic change in the region of the odontoid without cortical disruption. IMPRESSION: CT head: Atrophy with periventricular small vessel disease, stable. Sinusitis at several levels with several air-fluid levels, likely due to acute sinusitis. No intracranial mass, hemorrhage, or acute appearing infarct. No extra-axial fluid collections. CT cervical spine: Multilevel arthropathy. Mild spinal stenosis due to calcified disc protrusion at C5-6 on the right paracentrally. There is marked exit foraminal narrowing on the right at C5-6. No acute fracture or spondylolisthesis. Extensive cystic changes noted in the odontoid region. There is calcification in each carotid artery. Electronically Signed   By: Bretta Bang III M.D.   On: 04/05/2015 11:28   Dg Hip Unilat With Pelvis 2-3 Views Right  04/05/2015  CLINICAL DATA:  Pain after fall. EXAM: DG HIP (WITH OR WITHOUT PELVIS) 2-3V RIGHT COMPARISON:  None. FINDINGS: There are degenerative changes in the hips. No fractures are identified on today's study. A calcification in the pelvis is likely a fibroid. IMPRESSION: No acute abnormality. Electronically Signed   By: Gerome Sam III M.D   On: 04/05/2015 11:13   Dg Femur Min 2 Views Left  04/05/2015  CLINICAL DATA:  Found on floor, confused, frequent falls recently. EXAM: LEFT FEMUR 2 VIEWS COMPARISON:  None. FINDINGS: AP and lateral views of the left femur are provided. No osseous fracture or dislocation seen. Left femoral head appears well positioned relative to the acetabulum. Prominent atherosclerotic calcifications noted within the soft tissues of the left thigh. Surrounding soft tissues are otherwise unremarkable. Degenerative changes noted at the left knee joint with associated chondrocalcinosis indicating underlying  CPPD. IMPRESSION: No acute findings.  No osseous fracture or dislocation. Electronically Signed   By: Bary Richard M.D.   On: 04/05/2015 11:11    Microbiology: Recent Results (from the past 240 hour(s))  Urine culture     Status: None   Collection Time: 04/05/15 11:29 AM  Result Value Ref Range Status   Specimen Description URINE, CLEAN CATCH  Final   Special Requests NONE  Final   Culture MULTIPLE SPECIES PRESENT, SUGGEST RECOLLECTION  Final   Report Status 04/06/2015 FINAL  Final  Culture, blood (routine x 2)     Status: None (Preliminary result)   Collection Time: 04/05/15  3:00 PM  Result Value Ref Range Status   Specimen Description BLOOD RIGHT ANTECUBITAL  Final   Special Requests BOTTLES DRAWN AEROBIC  AND ANAEROBIC 5CC  Final   Culture NO GROWTH < 24 HOURS  Final   Report Status PENDING  Incomplete  Culture, blood (routine x 2)     Status: None (Preliminary result)   Collection Time: 04/05/15  3:12 PM  Result Value Ref Range Status   Specimen Description BLOOD RIGHT FOREARM  Final   Special Requests BOTTLES DRAWN AEROBIC AND ANAEROBIC 5CC  Final   Culture NO GROWTH < 24 HOURS  Final   Report Status PENDING  Incomplete     Labs: Basic Metabolic Panel:  Recent Labs Lab 04/05/15 1034 04/06/15 0239  NA 141 140  K 4.4 3.6  CL 104 107  CO2 23 22  GLUCOSE 170* 93  BUN 35* 17  CREATININE 1.37* 0.81  CALCIUM 9.7 8.7*   Liver Function Tests:  Recent Labs Lab 04/05/15 1034  AST 39  ALT 21  ALKPHOS 80  BILITOT 0.7  PROT 6.9  ALBUMIN 3.4*   No results for input(s): LIPASE, AMYLASE in the last 168 hours. No results for input(s): AMMONIA in the last 168 hours. CBC:  Recent Labs Lab 04/05/15 1034 04/06/15 0239  WBC 10.9* 8.2  NEUTROABS 8.8*  --   HGB 13.2 11.6*  HCT 39.8 35.9*  MCV 85.0 84.9  PLT 300 291   Cardiac Enzymes: No results for input(s): CKTOTAL, CKMB, CKMBINDEX, TROPONINI in the last 168 hours. BNP: BNP (last 3 results) No results for  input(s): BNP in the last 8760 hours.  ProBNP (last 3 results) No results for input(s): PROBNP in the last 8760 hours.  CBG:  Recent Labs Lab 04/06/15 0701 04/06/15 1117 04/06/15 1608 04/06/15 2117 04/07/15 0651  GLUCAP 113* 155* 102* 124* 103*       Signed:  Tiffanyann Deroo  [facility] 04/07/2015, 9:37 AM

## 2015-04-07 NOTE — Discharge Instructions (Signed)

## 2015-04-07 NOTE — Progress Notes (Signed)
Talked to patient with her daughter and granddaughter present about needing any possible DME; noted CM arranged HHC with [facility]. Daughter stated that she has a Product manager at home but she does not like it and is working on getting her a standard walker, she requested that patient receives a wheelchair. Wheelchair with cushion ordered as requested and to be delivered to the room today prior to discharging home. Abelino Derrick RN,MHA,BSM [phone]

## 2015-04-09 DIAGNOSIS — N390 Urinary tract infection, site not specified: Secondary | ICD-10-CM | POA: Diagnosis not present

## 2015-04-09 DIAGNOSIS — A419 Sepsis, unspecified organism: Secondary | ICD-10-CM | POA: Diagnosis not present

## 2015-04-09 DIAGNOSIS — E785 Hyperlipidemia, unspecified: Secondary | ICD-10-CM | POA: Diagnosis not present

## 2015-04-09 DIAGNOSIS — F0391 Unspecified dementia with behavioral disturbance: Secondary | ICD-10-CM | POA: Diagnosis not present

## 2015-04-09 DIAGNOSIS — R296 Repeated falls: Secondary | ICD-10-CM | POA: Diagnosis not present

## 2015-04-09 DIAGNOSIS — W19XXXD Unspecified fall, subsequent encounter: Secondary | ICD-10-CM | POA: Diagnosis not present

## 2015-04-09 DIAGNOSIS — I10 Essential (primary) hypertension: Secondary | ICD-10-CM | POA: Diagnosis not present

## 2015-04-09 DIAGNOSIS — Z7984 Long term (current) use of oral hypoglycemic drugs: Secondary | ICD-10-CM | POA: Diagnosis not present

## 2015-04-09 DIAGNOSIS — E119 Type 2 diabetes mellitus without complications: Secondary | ICD-10-CM | POA: Diagnosis not present

## 2015-04-10 LAB — CULTURE, BLOOD (ROUTINE X 2)
CULTURE: NO GROWTH
Culture: NO GROWTH

## 2015-04-12 DIAGNOSIS — N390 Urinary tract infection, site not specified: Secondary | ICD-10-CM | POA: Diagnosis not present

## 2015-04-12 DIAGNOSIS — I10 Essential (primary) hypertension: Secondary | ICD-10-CM | POA: Diagnosis not present

## 2015-04-12 DIAGNOSIS — E119 Type 2 diabetes mellitus without complications: Secondary | ICD-10-CM | POA: Diagnosis not present

## 2015-04-12 DIAGNOSIS — E785 Hyperlipidemia, unspecified: Secondary | ICD-10-CM | POA: Diagnosis not present

## 2015-04-12 DIAGNOSIS — A419 Sepsis, unspecified organism: Secondary | ICD-10-CM | POA: Diagnosis not present

## 2015-04-12 DIAGNOSIS — F0391 Unspecified dementia with behavioral disturbance: Secondary | ICD-10-CM | POA: Diagnosis not present

## 2015-04-13 DIAGNOSIS — F0390 Unspecified dementia without behavioral disturbance: Secondary | ICD-10-CM | POA: Diagnosis not present

## 2015-04-13 DIAGNOSIS — E1122 Type 2 diabetes mellitus with diabetic chronic kidney disease: Secondary | ICD-10-CM | POA: Diagnosis not present

## 2015-04-13 DIAGNOSIS — Z7984 Long term (current) use of oral hypoglycemic drugs: Secondary | ICD-10-CM | POA: Diagnosis not present

## 2015-04-13 DIAGNOSIS — A419 Sepsis, unspecified organism: Secondary | ICD-10-CM | POA: Diagnosis not present

## 2015-04-13 DIAGNOSIS — G9340 Encephalopathy, unspecified: Secondary | ICD-10-CM | POA: Diagnosis not present

## 2015-04-13 DIAGNOSIS — N390 Urinary tract infection, site not specified: Secondary | ICD-10-CM | POA: Diagnosis not present

## 2015-04-14 DIAGNOSIS — F0391 Unspecified dementia with behavioral disturbance: Secondary | ICD-10-CM | POA: Diagnosis not present

## 2015-04-14 DIAGNOSIS — N390 Urinary tract infection, site not specified: Secondary | ICD-10-CM | POA: Diagnosis not present

## 2015-04-14 DIAGNOSIS — E119 Type 2 diabetes mellitus without complications: Secondary | ICD-10-CM | POA: Diagnosis not present

## 2015-04-14 DIAGNOSIS — I10 Essential (primary) hypertension: Secondary | ICD-10-CM | POA: Diagnosis not present

## 2015-04-14 DIAGNOSIS — E785 Hyperlipidemia, unspecified: Secondary | ICD-10-CM | POA: Diagnosis not present

## 2015-04-14 DIAGNOSIS — A419 Sepsis, unspecified organism: Secondary | ICD-10-CM | POA: Diagnosis not present

## 2015-04-16 DIAGNOSIS — N390 Urinary tract infection, site not specified: Secondary | ICD-10-CM | POA: Diagnosis not present

## 2015-04-16 DIAGNOSIS — I10 Essential (primary) hypertension: Secondary | ICD-10-CM | POA: Diagnosis not present

## 2015-04-16 DIAGNOSIS — A419 Sepsis, unspecified organism: Secondary | ICD-10-CM | POA: Diagnosis not present

## 2015-04-16 DIAGNOSIS — E785 Hyperlipidemia, unspecified: Secondary | ICD-10-CM | POA: Diagnosis not present

## 2015-04-16 DIAGNOSIS — F0391 Unspecified dementia with behavioral disturbance: Secondary | ICD-10-CM | POA: Diagnosis not present

## 2015-04-16 DIAGNOSIS — E119 Type 2 diabetes mellitus without complications: Secondary | ICD-10-CM | POA: Diagnosis not present

## 2015-04-20 DIAGNOSIS — N390 Urinary tract infection, site not specified: Secondary | ICD-10-CM | POA: Diagnosis not present

## 2015-04-20 DIAGNOSIS — F0391 Unspecified dementia with behavioral disturbance: Secondary | ICD-10-CM | POA: Diagnosis not present

## 2015-04-20 DIAGNOSIS — E119 Type 2 diabetes mellitus without complications: Secondary | ICD-10-CM | POA: Diagnosis not present

## 2015-04-20 DIAGNOSIS — E785 Hyperlipidemia, unspecified: Secondary | ICD-10-CM | POA: Diagnosis not present

## 2015-04-20 DIAGNOSIS — I10 Essential (primary) hypertension: Secondary | ICD-10-CM | POA: Diagnosis not present

## 2015-04-20 DIAGNOSIS — A419 Sepsis, unspecified organism: Secondary | ICD-10-CM | POA: Diagnosis not present

## 2015-04-22 DIAGNOSIS — F0391 Unspecified dementia with behavioral disturbance: Secondary | ICD-10-CM | POA: Diagnosis not present

## 2015-04-22 DIAGNOSIS — E785 Hyperlipidemia, unspecified: Secondary | ICD-10-CM | POA: Diagnosis not present

## 2015-04-22 DIAGNOSIS — E119 Type 2 diabetes mellitus without complications: Secondary | ICD-10-CM | POA: Diagnosis not present

## 2015-04-22 DIAGNOSIS — I10 Essential (primary) hypertension: Secondary | ICD-10-CM | POA: Diagnosis not present

## 2015-04-22 DIAGNOSIS — A419 Sepsis, unspecified organism: Secondary | ICD-10-CM | POA: Diagnosis not present

## 2015-04-22 DIAGNOSIS — N390 Urinary tract infection, site not specified: Secondary | ICD-10-CM | POA: Diagnosis not present

## 2015-04-23 DIAGNOSIS — A419 Sepsis, unspecified organism: Secondary | ICD-10-CM | POA: Diagnosis not present

## 2015-04-23 DIAGNOSIS — I10 Essential (primary) hypertension: Secondary | ICD-10-CM | POA: Diagnosis not present

## 2015-04-23 DIAGNOSIS — E785 Hyperlipidemia, unspecified: Secondary | ICD-10-CM | POA: Diagnosis not present

## 2015-04-23 DIAGNOSIS — N390 Urinary tract infection, site not specified: Secondary | ICD-10-CM | POA: Diagnosis not present

## 2015-04-23 DIAGNOSIS — E119 Type 2 diabetes mellitus without complications: Secondary | ICD-10-CM | POA: Diagnosis not present

## 2015-04-23 DIAGNOSIS — F0391 Unspecified dementia with behavioral disturbance: Secondary | ICD-10-CM | POA: Diagnosis not present

## 2015-04-27 DIAGNOSIS — A419 Sepsis, unspecified organism: Secondary | ICD-10-CM | POA: Diagnosis not present

## 2015-04-27 DIAGNOSIS — E119 Type 2 diabetes mellitus without complications: Secondary | ICD-10-CM | POA: Diagnosis not present

## 2015-04-27 DIAGNOSIS — F0391 Unspecified dementia with behavioral disturbance: Secondary | ICD-10-CM | POA: Diagnosis not present

## 2015-04-27 DIAGNOSIS — N390 Urinary tract infection, site not specified: Secondary | ICD-10-CM | POA: Diagnosis not present

## 2015-04-27 DIAGNOSIS — E785 Hyperlipidemia, unspecified: Secondary | ICD-10-CM | POA: Diagnosis not present

## 2015-04-27 DIAGNOSIS — I10 Essential (primary) hypertension: Secondary | ICD-10-CM | POA: Diagnosis not present

## 2015-04-27 DIAGNOSIS — R300 Dysuria: Secondary | ICD-10-CM | POA: Diagnosis not present

## 2015-04-28 DIAGNOSIS — F0391 Unspecified dementia with behavioral disturbance: Secondary | ICD-10-CM | POA: Diagnosis not present

## 2015-04-28 DIAGNOSIS — A419 Sepsis, unspecified organism: Secondary | ICD-10-CM | POA: Diagnosis not present

## 2015-04-28 DIAGNOSIS — I10 Essential (primary) hypertension: Secondary | ICD-10-CM | POA: Diagnosis not present

## 2015-04-28 DIAGNOSIS — E785 Hyperlipidemia, unspecified: Secondary | ICD-10-CM | POA: Diagnosis not present

## 2015-04-28 DIAGNOSIS — N390 Urinary tract infection, site not specified: Secondary | ICD-10-CM | POA: Diagnosis not present

## 2015-04-28 DIAGNOSIS — E119 Type 2 diabetes mellitus without complications: Secondary | ICD-10-CM | POA: Diagnosis not present

## 2015-04-29 DIAGNOSIS — I10 Essential (primary) hypertension: Secondary | ICD-10-CM | POA: Diagnosis not present

## 2015-04-29 DIAGNOSIS — A419 Sepsis, unspecified organism: Secondary | ICD-10-CM | POA: Diagnosis not present

## 2015-04-29 DIAGNOSIS — N390 Urinary tract infection, site not specified: Secondary | ICD-10-CM | POA: Diagnosis not present

## 2015-04-29 DIAGNOSIS — F0391 Unspecified dementia with behavioral disturbance: Secondary | ICD-10-CM | POA: Diagnosis not present

## 2015-04-29 DIAGNOSIS — E785 Hyperlipidemia, unspecified: Secondary | ICD-10-CM | POA: Diagnosis not present

## 2015-04-29 DIAGNOSIS — E119 Type 2 diabetes mellitus without complications: Secondary | ICD-10-CM | POA: Diagnosis not present

## 2015-05-04 DIAGNOSIS — E119 Type 2 diabetes mellitus without complications: Secondary | ICD-10-CM | POA: Diagnosis not present

## 2015-05-04 DIAGNOSIS — E785 Hyperlipidemia, unspecified: Secondary | ICD-10-CM | POA: Diagnosis not present

## 2015-05-04 DIAGNOSIS — R4182 Altered mental status, unspecified: Secondary | ICD-10-CM | POA: Diagnosis not present

## 2015-05-04 DIAGNOSIS — N390 Urinary tract infection, site not specified: Secondary | ICD-10-CM | POA: Diagnosis not present

## 2015-05-04 DIAGNOSIS — I10 Essential (primary) hypertension: Secondary | ICD-10-CM | POA: Diagnosis not present

## 2015-05-04 DIAGNOSIS — N3000 Acute cystitis without hematuria: Secondary | ICD-10-CM | POA: Diagnosis not present

## 2015-05-04 DIAGNOSIS — F0391 Unspecified dementia with behavioral disturbance: Secondary | ICD-10-CM | POA: Diagnosis not present

## 2015-05-04 DIAGNOSIS — A419 Sepsis, unspecified organism: Secondary | ICD-10-CM | POA: Diagnosis not present

## 2015-05-04 DIAGNOSIS — J111 Influenza due to unidentified influenza virus with other respiratory manifestations: Secondary | ICD-10-CM | POA: Diagnosis not present

## 2015-05-04 DIAGNOSIS — R509 Fever, unspecified: Secondary | ICD-10-CM | POA: Diagnosis not present

## 2015-05-08 DIAGNOSIS — F0391 Unspecified dementia with behavioral disturbance: Secondary | ICD-10-CM | POA: Diagnosis not present

## 2015-05-08 DIAGNOSIS — I10 Essential (primary) hypertension: Secondary | ICD-10-CM | POA: Diagnosis not present

## 2015-05-08 DIAGNOSIS — E785 Hyperlipidemia, unspecified: Secondary | ICD-10-CM | POA: Diagnosis not present

## 2015-05-08 DIAGNOSIS — N390 Urinary tract infection, site not specified: Secondary | ICD-10-CM | POA: Diagnosis not present

## 2015-05-08 DIAGNOSIS — A419 Sepsis, unspecified organism: Secondary | ICD-10-CM | POA: Diagnosis not present

## 2015-05-08 DIAGNOSIS — E119 Type 2 diabetes mellitus without complications: Secondary | ICD-10-CM | POA: Diagnosis not present

## 2015-05-10 DIAGNOSIS — N390 Urinary tract infection, site not specified: Secondary | ICD-10-CM | POA: Diagnosis not present

## 2015-05-10 DIAGNOSIS — F0391 Unspecified dementia with behavioral disturbance: Secondary | ICD-10-CM | POA: Diagnosis not present

## 2015-05-10 DIAGNOSIS — I10 Essential (primary) hypertension: Secondary | ICD-10-CM | POA: Diagnosis not present

## 2015-05-10 DIAGNOSIS — E119 Type 2 diabetes mellitus without complications: Secondary | ICD-10-CM | POA: Diagnosis not present

## 2015-05-10 DIAGNOSIS — A419 Sepsis, unspecified organism: Secondary | ICD-10-CM | POA: Diagnosis not present

## 2015-05-10 DIAGNOSIS — E785 Hyperlipidemia, unspecified: Secondary | ICD-10-CM | POA: Diagnosis not present

## 2015-05-12 DIAGNOSIS — E785 Hyperlipidemia, unspecified: Secondary | ICD-10-CM | POA: Diagnosis not present

## 2015-05-12 DIAGNOSIS — I10 Essential (primary) hypertension: Secondary | ICD-10-CM | POA: Diagnosis not present

## 2015-05-12 DIAGNOSIS — E119 Type 2 diabetes mellitus without complications: Secondary | ICD-10-CM | POA: Diagnosis not present

## 2015-05-12 DIAGNOSIS — N390 Urinary tract infection, site not specified: Secondary | ICD-10-CM | POA: Diagnosis not present

## 2015-05-12 DIAGNOSIS — A419 Sepsis, unspecified organism: Secondary | ICD-10-CM | POA: Diagnosis not present

## 2015-05-12 DIAGNOSIS — F0391 Unspecified dementia with behavioral disturbance: Secondary | ICD-10-CM | POA: Diagnosis not present

## 2015-05-13 DIAGNOSIS — E785 Hyperlipidemia, unspecified: Secondary | ICD-10-CM | POA: Diagnosis not present

## 2015-05-13 DIAGNOSIS — E119 Type 2 diabetes mellitus without complications: Secondary | ICD-10-CM | POA: Diagnosis not present

## 2015-05-13 DIAGNOSIS — N390 Urinary tract infection, site not specified: Secondary | ICD-10-CM | POA: Diagnosis not present

## 2015-05-13 DIAGNOSIS — I10 Essential (primary) hypertension: Secondary | ICD-10-CM | POA: Diagnosis not present

## 2015-05-13 DIAGNOSIS — F0391 Unspecified dementia with behavioral disturbance: Secondary | ICD-10-CM | POA: Diagnosis not present

## 2015-05-13 DIAGNOSIS — A419 Sepsis, unspecified organism: Secondary | ICD-10-CM | POA: Diagnosis not present

## 2015-05-17 DIAGNOSIS — I10 Essential (primary) hypertension: Secondary | ICD-10-CM | POA: Diagnosis not present

## 2015-05-17 DIAGNOSIS — N390 Urinary tract infection, site not specified: Secondary | ICD-10-CM | POA: Diagnosis not present

## 2015-05-17 DIAGNOSIS — F0391 Unspecified dementia with behavioral disturbance: Secondary | ICD-10-CM | POA: Diagnosis not present

## 2015-05-17 DIAGNOSIS — E119 Type 2 diabetes mellitus without complications: Secondary | ICD-10-CM | POA: Diagnosis not present

## 2015-05-17 DIAGNOSIS — E785 Hyperlipidemia, unspecified: Secondary | ICD-10-CM | POA: Diagnosis not present

## 2015-05-17 DIAGNOSIS — A419 Sepsis, unspecified organism: Secondary | ICD-10-CM | POA: Diagnosis not present

## 2015-05-19 DIAGNOSIS — I10 Essential (primary) hypertension: Secondary | ICD-10-CM | POA: Diagnosis not present

## 2015-05-19 DIAGNOSIS — E119 Type 2 diabetes mellitus without complications: Secondary | ICD-10-CM | POA: Diagnosis not present

## 2015-05-19 DIAGNOSIS — F0391 Unspecified dementia with behavioral disturbance: Secondary | ICD-10-CM | POA: Diagnosis not present

## 2015-05-19 DIAGNOSIS — N390 Urinary tract infection, site not specified: Secondary | ICD-10-CM | POA: Diagnosis not present

## 2015-05-19 DIAGNOSIS — E785 Hyperlipidemia, unspecified: Secondary | ICD-10-CM | POA: Diagnosis not present

## 2015-05-19 DIAGNOSIS — A419 Sepsis, unspecified organism: Secondary | ICD-10-CM | POA: Diagnosis not present

## 2015-05-24 DIAGNOSIS — E785 Hyperlipidemia, unspecified: Secondary | ICD-10-CM | POA: Diagnosis not present

## 2015-05-24 DIAGNOSIS — N390 Urinary tract infection, site not specified: Secondary | ICD-10-CM | POA: Diagnosis not present

## 2015-05-24 DIAGNOSIS — F0391 Unspecified dementia with behavioral disturbance: Secondary | ICD-10-CM | POA: Diagnosis not present

## 2015-05-24 DIAGNOSIS — A419 Sepsis, unspecified organism: Secondary | ICD-10-CM | POA: Diagnosis not present

## 2015-05-24 DIAGNOSIS — I10 Essential (primary) hypertension: Secondary | ICD-10-CM | POA: Diagnosis not present

## 2015-05-24 DIAGNOSIS — E119 Type 2 diabetes mellitus without complications: Secondary | ICD-10-CM | POA: Diagnosis not present

## 2015-05-26 DIAGNOSIS — E785 Hyperlipidemia, unspecified: Secondary | ICD-10-CM | POA: Diagnosis not present

## 2015-05-26 DIAGNOSIS — A419 Sepsis, unspecified organism: Secondary | ICD-10-CM | POA: Diagnosis not present

## 2015-05-26 DIAGNOSIS — E119 Type 2 diabetes mellitus without complications: Secondary | ICD-10-CM | POA: Diagnosis not present

## 2015-05-26 DIAGNOSIS — F0391 Unspecified dementia with behavioral disturbance: Secondary | ICD-10-CM | POA: Diagnosis not present

## 2015-05-26 DIAGNOSIS — I10 Essential (primary) hypertension: Secondary | ICD-10-CM | POA: Diagnosis not present

## 2015-05-26 DIAGNOSIS — N390 Urinary tract infection, site not specified: Secondary | ICD-10-CM | POA: Diagnosis not present

## 2015-05-31 DIAGNOSIS — I10 Essential (primary) hypertension: Secondary | ICD-10-CM | POA: Diagnosis not present

## 2015-05-31 DIAGNOSIS — E785 Hyperlipidemia, unspecified: Secondary | ICD-10-CM | POA: Diagnosis not present

## 2015-05-31 DIAGNOSIS — F0391 Unspecified dementia with behavioral disturbance: Secondary | ICD-10-CM | POA: Diagnosis not present

## 2015-05-31 DIAGNOSIS — A419 Sepsis, unspecified organism: Secondary | ICD-10-CM | POA: Diagnosis not present

## 2015-05-31 DIAGNOSIS — E119 Type 2 diabetes mellitus without complications: Secondary | ICD-10-CM | POA: Diagnosis not present

## 2015-05-31 DIAGNOSIS — N390 Urinary tract infection, site not specified: Secondary | ICD-10-CM | POA: Diagnosis not present

## 2015-06-02 DIAGNOSIS — E119 Type 2 diabetes mellitus without complications: Secondary | ICD-10-CM | POA: Diagnosis not present

## 2015-06-02 DIAGNOSIS — A419 Sepsis, unspecified organism: Secondary | ICD-10-CM | POA: Diagnosis not present

## 2015-06-02 DIAGNOSIS — N390 Urinary tract infection, site not specified: Secondary | ICD-10-CM | POA: Diagnosis not present

## 2015-06-02 DIAGNOSIS — I10 Essential (primary) hypertension: Secondary | ICD-10-CM | POA: Diagnosis not present

## 2015-06-02 DIAGNOSIS — F0391 Unspecified dementia with behavioral disturbance: Secondary | ICD-10-CM | POA: Diagnosis not present

## 2015-06-02 DIAGNOSIS — E785 Hyperlipidemia, unspecified: Secondary | ICD-10-CM | POA: Diagnosis not present

## 2015-06-07 DIAGNOSIS — F0391 Unspecified dementia with behavioral disturbance: Secondary | ICD-10-CM | POA: Diagnosis not present

## 2015-06-07 DIAGNOSIS — N390 Urinary tract infection, site not specified: Secondary | ICD-10-CM | POA: Diagnosis not present

## 2015-06-07 DIAGNOSIS — I10 Essential (primary) hypertension: Secondary | ICD-10-CM | POA: Diagnosis not present

## 2015-06-07 DIAGNOSIS — E785 Hyperlipidemia, unspecified: Secondary | ICD-10-CM | POA: Diagnosis not present

## 2015-06-07 DIAGNOSIS — A419 Sepsis, unspecified organism: Secondary | ICD-10-CM | POA: Diagnosis not present

## 2015-06-07 DIAGNOSIS — E119 Type 2 diabetes mellitus without complications: Secondary | ICD-10-CM | POA: Diagnosis not present

## 2015-06-08 DIAGNOSIS — Z7984 Long term (current) use of oral hypoglycemic drugs: Secondary | ICD-10-CM | POA: Diagnosis not present

## 2015-06-08 DIAGNOSIS — F0391 Unspecified dementia with behavioral disturbance: Secondary | ICD-10-CM | POA: Diagnosis not present

## 2015-06-08 DIAGNOSIS — W19XXXD Unspecified fall, subsequent encounter: Secondary | ICD-10-CM | POA: Diagnosis not present

## 2015-06-08 DIAGNOSIS — E119 Type 2 diabetes mellitus without complications: Secondary | ICD-10-CM | POA: Diagnosis not present

## 2015-06-08 DIAGNOSIS — I10 Essential (primary) hypertension: Secondary | ICD-10-CM | POA: Diagnosis not present

## 2015-06-08 DIAGNOSIS — Z87440 Personal history of urinary (tract) infections: Secondary | ICD-10-CM | POA: Diagnosis not present

## 2015-06-08 DIAGNOSIS — R296 Repeated falls: Secondary | ICD-10-CM | POA: Diagnosis not present

## 2015-06-08 DIAGNOSIS — E785 Hyperlipidemia, unspecified: Secondary | ICD-10-CM | POA: Diagnosis not present

## 2015-06-09 DIAGNOSIS — W19XXXD Unspecified fall, subsequent encounter: Secondary | ICD-10-CM | POA: Diagnosis not present

## 2015-06-09 DIAGNOSIS — E785 Hyperlipidemia, unspecified: Secondary | ICD-10-CM | POA: Diagnosis not present

## 2015-06-09 DIAGNOSIS — I10 Essential (primary) hypertension: Secondary | ICD-10-CM | POA: Diagnosis not present

## 2015-06-09 DIAGNOSIS — R296 Repeated falls: Secondary | ICD-10-CM | POA: Diagnosis not present

## 2015-06-09 DIAGNOSIS — F0391 Unspecified dementia with behavioral disturbance: Secondary | ICD-10-CM | POA: Diagnosis not present

## 2015-06-09 DIAGNOSIS — E119 Type 2 diabetes mellitus without complications: Secondary | ICD-10-CM | POA: Diagnosis not present

## 2015-06-15 DIAGNOSIS — E785 Hyperlipidemia, unspecified: Secondary | ICD-10-CM | POA: Diagnosis not present

## 2015-06-15 DIAGNOSIS — E119 Type 2 diabetes mellitus without complications: Secondary | ICD-10-CM | POA: Diagnosis not present

## 2015-06-15 DIAGNOSIS — W19XXXD Unspecified fall, subsequent encounter: Secondary | ICD-10-CM | POA: Diagnosis not present

## 2015-06-15 DIAGNOSIS — R296 Repeated falls: Secondary | ICD-10-CM | POA: Diagnosis not present

## 2015-06-15 DIAGNOSIS — F0391 Unspecified dementia with behavioral disturbance: Secondary | ICD-10-CM | POA: Diagnosis not present

## 2015-06-15 DIAGNOSIS — I10 Essential (primary) hypertension: Secondary | ICD-10-CM | POA: Diagnosis not present

## 2015-06-17 DIAGNOSIS — I10 Essential (primary) hypertension: Secondary | ICD-10-CM | POA: Diagnosis not present

## 2015-06-17 DIAGNOSIS — R296 Repeated falls: Secondary | ICD-10-CM | POA: Diagnosis not present

## 2015-06-17 DIAGNOSIS — E785 Hyperlipidemia, unspecified: Secondary | ICD-10-CM | POA: Diagnosis not present

## 2015-06-17 DIAGNOSIS — W19XXXD Unspecified fall, subsequent encounter: Secondary | ICD-10-CM | POA: Diagnosis not present

## 2015-06-17 DIAGNOSIS — F0391 Unspecified dementia with behavioral disturbance: Secondary | ICD-10-CM | POA: Diagnosis not present

## 2015-06-17 DIAGNOSIS — E119 Type 2 diabetes mellitus without complications: Secondary | ICD-10-CM | POA: Diagnosis not present

## 2015-06-21 DIAGNOSIS — R296 Repeated falls: Secondary | ICD-10-CM | POA: Diagnosis not present

## 2015-06-21 DIAGNOSIS — E785 Hyperlipidemia, unspecified: Secondary | ICD-10-CM | POA: Diagnosis not present

## 2015-06-21 DIAGNOSIS — F0391 Unspecified dementia with behavioral disturbance: Secondary | ICD-10-CM | POA: Diagnosis not present

## 2015-06-21 DIAGNOSIS — E119 Type 2 diabetes mellitus without complications: Secondary | ICD-10-CM | POA: Diagnosis not present

## 2015-06-21 DIAGNOSIS — I10 Essential (primary) hypertension: Secondary | ICD-10-CM | POA: Diagnosis not present

## 2015-06-21 DIAGNOSIS — W19XXXD Unspecified fall, subsequent encounter: Secondary | ICD-10-CM | POA: Diagnosis not present

## 2015-06-23 DIAGNOSIS — W19XXXD Unspecified fall, subsequent encounter: Secondary | ICD-10-CM | POA: Diagnosis not present

## 2015-06-23 DIAGNOSIS — I10 Essential (primary) hypertension: Secondary | ICD-10-CM | POA: Diagnosis not present

## 2015-06-23 DIAGNOSIS — F0391 Unspecified dementia with behavioral disturbance: Secondary | ICD-10-CM | POA: Diagnosis not present

## 2015-06-23 DIAGNOSIS — E785 Hyperlipidemia, unspecified: Secondary | ICD-10-CM | POA: Diagnosis not present

## 2015-06-23 DIAGNOSIS — E119 Type 2 diabetes mellitus without complications: Secondary | ICD-10-CM | POA: Diagnosis not present

## 2015-06-23 DIAGNOSIS — R296 Repeated falls: Secondary | ICD-10-CM | POA: Diagnosis not present

## 2015-06-30 DIAGNOSIS — F0391 Unspecified dementia with behavioral disturbance: Secondary | ICD-10-CM | POA: Diagnosis not present

## 2015-06-30 DIAGNOSIS — E785 Hyperlipidemia, unspecified: Secondary | ICD-10-CM | POA: Diagnosis not present

## 2015-06-30 DIAGNOSIS — E119 Type 2 diabetes mellitus without complications: Secondary | ICD-10-CM | POA: Diagnosis not present

## 2015-06-30 DIAGNOSIS — W19XXXD Unspecified fall, subsequent encounter: Secondary | ICD-10-CM | POA: Diagnosis not present

## 2015-06-30 DIAGNOSIS — R296 Repeated falls: Secondary | ICD-10-CM | POA: Diagnosis not present

## 2015-06-30 DIAGNOSIS — I10 Essential (primary) hypertension: Secondary | ICD-10-CM | POA: Diagnosis not present

## 2015-07-02 DIAGNOSIS — R296 Repeated falls: Secondary | ICD-10-CM | POA: Diagnosis not present

## 2015-07-02 DIAGNOSIS — E119 Type 2 diabetes mellitus without complications: Secondary | ICD-10-CM | POA: Diagnosis not present

## 2015-07-02 DIAGNOSIS — W19XXXD Unspecified fall, subsequent encounter: Secondary | ICD-10-CM | POA: Diagnosis not present

## 2015-07-02 DIAGNOSIS — E785 Hyperlipidemia, unspecified: Secondary | ICD-10-CM | POA: Diagnosis not present

## 2015-07-02 DIAGNOSIS — F0391 Unspecified dementia with behavioral disturbance: Secondary | ICD-10-CM | POA: Diagnosis not present

## 2015-07-02 DIAGNOSIS — I10 Essential (primary) hypertension: Secondary | ICD-10-CM | POA: Diagnosis not present

## 2015-07-05 DIAGNOSIS — R296 Repeated falls: Secondary | ICD-10-CM | POA: Diagnosis not present

## 2015-07-05 DIAGNOSIS — F0391 Unspecified dementia with behavioral disturbance: Secondary | ICD-10-CM | POA: Diagnosis not present

## 2015-07-05 DIAGNOSIS — W19XXXD Unspecified fall, subsequent encounter: Secondary | ICD-10-CM | POA: Diagnosis not present

## 2015-07-05 DIAGNOSIS — I10 Essential (primary) hypertension: Secondary | ICD-10-CM | POA: Diagnosis not present

## 2015-07-05 DIAGNOSIS — E119 Type 2 diabetes mellitus without complications: Secondary | ICD-10-CM | POA: Diagnosis not present

## 2015-07-05 DIAGNOSIS — E785 Hyperlipidemia, unspecified: Secondary | ICD-10-CM | POA: Diagnosis not present

## 2015-07-07 DIAGNOSIS — E119 Type 2 diabetes mellitus without complications: Secondary | ICD-10-CM | POA: Diagnosis not present

## 2015-07-07 DIAGNOSIS — I10 Essential (primary) hypertension: Secondary | ICD-10-CM | POA: Diagnosis not present

## 2015-07-07 DIAGNOSIS — W19XXXD Unspecified fall, subsequent encounter: Secondary | ICD-10-CM | POA: Diagnosis not present

## 2015-07-07 DIAGNOSIS — R296 Repeated falls: Secondary | ICD-10-CM | POA: Diagnosis not present

## 2015-07-07 DIAGNOSIS — F0391 Unspecified dementia with behavioral disturbance: Secondary | ICD-10-CM | POA: Diagnosis not present

## 2015-07-07 DIAGNOSIS — E785 Hyperlipidemia, unspecified: Secondary | ICD-10-CM | POA: Diagnosis not present

## 2015-07-13 DIAGNOSIS — W19XXXD Unspecified fall, subsequent encounter: Secondary | ICD-10-CM | POA: Diagnosis not present

## 2015-07-13 DIAGNOSIS — E785 Hyperlipidemia, unspecified: Secondary | ICD-10-CM | POA: Diagnosis not present

## 2015-07-13 DIAGNOSIS — F0391 Unspecified dementia with behavioral disturbance: Secondary | ICD-10-CM | POA: Diagnosis not present

## 2015-07-13 DIAGNOSIS — I10 Essential (primary) hypertension: Secondary | ICD-10-CM | POA: Diagnosis not present

## 2015-07-13 DIAGNOSIS — R296 Repeated falls: Secondary | ICD-10-CM | POA: Diagnosis not present

## 2015-07-13 DIAGNOSIS — E119 Type 2 diabetes mellitus without complications: Secondary | ICD-10-CM | POA: Diagnosis not present

## 2015-07-16 DIAGNOSIS — I10 Essential (primary) hypertension: Secondary | ICD-10-CM | POA: Diagnosis not present

## 2015-07-16 DIAGNOSIS — F0391 Unspecified dementia with behavioral disturbance: Secondary | ICD-10-CM | POA: Diagnosis not present

## 2015-07-16 DIAGNOSIS — E785 Hyperlipidemia, unspecified: Secondary | ICD-10-CM | POA: Diagnosis not present

## 2015-07-16 DIAGNOSIS — W19XXXD Unspecified fall, subsequent encounter: Secondary | ICD-10-CM | POA: Diagnosis not present

## 2015-07-16 DIAGNOSIS — E119 Type 2 diabetes mellitus without complications: Secondary | ICD-10-CM | POA: Diagnosis not present

## 2015-07-16 DIAGNOSIS — R296 Repeated falls: Secondary | ICD-10-CM | POA: Diagnosis not present

## 2015-07-19 DIAGNOSIS — F0391 Unspecified dementia with behavioral disturbance: Secondary | ICD-10-CM | POA: Diagnosis not present

## 2015-07-19 DIAGNOSIS — R296 Repeated falls: Secondary | ICD-10-CM | POA: Diagnosis not present

## 2015-07-19 DIAGNOSIS — E785 Hyperlipidemia, unspecified: Secondary | ICD-10-CM | POA: Diagnosis not present

## 2015-07-19 DIAGNOSIS — E119 Type 2 diabetes mellitus without complications: Secondary | ICD-10-CM | POA: Diagnosis not present

## 2015-07-19 DIAGNOSIS — I10 Essential (primary) hypertension: Secondary | ICD-10-CM | POA: Diagnosis not present

## 2015-07-19 DIAGNOSIS — W19XXXD Unspecified fall, subsequent encounter: Secondary | ICD-10-CM | POA: Diagnosis not present

## 2015-07-21 DIAGNOSIS — F0391 Unspecified dementia with behavioral disturbance: Secondary | ICD-10-CM | POA: Diagnosis not present

## 2015-07-21 DIAGNOSIS — E785 Hyperlipidemia, unspecified: Secondary | ICD-10-CM | POA: Diagnosis not present

## 2015-07-21 DIAGNOSIS — R296 Repeated falls: Secondary | ICD-10-CM | POA: Diagnosis not present

## 2015-07-21 DIAGNOSIS — E119 Type 2 diabetes mellitus without complications: Secondary | ICD-10-CM | POA: Diagnosis not present

## 2015-07-21 DIAGNOSIS — I10 Essential (primary) hypertension: Secondary | ICD-10-CM | POA: Diagnosis not present

## 2015-07-21 DIAGNOSIS — W19XXXD Unspecified fall, subsequent encounter: Secondary | ICD-10-CM | POA: Diagnosis not present

## 2015-07-22 DIAGNOSIS — R350 Frequency of micturition: Secondary | ICD-10-CM | POA: Diagnosis not present

## 2015-07-22 DIAGNOSIS — E1122 Type 2 diabetes mellitus with diabetic chronic kidney disease: Secondary | ICD-10-CM | POA: Diagnosis not present

## 2015-07-22 DIAGNOSIS — M869 Osteomyelitis, unspecified: Secondary | ICD-10-CM | POA: Diagnosis not present

## 2015-07-22 DIAGNOSIS — N183 Chronic kidney disease, stage 3 (moderate): Secondary | ICD-10-CM | POA: Diagnosis not present

## 2015-07-22 DIAGNOSIS — I129 Hypertensive chronic kidney disease with stage 1 through stage 4 chronic kidney disease, or unspecified chronic kidney disease: Secondary | ICD-10-CM | POA: Diagnosis not present

## 2015-07-22 DIAGNOSIS — F0390 Unspecified dementia without behavioral disturbance: Secondary | ICD-10-CM | POA: Diagnosis not present

## 2015-07-22 DIAGNOSIS — I700 Atherosclerosis of aorta: Secondary | ICD-10-CM | POA: Diagnosis not present

## 2015-07-22 DIAGNOSIS — D509 Iron deficiency anemia, unspecified: Secondary | ICD-10-CM | POA: Diagnosis not present

## 2015-07-22 DIAGNOSIS — E782 Mixed hyperlipidemia: Secondary | ICD-10-CM | POA: Diagnosis not present

## 2015-07-22 DIAGNOSIS — N3281 Overactive bladder: Secondary | ICD-10-CM | POA: Diagnosis not present

## 2015-07-22 DIAGNOSIS — M5137 Other intervertebral disc degeneration, lumbosacral region: Secondary | ICD-10-CM | POA: Diagnosis not present

## 2015-08-11 DIAGNOSIS — R350 Frequency of micturition: Secondary | ICD-10-CM | POA: Diagnosis not present

## 2015-09-09 DIAGNOSIS — R350 Frequency of micturition: Secondary | ICD-10-CM | POA: Diagnosis not present

## 2015-09-28 DIAGNOSIS — N390 Urinary tract infection, site not specified: Secondary | ICD-10-CM | POA: Diagnosis not present

## 2015-11-30 DIAGNOSIS — M1712 Unilateral primary osteoarthritis, left knee: Secondary | ICD-10-CM | POA: Diagnosis not present

## 2015-11-30 DIAGNOSIS — M170 Bilateral primary osteoarthritis of knee: Secondary | ICD-10-CM | POA: Diagnosis not present

## 2015-12-08 DIAGNOSIS — R8299 Other abnormal findings in urine: Secondary | ICD-10-CM | POA: Diagnosis not present

## 2015-12-08 DIAGNOSIS — Z0000 Encounter for general adult medical examination without abnormal findings: Secondary | ICD-10-CM | POA: Diagnosis not present

## 2015-12-08 DIAGNOSIS — I129 Hypertensive chronic kidney disease with stage 1 through stage 4 chronic kidney disease, or unspecified chronic kidney disease: Secondary | ICD-10-CM | POA: Diagnosis not present

## 2015-12-08 DIAGNOSIS — M869 Osteomyelitis, unspecified: Secondary | ICD-10-CM | POA: Diagnosis not present

## 2015-12-08 DIAGNOSIS — K219 Gastro-esophageal reflux disease without esophagitis: Secondary | ICD-10-CM | POA: Diagnosis not present

## 2015-12-08 DIAGNOSIS — D649 Anemia, unspecified: Secondary | ICD-10-CM | POA: Diagnosis not present

## 2015-12-08 DIAGNOSIS — I700 Atherosclerosis of aorta: Secondary | ICD-10-CM | POA: Diagnosis not present

## 2015-12-08 DIAGNOSIS — E1122 Type 2 diabetes mellitus with diabetic chronic kidney disease: Secondary | ICD-10-CM | POA: Diagnosis not present

## 2015-12-08 DIAGNOSIS — M8580 Other specified disorders of bone density and structure, unspecified site: Secondary | ICD-10-CM | POA: Diagnosis not present

## 2015-12-08 DIAGNOSIS — F0390 Unspecified dementia without behavioral disturbance: Secondary | ICD-10-CM | POA: Diagnosis not present

## 2015-12-08 DIAGNOSIS — N183 Chronic kidney disease, stage 3 (moderate): Secondary | ICD-10-CM | POA: Diagnosis not present

## 2015-12-08 DIAGNOSIS — E782 Mixed hyperlipidemia: Secondary | ICD-10-CM | POA: Diagnosis not present

## 2015-12-08 DIAGNOSIS — Z23 Encounter for immunization: Secondary | ICD-10-CM | POA: Diagnosis not present

## 2016-03-09 DIAGNOSIS — M1712 Unilateral primary osteoarthritis, left knee: Secondary | ICD-10-CM | POA: Diagnosis not present

## 2016-03-09 DIAGNOSIS — M1711 Unilateral primary osteoarthritis, right knee: Secondary | ICD-10-CM | POA: Diagnosis not present

## 2016-03-09 DIAGNOSIS — M170 Bilateral primary osteoarthritis of knee: Secondary | ICD-10-CM | POA: Diagnosis not present

## 2016-05-05 DIAGNOSIS — M170 Bilateral primary osteoarthritis of knee: Secondary | ICD-10-CM | POA: Diagnosis not present

## 2016-05-12 DIAGNOSIS — M545 Low back pain: Secondary | ICD-10-CM | POA: Diagnosis not present

## 2016-05-12 DIAGNOSIS — M1712 Unilateral primary osteoarthritis, left knee: Secondary | ICD-10-CM | POA: Diagnosis not present

## 2016-05-12 DIAGNOSIS — G8929 Other chronic pain: Secondary | ICD-10-CM | POA: Diagnosis not present

## 2016-05-12 DIAGNOSIS — M170 Bilateral primary osteoarthritis of knee: Secondary | ICD-10-CM | POA: Diagnosis not present

## 2016-05-12 DIAGNOSIS — M1711 Unilateral primary osteoarthritis, right knee: Secondary | ICD-10-CM | POA: Diagnosis not present

## 2016-05-17 DIAGNOSIS — R8290 Unspecified abnormal findings in urine: Secondary | ICD-10-CM | POA: Diagnosis not present

## 2016-05-17 DIAGNOSIS — R443 Hallucinations, unspecified: Secondary | ICD-10-CM | POA: Diagnosis not present

## 2016-05-19 DIAGNOSIS — M170 Bilateral primary osteoarthritis of knee: Secondary | ICD-10-CM | POA: Diagnosis not present

## 2016-07-19 DIAGNOSIS — M1711 Unilateral primary osteoarthritis, right knee: Secondary | ICD-10-CM | POA: Diagnosis not present

## 2016-07-19 DIAGNOSIS — M1712 Unilateral primary osteoarthritis, left knee: Secondary | ICD-10-CM | POA: Diagnosis not present

## 2016-08-04 DIAGNOSIS — R350 Frequency of micturition: Secondary | ICD-10-CM | POA: Diagnosis not present

## 2016-08-12 DIAGNOSIS — R410 Disorientation, unspecified: Secondary | ICD-10-CM | POA: Diagnosis not present

## 2016-08-12 DIAGNOSIS — M25569 Pain in unspecified knee: Secondary | ICD-10-CM | POA: Diagnosis not present

## 2016-08-12 DIAGNOSIS — R443 Hallucinations, unspecified: Secondary | ICD-10-CM | POA: Diagnosis not present

## 2016-08-12 DIAGNOSIS — G319 Degenerative disease of nervous system, unspecified: Secondary | ICD-10-CM | POA: Diagnosis not present

## 2016-08-12 DIAGNOSIS — R918 Other nonspecific abnormal finding of lung field: Secondary | ICD-10-CM | POA: Diagnosis not present

## 2016-08-12 DIAGNOSIS — G8929 Other chronic pain: Secondary | ICD-10-CM | POA: Diagnosis not present

## 2016-08-12 DIAGNOSIS — R930 Abnormal findings on diagnostic imaging of skull and head, not elsewhere classified: Secondary | ICD-10-CM | POA: Diagnosis not present

## 2016-08-12 DIAGNOSIS — Z79899 Other long term (current) drug therapy: Secondary | ICD-10-CM | POA: Diagnosis not present

## 2016-08-12 DIAGNOSIS — Z87891 Personal history of nicotine dependence: Secondary | ICD-10-CM | POA: Diagnosis not present

## 2016-08-12 DIAGNOSIS — Z7984 Long term (current) use of oral hypoglycemic drugs: Secondary | ICD-10-CM | POA: Diagnosis not present

## 2016-08-12 DIAGNOSIS — F0390 Unspecified dementia without behavioral disturbance: Secondary | ICD-10-CM | POA: Diagnosis not present

## 2016-08-12 DIAGNOSIS — R531 Weakness: Secondary | ICD-10-CM | POA: Diagnosis not present

## 2016-08-14 DIAGNOSIS — M170 Bilateral primary osteoarthritis of knee: Secondary | ICD-10-CM | POA: Diagnosis not present

## 2016-08-14 DIAGNOSIS — Z7984 Long term (current) use of oral hypoglycemic drugs: Secondary | ICD-10-CM | POA: Diagnosis not present

## 2016-08-14 DIAGNOSIS — G8929 Other chronic pain: Secondary | ICD-10-CM | POA: Diagnosis not present

## 2016-08-14 DIAGNOSIS — Z7902 Long term (current) use of antithrombotics/antiplatelets: Secondary | ICD-10-CM | POA: Diagnosis not present

## 2016-08-14 DIAGNOSIS — E119 Type 2 diabetes mellitus without complications: Secondary | ICD-10-CM | POA: Diagnosis not present

## 2016-08-14 DIAGNOSIS — K219 Gastro-esophageal reflux disease without esophagitis: Secondary | ICD-10-CM | POA: Diagnosis not present

## 2016-08-14 DIAGNOSIS — E7800 Pure hypercholesterolemia, unspecified: Secondary | ICD-10-CM | POA: Diagnosis not present

## 2016-08-14 DIAGNOSIS — M47816 Spondylosis without myelopathy or radiculopathy, lumbar region: Secondary | ICD-10-CM | POA: Diagnosis not present

## 2016-08-16 DIAGNOSIS — M47816 Spondylosis without myelopathy or radiculopathy, lumbar region: Secondary | ICD-10-CM | POA: Diagnosis not present

## 2016-08-16 DIAGNOSIS — G8929 Other chronic pain: Secondary | ICD-10-CM | POA: Diagnosis not present

## 2016-08-16 DIAGNOSIS — E7800 Pure hypercholesterolemia, unspecified: Secondary | ICD-10-CM | POA: Diagnosis not present

## 2016-08-16 DIAGNOSIS — K219 Gastro-esophageal reflux disease without esophagitis: Secondary | ICD-10-CM | POA: Diagnosis not present

## 2016-08-16 DIAGNOSIS — E119 Type 2 diabetes mellitus without complications: Secondary | ICD-10-CM | POA: Diagnosis not present

## 2016-08-16 DIAGNOSIS — M170 Bilateral primary osteoarthritis of knee: Secondary | ICD-10-CM | POA: Diagnosis not present

## 2016-08-22 DIAGNOSIS — E7800 Pure hypercholesterolemia, unspecified: Secondary | ICD-10-CM | POA: Diagnosis not present

## 2016-08-22 DIAGNOSIS — E119 Type 2 diabetes mellitus without complications: Secondary | ICD-10-CM | POA: Diagnosis not present

## 2016-08-22 DIAGNOSIS — K219 Gastro-esophageal reflux disease without esophagitis: Secondary | ICD-10-CM | POA: Diagnosis not present

## 2016-08-22 DIAGNOSIS — M170 Bilateral primary osteoarthritis of knee: Secondary | ICD-10-CM | POA: Diagnosis not present

## 2016-08-22 DIAGNOSIS — G8929 Other chronic pain: Secondary | ICD-10-CM | POA: Diagnosis not present

## 2016-08-22 DIAGNOSIS — M47816 Spondylosis without myelopathy or radiculopathy, lumbar region: Secondary | ICD-10-CM | POA: Diagnosis not present

## 2016-08-23 DIAGNOSIS — E119 Type 2 diabetes mellitus without complications: Secondary | ICD-10-CM | POA: Diagnosis not present

## 2016-08-23 DIAGNOSIS — M47816 Spondylosis without myelopathy or radiculopathy, lumbar region: Secondary | ICD-10-CM | POA: Diagnosis not present

## 2016-08-23 DIAGNOSIS — K219 Gastro-esophageal reflux disease without esophagitis: Secondary | ICD-10-CM | POA: Diagnosis not present

## 2016-08-23 DIAGNOSIS — G8929 Other chronic pain: Secondary | ICD-10-CM | POA: Diagnosis not present

## 2016-08-23 DIAGNOSIS — E7800 Pure hypercholesterolemia, unspecified: Secondary | ICD-10-CM | POA: Diagnosis not present

## 2016-08-23 DIAGNOSIS — M170 Bilateral primary osteoarthritis of knee: Secondary | ICD-10-CM | POA: Diagnosis not present

## 2016-08-28 DIAGNOSIS — R350 Frequency of micturition: Secondary | ICD-10-CM | POA: Diagnosis not present

## 2016-08-29 DIAGNOSIS — G8929 Other chronic pain: Secondary | ICD-10-CM | POA: Diagnosis not present

## 2016-08-29 DIAGNOSIS — M170 Bilateral primary osteoarthritis of knee: Secondary | ICD-10-CM | POA: Diagnosis not present

## 2016-08-29 DIAGNOSIS — M47816 Spondylosis without myelopathy or radiculopathy, lumbar region: Secondary | ICD-10-CM | POA: Diagnosis not present

## 2016-08-29 DIAGNOSIS — E119 Type 2 diabetes mellitus without complications: Secondary | ICD-10-CM | POA: Diagnosis not present

## 2016-08-29 DIAGNOSIS — E7800 Pure hypercholesterolemia, unspecified: Secondary | ICD-10-CM | POA: Diagnosis not present

## 2016-08-29 DIAGNOSIS — K219 Gastro-esophageal reflux disease without esophagitis: Secondary | ICD-10-CM | POA: Diagnosis not present

## 2016-09-01 DIAGNOSIS — K219 Gastro-esophageal reflux disease without esophagitis: Secondary | ICD-10-CM | POA: Diagnosis not present

## 2016-09-01 DIAGNOSIS — E119 Type 2 diabetes mellitus without complications: Secondary | ICD-10-CM | POA: Diagnosis not present

## 2016-09-01 DIAGNOSIS — M47816 Spondylosis without myelopathy or radiculopathy, lumbar region: Secondary | ICD-10-CM | POA: Diagnosis not present

## 2016-09-01 DIAGNOSIS — E7800 Pure hypercholesterolemia, unspecified: Secondary | ICD-10-CM | POA: Diagnosis not present

## 2016-09-01 DIAGNOSIS — G8929 Other chronic pain: Secondary | ICD-10-CM | POA: Diagnosis not present

## 2016-09-01 DIAGNOSIS — M170 Bilateral primary osteoarthritis of knee: Secondary | ICD-10-CM | POA: Diagnosis not present

## 2016-09-04 DIAGNOSIS — E119 Type 2 diabetes mellitus without complications: Secondary | ICD-10-CM | POA: Diagnosis not present

## 2016-09-04 DIAGNOSIS — G8929 Other chronic pain: Secondary | ICD-10-CM | POA: Diagnosis not present

## 2016-09-04 DIAGNOSIS — M170 Bilateral primary osteoarthritis of knee: Secondary | ICD-10-CM | POA: Diagnosis not present

## 2016-09-04 DIAGNOSIS — E7800 Pure hypercholesterolemia, unspecified: Secondary | ICD-10-CM | POA: Diagnosis not present

## 2016-09-04 DIAGNOSIS — M47816 Spondylosis without myelopathy or radiculopathy, lumbar region: Secondary | ICD-10-CM | POA: Diagnosis not present

## 2016-09-04 DIAGNOSIS — K219 Gastro-esophageal reflux disease without esophagitis: Secondary | ICD-10-CM | POA: Diagnosis not present

## 2016-09-08 DIAGNOSIS — K219 Gastro-esophageal reflux disease without esophagitis: Secondary | ICD-10-CM | POA: Diagnosis not present

## 2016-09-08 DIAGNOSIS — M170 Bilateral primary osteoarthritis of knee: Secondary | ICD-10-CM | POA: Diagnosis not present

## 2016-09-08 DIAGNOSIS — E119 Type 2 diabetes mellitus without complications: Secondary | ICD-10-CM | POA: Diagnosis not present

## 2016-09-08 DIAGNOSIS — M47816 Spondylosis without myelopathy or radiculopathy, lumbar region: Secondary | ICD-10-CM | POA: Diagnosis not present

## 2016-09-08 DIAGNOSIS — E7800 Pure hypercholesterolemia, unspecified: Secondary | ICD-10-CM | POA: Diagnosis not present

## 2016-09-08 DIAGNOSIS — G8929 Other chronic pain: Secondary | ICD-10-CM | POA: Diagnosis not present

## 2016-09-11 DIAGNOSIS — M47816 Spondylosis without myelopathy or radiculopathy, lumbar region: Secondary | ICD-10-CM | POA: Diagnosis not present

## 2016-09-11 DIAGNOSIS — E119 Type 2 diabetes mellitus without complications: Secondary | ICD-10-CM | POA: Diagnosis not present

## 2016-09-11 DIAGNOSIS — M170 Bilateral primary osteoarthritis of knee: Secondary | ICD-10-CM | POA: Diagnosis not present

## 2016-09-11 DIAGNOSIS — E7800 Pure hypercholesterolemia, unspecified: Secondary | ICD-10-CM | POA: Diagnosis not present

## 2016-09-11 DIAGNOSIS — G8929 Other chronic pain: Secondary | ICD-10-CM | POA: Diagnosis not present

## 2016-09-11 DIAGNOSIS — K219 Gastro-esophageal reflux disease without esophagitis: Secondary | ICD-10-CM | POA: Diagnosis not present

## 2016-09-13 DIAGNOSIS — K219 Gastro-esophageal reflux disease without esophagitis: Secondary | ICD-10-CM | POA: Diagnosis not present

## 2016-09-13 DIAGNOSIS — G8929 Other chronic pain: Secondary | ICD-10-CM | POA: Diagnosis not present

## 2016-09-13 DIAGNOSIS — E119 Type 2 diabetes mellitus without complications: Secondary | ICD-10-CM | POA: Diagnosis not present

## 2016-09-13 DIAGNOSIS — M47816 Spondylosis without myelopathy or radiculopathy, lumbar region: Secondary | ICD-10-CM | POA: Diagnosis not present

## 2016-09-13 DIAGNOSIS — M170 Bilateral primary osteoarthritis of knee: Secondary | ICD-10-CM | POA: Diagnosis not present

## 2016-09-13 DIAGNOSIS — E7800 Pure hypercholesterolemia, unspecified: Secondary | ICD-10-CM | POA: Diagnosis not present

## 2016-09-19 DIAGNOSIS — M170 Bilateral primary osteoarthritis of knee: Secondary | ICD-10-CM | POA: Diagnosis not present

## 2016-09-19 DIAGNOSIS — E119 Type 2 diabetes mellitus without complications: Secondary | ICD-10-CM | POA: Diagnosis not present

## 2016-09-19 DIAGNOSIS — G8929 Other chronic pain: Secondary | ICD-10-CM | POA: Diagnosis not present

## 2016-09-19 DIAGNOSIS — M47816 Spondylosis without myelopathy or radiculopathy, lumbar region: Secondary | ICD-10-CM | POA: Diagnosis not present

## 2016-09-19 DIAGNOSIS — K219 Gastro-esophageal reflux disease without esophagitis: Secondary | ICD-10-CM | POA: Diagnosis not present

## 2016-09-19 DIAGNOSIS — E7800 Pure hypercholesterolemia, unspecified: Secondary | ICD-10-CM | POA: Diagnosis not present

## 2016-09-20 DIAGNOSIS — M47816 Spondylosis without myelopathy or radiculopathy, lumbar region: Secondary | ICD-10-CM | POA: Diagnosis not present

## 2016-09-20 DIAGNOSIS — E119 Type 2 diabetes mellitus without complications: Secondary | ICD-10-CM | POA: Diagnosis not present

## 2016-09-20 DIAGNOSIS — G8929 Other chronic pain: Secondary | ICD-10-CM | POA: Diagnosis not present

## 2016-09-20 DIAGNOSIS — K219 Gastro-esophageal reflux disease without esophagitis: Secondary | ICD-10-CM | POA: Diagnosis not present

## 2016-09-20 DIAGNOSIS — M170 Bilateral primary osteoarthritis of knee: Secondary | ICD-10-CM | POA: Diagnosis not present

## 2016-09-20 DIAGNOSIS — E7800 Pure hypercholesterolemia, unspecified: Secondary | ICD-10-CM | POA: Diagnosis not present

## 2016-09-21 DIAGNOSIS — E7800 Pure hypercholesterolemia, unspecified: Secondary | ICD-10-CM | POA: Diagnosis not present

## 2016-09-21 DIAGNOSIS — M47816 Spondylosis without myelopathy or radiculopathy, lumbar region: Secondary | ICD-10-CM | POA: Diagnosis not present

## 2016-09-21 DIAGNOSIS — K219 Gastro-esophageal reflux disease without esophagitis: Secondary | ICD-10-CM | POA: Diagnosis not present

## 2016-09-21 DIAGNOSIS — M170 Bilateral primary osteoarthritis of knee: Secondary | ICD-10-CM | POA: Diagnosis not present

## 2016-09-21 DIAGNOSIS — G8929 Other chronic pain: Secondary | ICD-10-CM | POA: Diagnosis not present

## 2016-09-21 DIAGNOSIS — E119 Type 2 diabetes mellitus without complications: Secondary | ICD-10-CM | POA: Diagnosis not present

## 2016-09-25 DIAGNOSIS — M170 Bilateral primary osteoarthritis of knee: Secondary | ICD-10-CM | POA: Diagnosis not present

## 2016-09-25 DIAGNOSIS — E7800 Pure hypercholesterolemia, unspecified: Secondary | ICD-10-CM | POA: Diagnosis not present

## 2016-09-25 DIAGNOSIS — K219 Gastro-esophageal reflux disease without esophagitis: Secondary | ICD-10-CM | POA: Diagnosis not present

## 2016-09-25 DIAGNOSIS — M47816 Spondylosis without myelopathy or radiculopathy, lumbar region: Secondary | ICD-10-CM | POA: Diagnosis not present

## 2016-09-25 DIAGNOSIS — E119 Type 2 diabetes mellitus without complications: Secondary | ICD-10-CM | POA: Diagnosis not present

## 2016-09-25 DIAGNOSIS — G8929 Other chronic pain: Secondary | ICD-10-CM | POA: Diagnosis not present

## 2016-09-26 ENCOUNTER — Emergency Department (HOSPITAL_COMMUNITY): Payer: Medicare Other

## 2016-09-26 ENCOUNTER — Encounter (HOSPITAL_COMMUNITY)
Admission: EM | Disposition: A | Discharge status: Home / Self Care | Payer: Self-pay | Source: Home / Self Care | Attending: Emergency Medicine

## 2016-09-26 ENCOUNTER — Ambulatory Visit (HOSPITAL_COMMUNITY)
Admission: EM | Admit: 2016-09-26 | Discharge: 2016-09-27 | Disposition: A | Discharge status: Home / Self Care | Payer: Medicare Other | Attending: Internal Medicine | Admitting: Internal Medicine

## 2016-09-26 ENCOUNTER — Encounter (HOSPITAL_COMMUNITY): Payer: Self-pay | Admitting: Emergency Medicine

## 2016-09-26 DIAGNOSIS — K222 Esophageal obstruction: Secondary | ICD-10-CM

## 2016-09-26 DIAGNOSIS — T182XXA Foreign body in stomach, initial encounter: Secondary | ICD-10-CM | POA: Insufficient documentation

## 2016-09-26 DIAGNOSIS — R05 Cough: Secondary | ICD-10-CM | POA: Diagnosis not present

## 2016-09-26 DIAGNOSIS — Z7984 Long term (current) use of oral hypoglycemic drugs: Secondary | ICD-10-CM | POA: Insufficient documentation

## 2016-09-26 DIAGNOSIS — T18120A Food in esophagus causing compression of trachea, initial encounter: Secondary | ICD-10-CM | POA: Diagnosis not present

## 2016-09-26 DIAGNOSIS — X58XXXA Exposure to other specified factors, initial encounter: Secondary | ICD-10-CM | POA: Insufficient documentation

## 2016-09-26 DIAGNOSIS — I10 Essential (primary) hypertension: Secondary | ICD-10-CM | POA: Diagnosis not present

## 2016-09-26 DIAGNOSIS — M1990 Unspecified osteoarthritis, unspecified site: Secondary | ICD-10-CM | POA: Diagnosis not present

## 2016-09-26 DIAGNOSIS — Z79899 Other long term (current) drug therapy: Secondary | ICD-10-CM | POA: Insufficient documentation

## 2016-09-26 DIAGNOSIS — K219 Gastro-esophageal reflux disease without esophagitis: Secondary | ICD-10-CM | POA: Insufficient documentation

## 2016-09-26 DIAGNOSIS — D509 Iron deficiency anemia, unspecified: Secondary | ICD-10-CM | POA: Insufficient documentation

## 2016-09-26 DIAGNOSIS — J690 Pneumonitis due to inhalation of food and vomit: Secondary | ICD-10-CM

## 2016-09-26 DIAGNOSIS — K220 Achalasia of cardia: Secondary | ICD-10-CM | POA: Diagnosis not present

## 2016-09-26 DIAGNOSIS — E119 Type 2 diabetes mellitus without complications: Secondary | ICD-10-CM | POA: Insufficient documentation

## 2016-09-26 DIAGNOSIS — E785 Hyperlipidemia, unspecified: Secondary | ICD-10-CM | POA: Insufficient documentation

## 2016-09-26 DIAGNOSIS — K449 Diaphragmatic hernia without obstruction or gangrene: Secondary | ICD-10-CM | POA: Diagnosis not present

## 2016-09-26 DIAGNOSIS — F0390 Unspecified dementia without behavioral disturbance: Secondary | ICD-10-CM | POA: Diagnosis not present

## 2016-09-26 DIAGNOSIS — T18128A Food in esophagus causing other injury, initial encounter: Secondary | ICD-10-CM

## 2016-09-26 DIAGNOSIS — K228 Other specified diseases of esophagus: Secondary | ICD-10-CM | POA: Diagnosis not present

## 2016-09-26 DIAGNOSIS — R0602 Shortness of breath: Secondary | ICD-10-CM | POA: Diagnosis not present

## 2016-09-26 HISTORY — PX: ESOPHAGOGASTRODUODENOSCOPY: SHX5428

## 2016-09-26 SURGERY — EGD (ESOPHAGOGASTRODUODENOSCOPY)
Anesthesia: Moderate Sedation

## 2016-09-26 MED ORDER — LORAZEPAM 2 MG/ML IJ SOLN
0.5000 mg | Freq: Once | INTRAMUSCULAR | Status: AC
  Administered 2016-09-26: 0.5 mg via INTRAMUSCULAR
  Filled 2016-09-26: qty 1

## 2016-09-26 MED ORDER — GLUCAGON HCL RDNA (DIAGNOSTIC) 1 MG IJ SOLR
1.0000 mg | Freq: Once | INTRAMUSCULAR | Status: AC
  Administered 2016-09-26: 1 mg via INTRAVENOUS
  Filled 2016-09-26: qty 1

## 2016-09-26 MED ORDER — LEVOFLOXACIN 500 MG PO TABS: 500.0000 mg | ORAL_TABLET | Freq: Every day | ORAL | 0 refills | Status: DC

## 2016-09-26 MED ORDER — DIPHENHYDRAMINE HCL 50 MG/ML IJ SOLN
INTRAMUSCULAR | Status: AC
  Filled 2016-09-26: qty 1

## 2016-09-26 MED ORDER — SODIUM CHLORIDE 0.9 % IV SOLN
INTRAVENOUS | Status: DC
  Administered 2016-09-27: 03:00:00 via INTRAVENOUS

## 2016-09-26 MED ORDER — MIDAZOLAM HCL 5 MG/ML IJ SOLN
INTRAMUSCULAR | Status: AC
  Filled 2016-09-26: qty 2

## 2016-09-26 MED ORDER — LEVOFLOXACIN IN D5W 500 MG/100ML IV SOLN
500.0000 mg | Freq: Once | INTRAVENOUS | Status: AC
  Administered 2016-09-26: 500 mg via INTRAVENOUS
  Filled 2016-09-26: qty 100

## 2016-09-26 MED ORDER — FENTANYL CITRATE (PF) 100 MCG/2ML IJ SOLN
INTRAMUSCULAR | Status: AC
  Filled 2016-09-26: qty 2

## 2016-09-26 NOTE — H&P (Signed)
[facility] History and Physical   Primary Care Physician:  Lupita RaiderShaw, Kimberlee, MD   Reason for Procedure:   food impaction  Plan:    EGD and removal The risks and benefits as well as alternatives of endoscopic procedure(s) have been discussed and reviewed. All questions answered. The patient agrees to proceed.   HPI: Nicole Villa is a 83 y.o. female w/ intermittent dysphagia hx and ate chicken and watermelon at lunch and has not been able to swallow right since. No prior EGD.   Past Medical History:  Diagnosis Date  . Arthritis   . Diabetes mellitus without complication (HCC)   . Gallstones   . GERD (gastroesophageal reflux disease)   . Heart murmur   . Hyperlipemia   . Hypertension   . Microcytic anemia 05/07/2012    History reviewed. No pertinent surgical history.  Prior to Admission medications   Medication Sig Start Date End Date Taking? Authorizing Provider  acetaminophen (TYLENOL) 325 MG tablet Take 650 mg by mouth at bedtime.   Yes [provider]  amLODipine (NORVASC) 10 MG tablet Take 10 mg by mouth daily.   Yes [provider]  cholecalciferol (VITAMIN D) 1000 UNITS tablet Take 1,000 Units by mouth daily.   Yes [provider]  clopidogrel (PLAVIX) 75 MG tablet Take 1 tablet (75 mg total) by mouth daily with breakfast. 05/09/12  Yes Osvaldo ShipperKrishnan, Gokul, MD  fish oil-omega-3 fatty acids 1000 MG capsule Take 1 g by mouth daily.   Yes [provider]  glimepiride (AMARYL) 1 MG tablet Take 0.5 mg by mouth See admin instructions. Take a 1/2 tablet with the first meal of the day orally twice daily   Yes [provider]  HYDROcodone-acetaminophen (NORCO/VICODIN) 5-325 MG tablet Take 0.5 tablets by mouth 2 (two) times daily.   Yes [provider]  irbesartan (AVAPRO) 300 MG tablet Take 1 tablet (300 mg total) by mouth daily. 06/03/13  Yes Dellinger, Tora KindredMarianne L, PA-C  memantine (NAMENDA) 10 MG tablet Take 10 mg by  mouth 2 (two) times daily. 09/05/16  Yes [provider]  mirabegron ER (MYRBETRIQ) 50 MG TB24 tablet Take 50 mg by mouth at bedtime.   Yes [provider]  nabumetone (RELAFEN) 500 MG tablet Take 500 mg by mouth daily.   Yes [provider]  pantoprazole (PROTONIX) 40 MG tablet Take 40 mg by mouth daily.   Yes [provider]  pravastatin (PRAVACHOL) 20 MG tablet Take 20 mg by mouth daily.   Yes [provider]  trimethoprim (TRIMPEX) 100 MG tablet Take 100 mg by mouth daily. 09/05/16  Yes [provider]  cefUROXime (CEFTIN) 250 MG tablet Take 1 tablet (250 mg total) by mouth 2 (two) times daily with a meal. Patient not taking: Reported on 09/26/2016 04/07/15   Edsel PetrinMikhail, Maryann, DO  levofloxacin (LEVAQUIN) 500 MG tablet Take 1 tablet (500 mg total) by mouth daily. 09/26/16   Gerhard MunchLockwood, Robert, MD    Current Facility-Administered Medications  Medication Dose Route Frequency Provider Last Rate Last Dose  . 0.9 %  sodium chloride infusion   Intravenous Continuous Iva BoopGessner, Carl E, MD      . levofloxacin (LEVAQUIN) IVPB 500 mg  500 mg Intravenous Once Gerhard MunchLockwood, Robert, MD       Current Outpatient Prescriptions  Medication Sig Dispense Refill  . acetaminophen (TYLENOL) 325 MG tablet Take 650 mg by mouth at bedtime.    Marland Kitchen. amLODipine (NORVASC) 10 MG tablet Take 10 mg by  mouth daily.    . cholecalciferol (VITAMIN D) 1000 UNITS tablet Take 1,000 Units by mouth daily.    . clopidogrel (PLAVIX) 75 MG tablet Take 1 tablet (75 mg total) by mouth daily with breakfast. 30 tablet 1  . fish oil-omega-3 fatty acids 1000 MG capsule Take 1 g by mouth daily.    Marland Kitchen. glimepiride (AMARYL) 1 MG tablet Take 0.5 mg by mouth See admin instructions. Take a 1/2 tablet with the first meal of the day orally twice daily    . HYDROcodone-acetaminophen (NORCO/VICODIN) 5-325 MG tablet Take 0.5 tablets by mouth 2 (two) times daily.    . irbesartan (AVAPRO) 300 MG tablet Take 1 tablet  (300 mg total) by mouth daily. 30 tablet 6  . memantine (NAMENDA) 10 MG tablet Take 10 mg by mouth 2 (two) times daily.  3  . mirabegron ER (MYRBETRIQ) 50 MG TB24 tablet Take 50 mg by mouth at bedtime.    . nabumetone (RELAFEN) 500 MG tablet Take 500 mg by mouth daily.    . pantoprazole (PROTONIX) 40 MG tablet Take 40 mg by mouth daily.    . pravastatin (PRAVACHOL) 20 MG tablet Take 20 mg by mouth daily.    Marland Kitchen. trimethoprim (TRIMPEX) 100 MG tablet Take 100 mg by mouth daily.  3  . cefUROXime (CEFTIN) 250 MG tablet Take 1 tablet (250 mg total) by mouth 2 (two) times daily with a meal. (Patient not taking: Reported on 09/26/2016) 8 tablet 0  . levofloxacin (LEVAQUIN) 500 MG tablet Take 1 tablet (500 mg total) by mouth daily. 7 tablet 0    Allergies as of 09/26/2016 - Review Complete 09/26/2016  Allergen Reaction Noted  . Lisinopril Cough 05/07/2012    History reviewed. No pertinent family history.  Social History   Social History  . Marital status: Married    Spouse name: N/A  . Number of children: N/A  . Years of education: N/A   Occupational History  . Not on file.   Social History Main Topics  . Smoking status: Never Smoker  . Smokeless tobacco: Never Used  . Alcohol use No  . Drug use: No  . Sexual activity: Not on file   Other Topics Concern  . Not on file   Social History Narrative  . No narrative on file    Review of Systems: + dementia All other review of systems negative except as mentioned in the HPI.  Physical Exam: Vital signs in last 24 hours: Temp:  [97.8 F (36.6 C)] 97.8 F (36.6 C) (07/31 2053) Pulse Rate:  [108-118] 111 (07/31 2350) Resp:  [18-22] 22 (07/31 2350) BP: (156-197)/(65-83) 156/67 (07/31 2350) SpO2:  [85 %-96 %] 89 % (07/31 2350) Weight:  [130 lb (59 kg)] 130 lb (59 kg) (07/31 1913)   General:   Alert,  Well-developed, well-nourished, pleasant and cooperative in NAD Lungs:  Clear throughout to auscultation.   Heart:  Regular rate and  rhythm; no murmurs, clicks, rubs,  or gallops. Abdomen:  Soft, nontender and nondistended. Normal bowel sounds.   Neuro/Psych:  Alert and cooperative.   @Carl  Sena SlateE. Gessner, MD, Antionette FairyFACG [facility] [phone] (pager) 09/26/2016 11:54 PM@

## 2016-09-26 NOTE — ED Notes (Signed)
Pt was unable to tolerate saliva and vomited after stroke swallow screen was performed.

## 2016-09-26 NOTE — ED Triage Notes (Signed)
Pt choked on meal has been having cough since 1300  Today, occasional SOB and occasion product with cough

## 2016-09-26 NOTE — ED Notes (Signed)
Bed: [id number] Expected date:  Expected time:  Means of arrival:  Comments: EMS vomiting/aspiration/dementia

## 2016-09-26 NOTE — ED Provider Notes (Signed)
[facility]-EMERGENCY DEPT Provider Note   CSN: [id number] Arrival date & time: 09/26/16  1859     History   Chief Complaint Chief Complaint  Patient presents with  . Aspiration    HPI BRIGITT Villa is a 83 y.o. female.  HPI  Patient presents after an episode of aspiration or choking. Patient has dementia, was complicated the history of present illness. Level V caveat. Patient is here with her daughter and a grandson, neither of whom was with the patient during the episode that occurred 6 hours ago. Per report the patient was with a caregiver, and had an unusual episode, either choking or aspiration of lunch. Patient received the Heimlich maneuver, without substantial production of food. Since that time the patient has complained of some discomfort with swallowing, as well as ongoing production of clear sputum. She denies other chest pain, other abdominal pain, lightheadedness, other pain. Family states the patient was in her usual state of health prior to these events.  Past Medical History:  Diagnosis Date  . Arthritis   . Diabetes mellitus without complication (HCC)   . Gallstones   . GERD (gastroesophageal reflux disease)   . Heart murmur   . Hyperlipemia   . Hypertension   . Microcytic anemia 05/07/2012    Patient Active Problem List   Diagnosis Date Noted  . Sepsis (HCC) 04/05/2015  . UTI (lower urinary tract infection) 04/05/2015  . Acute encephalopathy 04/05/2015  . AKI (acute kidney injury) (HCC) 04/05/2015  . Delirium   . Acute biliary pancreatitis 05/31/2013  . Cholelithiasis 05/31/2013  . Disturbance of skin sensation 08/05/2012  . Presenile dementia, uncomplicated 08/05/2012  . Hypertension 05/07/2012  . Diabetes mellitus (HCC) 05/07/2012  . ARF (acute renal failure) (HCC) 05/07/2012  . Microcytic anemia 05/07/2012  . TIA (transient ischemic attack) 05/07/2012    History reviewed. No pertinent surgical history.  OB History    No data  available       Home Medications    Prior to Admission medications   Medication Sig Start Date End Date Taking? Authorizing Provider  acetaminophen (TYLENOL) 325 MG tablet Take 650 mg by mouth at bedtime.    [provider]  amLODipine (NORVASC) 10 MG tablet Take 10 mg by mouth daily.    [provider]  beta carotene w/minerals (OCUVITE) tablet Take 1 tablet by mouth daily.    [provider]  cefUROXime (CEFTIN) 250 MG tablet Take 1 tablet (250 mg total) by mouth 2 (two) times daily with a meal. 04/07/15   Edsel Petrin, DO  cholecalciferol (VITAMIN D) 1000 UNITS tablet Take 1,000 Units by mouth daily.    [provider]  clopidogrel (PLAVIX) 75 MG tablet Take 1 tablet (75 mg total) by mouth daily with breakfast. 05/09/12   Osvaldo Shipper, MD  fish oil-omega-3 fatty acids 1000 MG capsule Take 1 g by mouth daily.    [provider]  glimepiride (AMARYL) 1 MG tablet Take 0.5 mg by mouth See admin instructions. Take a 1/2 tablet with the first meal of the day orally twice daily    [provider]  HYDROcodone-acetaminophen (NORCO/VICODIN) 5-325 MG tablet Take 0.5 tablets by mouth 2 (two) times daily.    [provider]  irbesartan (AVAPRO) 300 MG tablet Take 1 tablet (300 mg total) by mouth daily. 06/03/13   Dellinger, Tora Kindred, PA-C  nabumetone (RELAFEN) 500 MG tablet Take 500 mg by mouth daily.    [provider]  pantoprazole (PROTONIX) 40  MG tablet Take 40 mg by mouth daily.    [provider]  pravastatin (PRAVACHOL) 20 MG tablet Take 20 mg by mouth daily.    [provider]    Family History No family history on file.  Social History Social History  Substance Use Topics  . Smoking status: Never Smoker  . Smokeless tobacco: Never Used  . Alcohol use No     Allergies   Lisinopril   Review of Systems Review of Systems  Unable to perform ROS: Dementia     Physical Exam Updated  Vital Signs BP (!) 183/74 (BP Location: Right Arm)   Pulse (!) 108   Temp 97.8 F (36.6 C) (Oral)   Resp 19   Wt 59 kg (130 lb)   SpO2 93%   BMI 26.26 kg/m   Physical Exam  Constitutional: She is oriented to person, place, and time. She appears well-developed and well-nourished. No distress.  HENT:  Head: Normocephalic and atraumatic.  Eyes: Conjunctivae and EOM are normal.  Cardiovascular: Normal rate and regular rhythm.   Murmur heard. Pulmonary/Chest: Effort normal and breath sounds normal. No stridor. No respiratory distress. She has no wheezes.  Abdominal: She exhibits no distension. There is no tenderness. There is no guarding.  Musculoskeletal: She exhibits no edema.  Neurological: She is alert and oriented to person, place, and time. No cranial nerve deficit.  Skin: Skin is warm and dry.  Psychiatric: She has a normal mood and affect.  Nursing note and vitals reviewed.    ED Treatments / Results   Radiology Dg Chest 2 View  Addendum Date: 09/26/2016   ADDENDUM REPORT: 09/26/2016 20:03 Electronically Signed   By: Jolaine ClickArthur  Hoss M.D.   On: 09/26/2016 20:03   Result Date: 09/26/2016 CLINICAL DATA:  Choked un lunch today.  Cough. EXAM: CHEST  2 VIEW COMPARISON:  None. FINDINGS: The heart is mildly enlarged. There is consolidation at the medial right lung base. Moderate-sized hiatal hernia is noted. Subsegmental atelectasis in the left mid lung. Low volumes. No pneumothorax or pleural effusion. IMPRESSION: Right basal consolidation is worrisome for aspiration pneumonia. Electronically Signed: By: Jolaine ClickArthur  Hoss M.D. On: 09/26/2016 20:00    Procedures Procedures (including critical care time)  Medications Ordered in ED Medications  glucagon (human recombinant) (GLUCAGEN) injection 1 mg (not administered)  LORazepam (ATIVAN) injection 0.5 mg (not administered)     Initial Impression / Assessment and Plan / ED Course  I have reviewed the triage vital signs and the nursing  notes.  Pertinent labs & imaging results that were available during my care of the patient were reviewed by me and considered in my medical decision making (see chart for details).  Update: Patient has attempted to swallow, with no ability to tolerate liquids, with immediate emesis. Patient received glucagon, Ativan.  Update: Patient has received glucagon, Ativan, and has failed an attempt to swallow liquids, with inability to drink water. I paged our GI colleagues for assistance with endoscopy.  I again discussed the x-ray finding with the family Patient denies any dyspnea, lung sounds are clear, and family.  She does not appear to have any increased work of breathing.   12:23 AM Patient back from the endoscopy lab, where a soft piece of food was found impacted at the gastroesophageal junction. I discussed patient's case with our gastroenterologist. Upon awakening the patient will be appropriate for discharge. Given the unclear history and her dementia, patient will receive prophylactic antibiotics with consideration of pneumonia as well.  Final Clinical Impressions(s) / ED Diagnoses  Esophageal foreign body   Gerhard MunchLockwood, Brant Peets, MD 09/27/16 [phone]

## 2016-09-27 DIAGNOSIS — M170 Bilateral primary osteoarthritis of knee: Secondary | ICD-10-CM | POA: Diagnosis not present

## 2016-09-27 DIAGNOSIS — K220 Achalasia of cardia: Secondary | ICD-10-CM

## 2016-09-27 DIAGNOSIS — K222 Esophageal obstruction: Secondary | ICD-10-CM

## 2016-09-27 DIAGNOSIS — K449 Diaphragmatic hernia without obstruction or gangrene: Secondary | ICD-10-CM

## 2016-09-27 DIAGNOSIS — K228 Other specified diseases of esophagus: Secondary | ICD-10-CM | POA: Diagnosis not present

## 2016-09-27 DIAGNOSIS — G8929 Other chronic pain: Secondary | ICD-10-CM | POA: Diagnosis not present

## 2016-09-27 DIAGNOSIS — M47816 Spondylosis without myelopathy or radiculopathy, lumbar region: Secondary | ICD-10-CM | POA: Diagnosis not present

## 2016-09-27 DIAGNOSIS — T18128A Food in esophagus causing other injury, initial encounter: Secondary | ICD-10-CM | POA: Diagnosis not present

## 2016-09-27 DIAGNOSIS — T182XXA Foreign body in stomach, initial encounter: Secondary | ICD-10-CM | POA: Diagnosis not present

## 2016-09-27 DIAGNOSIS — K219 Gastro-esophageal reflux disease without esophagitis: Secondary | ICD-10-CM | POA: Diagnosis not present

## 2016-09-27 DIAGNOSIS — E7800 Pure hypercholesterolemia, unspecified: Secondary | ICD-10-CM | POA: Diagnosis not present

## 2016-09-27 DIAGNOSIS — E119 Type 2 diabetes mellitus without complications: Secondary | ICD-10-CM | POA: Diagnosis not present

## 2016-09-27 MED ORDER — FENTANYL CITRATE (PF) 100 MCG/2ML IJ SOLN
INTRAMUSCULAR | Status: DC | PRN
  Administered 2016-09-26: 12.5 ug via INTRAVENOUS

## 2016-09-27 MED ORDER — CEFPODOXIME PROXETIL 100 MG PO TABS: 100.0000 mg | ORAL_TABLET | Freq: Two times a day (BID) | ORAL | 0 refills | Status: DC

## 2016-09-27 MED ORDER — MIDAZOLAM HCL 10 MG/2ML IJ SOLN
INTRAMUSCULAR | Status: DC | PRN
  Administered 2016-09-26: 1 mg via INTRAVENOUS

## 2016-09-27 NOTE — Op Note (Signed)
[facility] Patient Name: Nicole NajjarCatherine Villa Procedure Date: 09/26/2016 MRN: [id number] Attending MD: Iva Booparl E Rashawn Rolon , MD Date of Birth: 06/06/1922 CSN: [id number] Age: 83 Admit Type: Inpatient Procedure:                Upper GI endoscopy Indications:              Foreign body in the esophagus Providers:                Iva Booparl E. Shaterra Sanzone, MD, Harold BarbanLiz Honeycutt, RN, Jacqulyn LinerAlexis                            Powell, Technician Referring MD:              Medicines:                Midazolam 1 mg IV, Fentanyl 10 micrograms IV Complications:            No immediate complications. Estimated Blood Loss:     Estimated blood loss: none. Procedure:                Pre-Anesthesia Assessment:                           - Prior to the procedure, a History and Physical                            was performed, and patient medications and                            allergies were reviewed. The patient's tolerance of                            previous anesthesia was also reviewed. The risks                            and benefits of the procedure and the sedation                            options and risks were discussed with the patient.                            All questions were answered, and informed consent                            was obtained. Prior Anticoagulants: The patient                            last took Plavix (clopidogrel) on the day of the                            procedure. ASA Grade Assessment: III - A patient                            with severe systemic disease. After reviewing the  risks and benefits, the patient was deemed in                            satisfactory condition to undergo the procedure.                           After obtaining informed consent, the endoscope was                            passed under direct vision. Throughout the                            procedure, the patient's blood pressure, pulse, and          oxygen saturations were monitored continuously. The                            Endoscope was introduced through the mouth, and                            advanced to the antrum of the stomach. The upper GI                            endoscopy was accomplished without difficulty. The                            patient tolerated the procedure well. Scope In: Scope Out: Findings:      Food was found in the lower third of the esophagus. Removal of food was       accomplished. GENTLE PRESSURE ADVANCING SCOPE. Estimated blood loss:       none.      A non-obstructing Schatzki ring (acquired) was found at the       gastroesophageal junction.      Diffuse moderate erythema was found in the lower third of the esophagus.      A medium amount of food (residue) was found in the gastric body.      The exam was otherwise without abnormality.      The cardia and gastric fundus were normal on retroflexion. Impression:               - Food in the lower third of the esophagus. Removal                            was successful.                           - Non-obstructing Schatzki ring.                           - Erythema in the lower third of the esophagus.                           - A medium amount of food (residue) in the stomach.                           -  The examination was otherwise normal. Moderate Sedation:      Moderate (conscious) sedation was administered by the endoscopy nurse       and supervised by the endoscopist. The following parameters were       monitored: oxygen saturation, heart rate, blood pressure, respiratory       rate, EKG, adequacy of pulmonary ventilation, and response to care.       Total physician intraservice time was 10 minutes. Recommendation:           - Discharge patient to home (ambulatory).                           - Patient has a contact number available for                            emergencies. The signs and symptoms of potential                             delayed complications were discussed with the                            patient. Return to normal activities tomorrow.                            Written discharge instructions were provided to the                            patient.                           - [id number].                           - Stay on PPI                           We will call from office and schedule f/u visit                            later in August - at 89 w/ dementia and patent ring                            not sure esophageal dilation would be helpful                           - Continue present medications. Procedure Code(s):        --- Professional ---                           [phone], Esophagoscopy, flexible, transoral; with                            removal of foreign body(s)                           G0500, Moderate sedation services provided by the  same physician or other qualified health care                            professional performing a gastrointestinal                            endoscopic service that sedation supports,                            requiring the presence of an independent trained                            observer to assist in the monitoring of the                            patient's level of consciousness and physiological                            status; initial 15 minutes of intra-service time;                            patient age 81 years or older (additional time may                            be reported with [id number], as appropriate) Diagnosis Code(s):        --- Professional ---                           [id number], Food in esophagus causing other injury,                            initial encounter                           K22.2, Esophageal obstruction                           K22.8, Other specified diseases of esophagus                           T18.2XXA, Foreign body in stomach, initial encounter                            T18.108A, Unspecified foreign body in esophagus                            causing other injury, initial encounter CPT copyright 2016 [employer]. All rights reserved. The codes documented in this report are preliminary and upon coder review may  be revised to meet current compliance requirements. Iva Booparl E Eun Vermeer, MD 09/27/2016 2:04:21 PM This report has been signed electronically. Number of Addenda: 0

## 2016-09-27 NOTE — Brief Op Note (Signed)
09/26/2016  12:09 AM  PATIENT:  Nicole Villa  83 y.o. female  PRE-OPERATIVE DIAGNOSIS:  food impaction of esophagus  POST-OPERATIVE DIAGNOSIS: same  PROCEDURE:  Procedure(s): ESOPHAGOGASTRODUODENOSCOPY (EGD) (N/A)  SURGEON:  Surgeon(s) and Role:    * Iva BoopGessner, Hansika Leaming E, MD - Primary   ANESTHESIA: Fentanyl 25 ug IV and Versed 1 mg IV  Food and fluid seen in dilated esophagus w/ soft food impaction at GE junction - passed into stomach with gentle scope pressure - revealing erythema and submucosal heme in esophagus and a ring at GE junction + large hiatal hernia.  Stomach with fluid and food grossly NL. Duodenum not entered.   Plan: stay on PPI Will; arrange office f/u Dysphagia 3 diet  ? Needs a dilation vs just diet modification

## 2016-09-27 NOTE — Discharge Instructions (Addendum)
As discussed, you have been diagnosed with an esophageal food bolus impaction, and there is concern for possible early pneumonia as well. Please take all medication as directed and be sure to follow-up with your physicians.  Return here for concerning changes.  Food removed from esophagus and pushed into stomach.  YOU HAD AN ENDOSCOPIC PROCEDURE TODAY: Refer to the procedure report and other information in the discharge instructions given to you for any specific questions about what was found during the examination. If this information does not answer your questions, please call Dr. Marvell FullerGessner's office at [phone] to clarify.   YOU SHOULD EXPECT: Some feelings of bloating in the abdomen. Passage of more gas than usual. Walking can help get rid of the air that was put into your GI tract during the procedure and reduce the bloating. If you had a lower endoscopy (such as a colonoscopy or flexible sigmoidoscopy) you may notice spotting of blood in your stool or on the toilet paper. Some abdominal soreness may be present for a day or two, also.  DIET: Liquids only until 0800 then Dysphagia 3 diet - see attached  ACTIVITY: Your care partner should take you home directly after the procedure. You should plan to take it easy, moving slowly for the rest of the day. You can resume normal activity the day after the procedure however YOU SHOULD NOT DRIVE, use power tools, machinery or perform tasks that involve climbing or major physical exertion for 24 hours (because of the sedation medicines used during the test).   SYMPTOMS TO REPORT IMMEDIATELY: A gastroenterologist can be reached at any hour. Please call [phone]  for any of the following symptoms:  Following lower endoscopy (colonoscopy, flexible sigmoidoscopy) Excessive amounts of blood in the stool  Significant tenderness, worsening of abdominal pains  Swelling of the abdomen that is new, acute  Fever of 100 or higher  Following upper  endoscopy (EGD, EUS, ERCP, esophageal dilation) Vomiting of blood or coffee ground material  New, significant abdominal pain  New, significant chest pain or pain under the shoulder blades  Painful or persistently difficult swallowing  New shortness of breath  Black, tarry-looking or red, bloody stools  FOLLOW UP:  Dr. Marvell Fullergessner's office will contact you/family about a follow-up visit and possible repeat endoscopy.

## 2016-09-28 ENCOUNTER — Encounter (HOSPITAL_COMMUNITY): Payer: Self-pay | Admitting: Internal Medicine

## 2016-10-04 DIAGNOSIS — M170 Bilateral primary osteoarthritis of knee: Secondary | ICD-10-CM | POA: Diagnosis not present

## 2016-10-04 DIAGNOSIS — K219 Gastro-esophageal reflux disease without esophagitis: Secondary | ICD-10-CM | POA: Diagnosis not present

## 2016-10-04 DIAGNOSIS — G8929 Other chronic pain: Secondary | ICD-10-CM | POA: Diagnosis not present

## 2016-10-04 DIAGNOSIS — E119 Type 2 diabetes mellitus without complications: Secondary | ICD-10-CM | POA: Diagnosis not present

## 2016-10-04 DIAGNOSIS — E7800 Pure hypercholesterolemia, unspecified: Secondary | ICD-10-CM | POA: Diagnosis not present

## 2016-10-04 DIAGNOSIS — M47816 Spondylosis without myelopathy or radiculopathy, lumbar region: Secondary | ICD-10-CM | POA: Diagnosis not present

## 2016-10-05 DIAGNOSIS — M47816 Spondylosis without myelopathy or radiculopathy, lumbar region: Secondary | ICD-10-CM | POA: Diagnosis not present

## 2016-10-05 DIAGNOSIS — K219 Gastro-esophageal reflux disease without esophagitis: Secondary | ICD-10-CM | POA: Diagnosis not present

## 2016-10-05 DIAGNOSIS — E119 Type 2 diabetes mellitus without complications: Secondary | ICD-10-CM | POA: Diagnosis not present

## 2016-10-05 DIAGNOSIS — M170 Bilateral primary osteoarthritis of knee: Secondary | ICD-10-CM | POA: Diagnosis not present

## 2016-10-05 DIAGNOSIS — G8929 Other chronic pain: Secondary | ICD-10-CM | POA: Diagnosis not present

## 2016-10-05 DIAGNOSIS — E7800 Pure hypercholesterolemia, unspecified: Secondary | ICD-10-CM | POA: Diagnosis not present

## 2016-10-06 ENCOUNTER — Telehealth: Payer: Self-pay

## 2016-10-06 NOTE — Telephone Encounter (Signed)
Left detailed message on daughter Tina's mobile voicemail to call us and set up a September appointment with Dr Leone PayorGessner.

## 2016-10-06 NOTE — Telephone Encounter (Signed)
-----   Message from Iva Booparl E Gessner, MD sent at 10/05/2016  8:46 AM EDT ----- Regarding: needs appt Call daughter and get her a f/u me in September

## 2016-10-10 DIAGNOSIS — E7800 Pure hypercholesterolemia, unspecified: Secondary | ICD-10-CM | POA: Diagnosis not present

## 2016-10-10 DIAGNOSIS — M47816 Spondylosis without myelopathy or radiculopathy, lumbar region: Secondary | ICD-10-CM | POA: Diagnosis not present

## 2016-10-10 DIAGNOSIS — G8929 Other chronic pain: Secondary | ICD-10-CM | POA: Diagnosis not present

## 2016-10-10 DIAGNOSIS — E119 Type 2 diabetes mellitus without complications: Secondary | ICD-10-CM | POA: Diagnosis not present

## 2016-10-10 DIAGNOSIS — K219 Gastro-esophageal reflux disease without esophagitis: Secondary | ICD-10-CM | POA: Diagnosis not present

## 2016-10-10 DIAGNOSIS — M170 Bilateral primary osteoarthritis of knee: Secondary | ICD-10-CM | POA: Diagnosis not present

## 2016-10-11 ENCOUNTER — Encounter: Payer: Self-pay | Admitting: Internal Medicine

## 2016-10-11 NOTE — Telephone Encounter (Signed)
Is this okay Sir? 

## 2016-10-11 NOTE — Telephone Encounter (Signed)
Patient daughter Inetta Fermoina scheduled appointment. Next available is 12/08/16. Is that ok or does patient need to be seen sooner. Inetta Fermoina [phone]

## 2016-10-12 DIAGNOSIS — E7800 Pure hypercholesterolemia, unspecified: Secondary | ICD-10-CM | POA: Diagnosis not present

## 2016-10-12 DIAGNOSIS — M47816 Spondylosis without myelopathy or radiculopathy, lumbar region: Secondary | ICD-10-CM | POA: Diagnosis not present

## 2016-10-12 DIAGNOSIS — M170 Bilateral primary osteoarthritis of knee: Secondary | ICD-10-CM | POA: Diagnosis not present

## 2016-10-12 DIAGNOSIS — E119 Type 2 diabetes mellitus without complications: Secondary | ICD-10-CM | POA: Diagnosis not present

## 2016-10-12 DIAGNOSIS — K219 Gastro-esophageal reflux disease without esophagitis: Secondary | ICD-10-CM | POA: Diagnosis not present

## 2016-10-12 DIAGNOSIS — G8929 Other chronic pain: Secondary | ICD-10-CM | POA: Diagnosis not present

## 2016-10-12 NOTE — Telephone Encounter (Signed)
Per her daughter Nicole Villa her swallowing is doing fine.  She will call if that changes.

## 2016-10-12 NOTE — Telephone Encounter (Signed)
Ask daughter how the swallowing is going  If ok can wait til October  If not let me know

## 2016-11-24 DIAGNOSIS — N3000 Acute cystitis without hematuria: Secondary | ICD-10-CM | POA: Diagnosis not present

## 2016-12-02 DIAGNOSIS — M170 Bilateral primary osteoarthritis of knee: Secondary | ICD-10-CM | POA: Diagnosis not present

## 2016-12-02 DIAGNOSIS — M1712 Unilateral primary osteoarthritis, left knee: Secondary | ICD-10-CM | POA: Diagnosis not present

## 2016-12-02 DIAGNOSIS — M1711 Unilateral primary osteoarthritis, right knee: Secondary | ICD-10-CM | POA: Diagnosis not present

## 2016-12-05 DIAGNOSIS — Z23 Encounter for immunization: Secondary | ICD-10-CM | POA: Diagnosis not present

## 2016-12-08 ENCOUNTER — Ambulatory Visit: Payer: PRIVATE HEALTH INSURANCE | Admitting: Internal Medicine

## 2017-01-05 DIAGNOSIS — R4182 Altered mental status, unspecified: Secondary | ICD-10-CM | POA: Diagnosis not present

## 2017-01-30 DIAGNOSIS — N390 Urinary tract infection, site not specified: Secondary | ICD-10-CM | POA: Diagnosis not present

## 2017-03-08 DIAGNOSIS — Z0000 Encounter for general adult medical examination without abnormal findings: Secondary | ICD-10-CM | POA: Diagnosis not present

## 2017-03-08 DIAGNOSIS — M8580 Other specified disorders of bone density and structure, unspecified site: Secondary | ICD-10-CM | POA: Diagnosis not present

## 2017-03-08 DIAGNOSIS — K219 Gastro-esophageal reflux disease without esophagitis: Secondary | ICD-10-CM | POA: Diagnosis not present

## 2017-03-08 DIAGNOSIS — N3281 Overactive bladder: Secondary | ICD-10-CM | POA: Diagnosis not present

## 2017-03-08 DIAGNOSIS — E782 Mixed hyperlipidemia: Secondary | ICD-10-CM | POA: Diagnosis not present

## 2017-03-08 DIAGNOSIS — I700 Atherosclerosis of aorta: Secondary | ICD-10-CM | POA: Diagnosis not present

## 2017-03-08 DIAGNOSIS — R270 Ataxia, unspecified: Secondary | ICD-10-CM | POA: Diagnosis not present

## 2017-03-08 DIAGNOSIS — E1122 Type 2 diabetes mellitus with diabetic chronic kidney disease: Secondary | ICD-10-CM | POA: Diagnosis not present

## 2017-03-08 DIAGNOSIS — I129 Hypertensive chronic kidney disease with stage 1 through stage 4 chronic kidney disease, or unspecified chronic kidney disease: Secondary | ICD-10-CM | POA: Diagnosis not present

## 2017-03-08 DIAGNOSIS — R410 Disorientation, unspecified: Secondary | ICD-10-CM | POA: Diagnosis not present

## 2017-03-08 DIAGNOSIS — N183 Chronic kidney disease, stage 3 (moderate): Secondary | ICD-10-CM | POA: Diagnosis not present

## 2017-03-08 DIAGNOSIS — F0390 Unspecified dementia without behavioral disturbance: Secondary | ICD-10-CM | POA: Diagnosis not present

## 2017-03-22 DIAGNOSIS — R270 Ataxia, unspecified: Secondary | ICD-10-CM | POA: Diagnosis not present

## 2017-03-22 DIAGNOSIS — R2689 Other abnormalities of gait and mobility: Secondary | ICD-10-CM | POA: Diagnosis not present

## 2017-03-30 DIAGNOSIS — R270 Ataxia, unspecified: Secondary | ICD-10-CM | POA: Diagnosis not present

## 2017-03-30 DIAGNOSIS — R2689 Other abnormalities of gait and mobility: Secondary | ICD-10-CM | POA: Diagnosis not present

## 2017-04-02 DIAGNOSIS — R270 Ataxia, unspecified: Secondary | ICD-10-CM | POA: Diagnosis not present

## 2017-04-02 DIAGNOSIS — R2689 Other abnormalities of gait and mobility: Secondary | ICD-10-CM | POA: Diagnosis not present

## 2017-04-09 DIAGNOSIS — R2689 Other abnormalities of gait and mobility: Secondary | ICD-10-CM | POA: Diagnosis not present

## 2017-04-09 DIAGNOSIS — R270 Ataxia, unspecified: Secondary | ICD-10-CM | POA: Diagnosis not present

## 2017-04-16 DIAGNOSIS — R2689 Other abnormalities of gait and mobility: Secondary | ICD-10-CM | POA: Diagnosis not present

## 2017-04-16 DIAGNOSIS — R270 Ataxia, unspecified: Secondary | ICD-10-CM | POA: Diagnosis not present

## 2017-04-26 DIAGNOSIS — F0390 Unspecified dementia without behavioral disturbance: Secondary | ICD-10-CM | POA: Diagnosis not present

## 2017-04-26 DIAGNOSIS — W19XXXA Unspecified fall, initial encounter: Secondary | ICD-10-CM | POA: Diagnosis not present

## 2017-04-26 DIAGNOSIS — R54 Age-related physical debility: Secondary | ICD-10-CM | POA: Diagnosis not present

## 2017-04-26 DIAGNOSIS — R102 Pelvic and perineal pain: Secondary | ICD-10-CM | POA: Diagnosis not present

## 2017-05-08 DIAGNOSIS — M1711 Unilateral primary osteoarthritis, right knee: Secondary | ICD-10-CM | POA: Diagnosis not present

## 2017-05-08 DIAGNOSIS — M1712 Unilateral primary osteoarthritis, left knee: Secondary | ICD-10-CM | POA: Diagnosis not present

## 2017-05-08 DIAGNOSIS — M170 Bilateral primary osteoarthritis of knee: Secondary | ICD-10-CM | POA: Diagnosis not present

## 2017-05-17 DIAGNOSIS — E860 Dehydration: Secondary | ICD-10-CM | POA: Diagnosis not present

## 2017-06-07 DIAGNOSIS — N390 Urinary tract infection, site not specified: Secondary | ICD-10-CM | POA: Diagnosis not present

## 2017-06-07 DIAGNOSIS — M5137 Other intervertebral disc degeneration, lumbosacral region: Secondary | ICD-10-CM | POA: Diagnosis not present

## 2017-06-07 DIAGNOSIS — F0390 Unspecified dementia without behavioral disturbance: Secondary | ICD-10-CM | POA: Diagnosis not present

## 2017-06-14 DIAGNOSIS — R270 Ataxia, unspecified: Secondary | ICD-10-CM | POA: Diagnosis not present

## 2017-06-14 DIAGNOSIS — R2689 Other abnormalities of gait and mobility: Secondary | ICD-10-CM | POA: Diagnosis not present

## 2017-06-20 DIAGNOSIS — R2689 Other abnormalities of gait and mobility: Secondary | ICD-10-CM | POA: Diagnosis not present

## 2017-06-20 DIAGNOSIS — R270 Ataxia, unspecified: Secondary | ICD-10-CM | POA: Diagnosis not present

## 2017-06-21 DIAGNOSIS — R2689 Other abnormalities of gait and mobility: Secondary | ICD-10-CM | POA: Diagnosis not present

## 2017-06-21 DIAGNOSIS — R270 Ataxia, unspecified: Secondary | ICD-10-CM | POA: Diagnosis not present

## 2017-06-26 DIAGNOSIS — R2689 Other abnormalities of gait and mobility: Secondary | ICD-10-CM | POA: Diagnosis not present

## 2017-06-26 DIAGNOSIS — R270 Ataxia, unspecified: Secondary | ICD-10-CM | POA: Diagnosis not present

## 2017-07-02 DIAGNOSIS — R270 Ataxia, unspecified: Secondary | ICD-10-CM | POA: Diagnosis not present

## 2017-07-02 DIAGNOSIS — R2689 Other abnormalities of gait and mobility: Secondary | ICD-10-CM | POA: Diagnosis not present

## 2017-07-04 DIAGNOSIS — R270 Ataxia, unspecified: Secondary | ICD-10-CM | POA: Diagnosis not present

## 2017-07-04 DIAGNOSIS — R2689 Other abnormalities of gait and mobility: Secondary | ICD-10-CM | POA: Diagnosis not present

## 2017-08-21 DIAGNOSIS — E119 Type 2 diabetes mellitus without complications: Secondary | ICD-10-CM | POA: Diagnosis not present

## 2017-08-21 DIAGNOSIS — F0390 Unspecified dementia without behavioral disturbance: Secondary | ICD-10-CM | POA: Diagnosis not present

## 2017-08-21 DIAGNOSIS — M8580 Other specified disorders of bone density and structure, unspecified site: Secondary | ICD-10-CM | POA: Diagnosis not present

## 2017-08-21 DIAGNOSIS — M519 Unspecified thoracic, thoracolumbar and lumbosacral intervertebral disc disorder: Secondary | ICD-10-CM | POA: Diagnosis not present

## 2017-08-21 DIAGNOSIS — K219 Gastro-esophageal reflux disease without esophagitis: Secondary | ICD-10-CM | POA: Diagnosis not present

## 2017-08-21 DIAGNOSIS — Z7902 Long term (current) use of antithrombotics/antiplatelets: Secondary | ICD-10-CM | POA: Diagnosis not present

## 2017-08-21 DIAGNOSIS — M150 Primary generalized (osteo)arthritis: Secondary | ICD-10-CM | POA: Diagnosis not present

## 2017-08-21 DIAGNOSIS — Z87440 Personal history of urinary (tract) infections: Secondary | ICD-10-CM | POA: Diagnosis not present

## 2017-08-21 DIAGNOSIS — Z7984 Long term (current) use of oral hypoglycemic drugs: Secondary | ICD-10-CM | POA: Diagnosis not present

## 2017-08-23 DIAGNOSIS — E119 Type 2 diabetes mellitus without complications: Secondary | ICD-10-CM | POA: Diagnosis not present

## 2017-08-23 DIAGNOSIS — M8580 Other specified disorders of bone density and structure, unspecified site: Secondary | ICD-10-CM | POA: Diagnosis not present

## 2017-08-23 DIAGNOSIS — K219 Gastro-esophageal reflux disease without esophagitis: Secondary | ICD-10-CM | POA: Diagnosis not present

## 2017-08-23 DIAGNOSIS — M150 Primary generalized (osteo)arthritis: Secondary | ICD-10-CM | POA: Diagnosis not present

## 2017-08-23 DIAGNOSIS — M519 Unspecified thoracic, thoracolumbar and lumbosacral intervertebral disc disorder: Secondary | ICD-10-CM | POA: Diagnosis not present

## 2017-08-23 DIAGNOSIS — F0390 Unspecified dementia without behavioral disturbance: Secondary | ICD-10-CM | POA: Diagnosis not present

## 2017-08-27 DIAGNOSIS — K219 Gastro-esophageal reflux disease without esophagitis: Secondary | ICD-10-CM | POA: Diagnosis not present

## 2017-08-27 DIAGNOSIS — E119 Type 2 diabetes mellitus without complications: Secondary | ICD-10-CM | POA: Diagnosis not present

## 2017-08-27 DIAGNOSIS — M8580 Other specified disorders of bone density and structure, unspecified site: Secondary | ICD-10-CM | POA: Diagnosis not present

## 2017-08-27 DIAGNOSIS — M150 Primary generalized (osteo)arthritis: Secondary | ICD-10-CM | POA: Diagnosis not present

## 2017-08-27 DIAGNOSIS — F0390 Unspecified dementia without behavioral disturbance: Secondary | ICD-10-CM | POA: Diagnosis not present

## 2017-08-27 DIAGNOSIS — M519 Unspecified thoracic, thoracolumbar and lumbosacral intervertebral disc disorder: Secondary | ICD-10-CM | POA: Diagnosis not present

## 2017-08-29 DIAGNOSIS — M519 Unspecified thoracic, thoracolumbar and lumbosacral intervertebral disc disorder: Secondary | ICD-10-CM | POA: Diagnosis not present

## 2017-08-29 DIAGNOSIS — F0390 Unspecified dementia without behavioral disturbance: Secondary | ICD-10-CM | POA: Diagnosis not present

## 2017-08-29 DIAGNOSIS — M150 Primary generalized (osteo)arthritis: Secondary | ICD-10-CM | POA: Diagnosis not present

## 2017-08-29 DIAGNOSIS — E119 Type 2 diabetes mellitus without complications: Secondary | ICD-10-CM | POA: Diagnosis not present

## 2017-08-29 DIAGNOSIS — M8580 Other specified disorders of bone density and structure, unspecified site: Secondary | ICD-10-CM | POA: Diagnosis not present

## 2017-08-29 DIAGNOSIS — K219 Gastro-esophageal reflux disease without esophagitis: Secondary | ICD-10-CM | POA: Diagnosis not present

## 2017-09-03 DIAGNOSIS — M519 Unspecified thoracic, thoracolumbar and lumbosacral intervertebral disc disorder: Secondary | ICD-10-CM | POA: Diagnosis not present

## 2017-09-03 DIAGNOSIS — M150 Primary generalized (osteo)arthritis: Secondary | ICD-10-CM | POA: Diagnosis not present

## 2017-09-03 DIAGNOSIS — F0390 Unspecified dementia without behavioral disturbance: Secondary | ICD-10-CM | POA: Diagnosis not present

## 2017-09-03 DIAGNOSIS — E119 Type 2 diabetes mellitus without complications: Secondary | ICD-10-CM | POA: Diagnosis not present

## 2017-09-03 DIAGNOSIS — M8580 Other specified disorders of bone density and structure, unspecified site: Secondary | ICD-10-CM | POA: Diagnosis not present

## 2017-09-03 DIAGNOSIS — K219 Gastro-esophageal reflux disease without esophagitis: Secondary | ICD-10-CM | POA: Diagnosis not present

## 2017-09-05 DIAGNOSIS — K219 Gastro-esophageal reflux disease without esophagitis: Secondary | ICD-10-CM | POA: Diagnosis not present

## 2017-09-05 DIAGNOSIS — E119 Type 2 diabetes mellitus without complications: Secondary | ICD-10-CM | POA: Diagnosis not present

## 2017-09-05 DIAGNOSIS — M150 Primary generalized (osteo)arthritis: Secondary | ICD-10-CM | POA: Diagnosis not present

## 2017-09-05 DIAGNOSIS — M519 Unspecified thoracic, thoracolumbar and lumbosacral intervertebral disc disorder: Secondary | ICD-10-CM | POA: Diagnosis not present

## 2017-09-05 DIAGNOSIS — F0390 Unspecified dementia without behavioral disturbance: Secondary | ICD-10-CM | POA: Diagnosis not present

## 2017-09-05 DIAGNOSIS — M8580 Other specified disorders of bone density and structure, unspecified site: Secondary | ICD-10-CM | POA: Diagnosis not present

## 2017-09-10 DIAGNOSIS — E119 Type 2 diabetes mellitus without complications: Secondary | ICD-10-CM | POA: Diagnosis not present

## 2017-09-10 DIAGNOSIS — K219 Gastro-esophageal reflux disease without esophagitis: Secondary | ICD-10-CM | POA: Diagnosis not present

## 2017-09-10 DIAGNOSIS — M150 Primary generalized (osteo)arthritis: Secondary | ICD-10-CM | POA: Diagnosis not present

## 2017-09-10 DIAGNOSIS — F0390 Unspecified dementia without behavioral disturbance: Secondary | ICD-10-CM | POA: Diagnosis not present

## 2017-09-10 DIAGNOSIS — M8580 Other specified disorders of bone density and structure, unspecified site: Secondary | ICD-10-CM | POA: Diagnosis not present

## 2017-09-10 DIAGNOSIS — M519 Unspecified thoracic, thoracolumbar and lumbosacral intervertebral disc disorder: Secondary | ICD-10-CM | POA: Diagnosis not present

## 2017-09-11 DIAGNOSIS — F0390 Unspecified dementia without behavioral disturbance: Secondary | ICD-10-CM | POA: Diagnosis not present

## 2017-09-11 DIAGNOSIS — Z7984 Long term (current) use of oral hypoglycemic drugs: Secondary | ICD-10-CM | POA: Diagnosis not present

## 2017-09-11 DIAGNOSIS — I129 Hypertensive chronic kidney disease with stage 1 through stage 4 chronic kidney disease, or unspecified chronic kidney disease: Secondary | ICD-10-CM | POA: Diagnosis not present

## 2017-09-11 DIAGNOSIS — N3281 Overactive bladder: Secondary | ICD-10-CM | POA: Diagnosis not present

## 2017-09-11 DIAGNOSIS — N183 Chronic kidney disease, stage 3 (moderate): Secondary | ICD-10-CM | POA: Diagnosis not present

## 2017-09-11 DIAGNOSIS — E1122 Type 2 diabetes mellitus with diabetic chronic kidney disease: Secondary | ICD-10-CM | POA: Diagnosis not present

## 2017-09-13 DIAGNOSIS — E119 Type 2 diabetes mellitus without complications: Secondary | ICD-10-CM | POA: Diagnosis not present

## 2017-09-13 DIAGNOSIS — K219 Gastro-esophageal reflux disease without esophagitis: Secondary | ICD-10-CM | POA: Diagnosis not present

## 2017-09-13 DIAGNOSIS — M8580 Other specified disorders of bone density and structure, unspecified site: Secondary | ICD-10-CM | POA: Diagnosis not present

## 2017-09-13 DIAGNOSIS — F0390 Unspecified dementia without behavioral disturbance: Secondary | ICD-10-CM | POA: Diagnosis not present

## 2017-09-13 DIAGNOSIS — M150 Primary generalized (osteo)arthritis: Secondary | ICD-10-CM | POA: Diagnosis not present

## 2017-09-13 DIAGNOSIS — M519 Unspecified thoracic, thoracolumbar and lumbosacral intervertebral disc disorder: Secondary | ICD-10-CM | POA: Diagnosis not present

## 2017-09-14 DIAGNOSIS — M1712 Unilateral primary osteoarthritis, left knee: Secondary | ICD-10-CM | POA: Diagnosis not present

## 2017-09-14 DIAGNOSIS — M1711 Unilateral primary osteoarthritis, right knee: Secondary | ICD-10-CM | POA: Diagnosis not present

## 2017-11-29 DIAGNOSIS — Z23 Encounter for immunization: Secondary | ICD-10-CM | POA: Diagnosis not present

## 2017-11-29 DIAGNOSIS — R32 Unspecified urinary incontinence: Secondary | ICD-10-CM | POA: Diagnosis not present

## 2017-11-29 DIAGNOSIS — W19XXXA Unspecified fall, initial encounter: Secondary | ICD-10-CM | POA: Diagnosis not present

## 2017-11-29 DIAGNOSIS — F0390 Unspecified dementia without behavioral disturbance: Secondary | ICD-10-CM | POA: Diagnosis not present

## 2017-11-29 DIAGNOSIS — R260 Ataxic gait: Secondary | ICD-10-CM | POA: Diagnosis not present

## 2017-11-29 DIAGNOSIS — S51819A Laceration without foreign body of unspecified forearm, initial encounter: Secondary | ICD-10-CM | POA: Diagnosis not present

## 2017-11-29 DIAGNOSIS — R5381 Other malaise: Secondary | ICD-10-CM | POA: Diagnosis not present

## 2017-12-03 DIAGNOSIS — M8580 Other specified disorders of bone density and structure, unspecified site: Secondary | ICD-10-CM | POA: Diagnosis not present

## 2017-12-03 DIAGNOSIS — M1990 Unspecified osteoarthritis, unspecified site: Secondary | ICD-10-CM | POA: Diagnosis not present

## 2017-12-03 DIAGNOSIS — W010XXD Fall on same level from slipping, tripping and stumbling without subsequent striking against object, subsequent encounter: Secondary | ICD-10-CM | POA: Diagnosis not present

## 2017-12-03 DIAGNOSIS — F0390 Unspecified dementia without behavioral disturbance: Secondary | ICD-10-CM | POA: Diagnosis not present

## 2017-12-03 DIAGNOSIS — R2689 Other abnormalities of gait and mobility: Secondary | ICD-10-CM | POA: Diagnosis not present

## 2017-12-03 DIAGNOSIS — S51812D Laceration without foreign body of left forearm, subsequent encounter: Secondary | ICD-10-CM | POA: Diagnosis not present

## 2017-12-03 DIAGNOSIS — E119 Type 2 diabetes mellitus without complications: Secondary | ICD-10-CM | POA: Diagnosis not present

## 2017-12-03 DIAGNOSIS — H547 Unspecified visual loss: Secondary | ICD-10-CM | POA: Diagnosis not present

## 2017-12-03 DIAGNOSIS — Z9181 History of falling: Secondary | ICD-10-CM | POA: Diagnosis not present

## 2017-12-03 DIAGNOSIS — Z7902 Long term (current) use of antithrombotics/antiplatelets: Secondary | ICD-10-CM | POA: Diagnosis not present

## 2017-12-03 DIAGNOSIS — Z87891 Personal history of nicotine dependence: Secondary | ICD-10-CM | POA: Diagnosis not present

## 2017-12-03 DIAGNOSIS — Z7984 Long term (current) use of oral hypoglycemic drugs: Secondary | ICD-10-CM | POA: Diagnosis not present

## 2017-12-03 DIAGNOSIS — Z79899 Other long term (current) drug therapy: Secondary | ICD-10-CM | POA: Diagnosis not present

## 2017-12-03 DIAGNOSIS — I10 Essential (primary) hypertension: Secondary | ICD-10-CM | POA: Diagnosis not present

## 2017-12-03 DIAGNOSIS — R5381 Other malaise: Secondary | ICD-10-CM | POA: Diagnosis not present

## 2017-12-03 DIAGNOSIS — Y92008 Other place in unspecified non-institutional (private) residence as the place of occurrence of the external cause: Secondary | ICD-10-CM | POA: Diagnosis not present

## 2017-12-04 DIAGNOSIS — E119 Type 2 diabetes mellitus without complications: Secondary | ICD-10-CM | POA: Diagnosis not present

## 2017-12-04 DIAGNOSIS — H547 Unspecified visual loss: Secondary | ICD-10-CM | POA: Diagnosis not present

## 2017-12-04 DIAGNOSIS — F0390 Unspecified dementia without behavioral disturbance: Secondary | ICD-10-CM | POA: Diagnosis not present

## 2017-12-04 DIAGNOSIS — S51812D Laceration without foreign body of left forearm, subsequent encounter: Secondary | ICD-10-CM | POA: Diagnosis not present

## 2017-12-04 DIAGNOSIS — M1990 Unspecified osteoarthritis, unspecified site: Secondary | ICD-10-CM | POA: Diagnosis not present

## 2017-12-04 DIAGNOSIS — R2689 Other abnormalities of gait and mobility: Secondary | ICD-10-CM | POA: Diagnosis not present

## 2017-12-06 DIAGNOSIS — E119 Type 2 diabetes mellitus without complications: Secondary | ICD-10-CM | POA: Diagnosis not present

## 2017-12-06 DIAGNOSIS — M1990 Unspecified osteoarthritis, unspecified site: Secondary | ICD-10-CM | POA: Diagnosis not present

## 2017-12-06 DIAGNOSIS — S51812D Laceration without foreign body of left forearm, subsequent encounter: Secondary | ICD-10-CM | POA: Diagnosis not present

## 2017-12-06 DIAGNOSIS — H547 Unspecified visual loss: Secondary | ICD-10-CM | POA: Diagnosis not present

## 2017-12-06 DIAGNOSIS — R2689 Other abnormalities of gait and mobility: Secondary | ICD-10-CM | POA: Diagnosis not present

## 2017-12-06 DIAGNOSIS — F0390 Unspecified dementia without behavioral disturbance: Secondary | ICD-10-CM | POA: Diagnosis not present

## 2017-12-10 DIAGNOSIS — R2689 Other abnormalities of gait and mobility: Secondary | ICD-10-CM | POA: Diagnosis not present

## 2017-12-10 DIAGNOSIS — S51812D Laceration without foreign body of left forearm, subsequent encounter: Secondary | ICD-10-CM | POA: Diagnosis not present

## 2017-12-10 DIAGNOSIS — H547 Unspecified visual loss: Secondary | ICD-10-CM | POA: Diagnosis not present

## 2017-12-10 DIAGNOSIS — E119 Type 2 diabetes mellitus without complications: Secondary | ICD-10-CM | POA: Diagnosis not present

## 2017-12-10 DIAGNOSIS — M1990 Unspecified osteoarthritis, unspecified site: Secondary | ICD-10-CM | POA: Diagnosis not present

## 2017-12-10 DIAGNOSIS — F0390 Unspecified dementia without behavioral disturbance: Secondary | ICD-10-CM | POA: Diagnosis not present

## 2017-12-11 DIAGNOSIS — F0390 Unspecified dementia without behavioral disturbance: Secondary | ICD-10-CM | POA: Diagnosis not present

## 2017-12-11 DIAGNOSIS — S51812D Laceration without foreign body of left forearm, subsequent encounter: Secondary | ICD-10-CM | POA: Diagnosis not present

## 2017-12-11 DIAGNOSIS — H547 Unspecified visual loss: Secondary | ICD-10-CM | POA: Diagnosis not present

## 2017-12-11 DIAGNOSIS — R2689 Other abnormalities of gait and mobility: Secondary | ICD-10-CM | POA: Diagnosis not present

## 2017-12-11 DIAGNOSIS — E119 Type 2 diabetes mellitus without complications: Secondary | ICD-10-CM | POA: Diagnosis not present

## 2017-12-11 DIAGNOSIS — M1990 Unspecified osteoarthritis, unspecified site: Secondary | ICD-10-CM | POA: Diagnosis not present

## 2017-12-12 DIAGNOSIS — M1990 Unspecified osteoarthritis, unspecified site: Secondary | ICD-10-CM | POA: Diagnosis not present

## 2017-12-12 DIAGNOSIS — S51812D Laceration without foreign body of left forearm, subsequent encounter: Secondary | ICD-10-CM | POA: Diagnosis not present

## 2017-12-12 DIAGNOSIS — F0390 Unspecified dementia without behavioral disturbance: Secondary | ICD-10-CM | POA: Diagnosis not present

## 2017-12-12 DIAGNOSIS — R2689 Other abnormalities of gait and mobility: Secondary | ICD-10-CM | POA: Diagnosis not present

## 2017-12-12 DIAGNOSIS — H547 Unspecified visual loss: Secondary | ICD-10-CM | POA: Diagnosis not present

## 2017-12-12 DIAGNOSIS — E119 Type 2 diabetes mellitus without complications: Secondary | ICD-10-CM | POA: Diagnosis not present

## 2017-12-17 DIAGNOSIS — F0390 Unspecified dementia without behavioral disturbance: Secondary | ICD-10-CM | POA: Diagnosis not present

## 2017-12-17 DIAGNOSIS — H547 Unspecified visual loss: Secondary | ICD-10-CM | POA: Diagnosis not present

## 2017-12-17 DIAGNOSIS — M1990 Unspecified osteoarthritis, unspecified site: Secondary | ICD-10-CM | POA: Diagnosis not present

## 2017-12-17 DIAGNOSIS — S51812D Laceration without foreign body of left forearm, subsequent encounter: Secondary | ICD-10-CM | POA: Diagnosis not present

## 2017-12-17 DIAGNOSIS — R2689 Other abnormalities of gait and mobility: Secondary | ICD-10-CM | POA: Diagnosis not present

## 2017-12-17 DIAGNOSIS — E119 Type 2 diabetes mellitus without complications: Secondary | ICD-10-CM | POA: Diagnosis not present

## 2017-12-18 DIAGNOSIS — E119 Type 2 diabetes mellitus without complications: Secondary | ICD-10-CM | POA: Diagnosis not present

## 2017-12-18 DIAGNOSIS — R2689 Other abnormalities of gait and mobility: Secondary | ICD-10-CM | POA: Diagnosis not present

## 2017-12-18 DIAGNOSIS — F0390 Unspecified dementia without behavioral disturbance: Secondary | ICD-10-CM | POA: Diagnosis not present

## 2017-12-18 DIAGNOSIS — H547 Unspecified visual loss: Secondary | ICD-10-CM | POA: Diagnosis not present

## 2017-12-18 DIAGNOSIS — M1990 Unspecified osteoarthritis, unspecified site: Secondary | ICD-10-CM | POA: Diagnosis not present

## 2017-12-18 DIAGNOSIS — S51812D Laceration without foreign body of left forearm, subsequent encounter: Secondary | ICD-10-CM | POA: Diagnosis not present

## 2017-12-19 DIAGNOSIS — F0390 Unspecified dementia without behavioral disturbance: Secondary | ICD-10-CM | POA: Diagnosis not present

## 2017-12-19 DIAGNOSIS — H547 Unspecified visual loss: Secondary | ICD-10-CM | POA: Diagnosis not present

## 2017-12-19 DIAGNOSIS — R2689 Other abnormalities of gait and mobility: Secondary | ICD-10-CM | POA: Diagnosis not present

## 2017-12-19 DIAGNOSIS — S51812D Laceration without foreign body of left forearm, subsequent encounter: Secondary | ICD-10-CM | POA: Diagnosis not present

## 2017-12-19 DIAGNOSIS — E119 Type 2 diabetes mellitus without complications: Secondary | ICD-10-CM | POA: Diagnosis not present

## 2017-12-19 DIAGNOSIS — M1990 Unspecified osteoarthritis, unspecified site: Secondary | ICD-10-CM | POA: Diagnosis not present

## 2017-12-21 DIAGNOSIS — M1990 Unspecified osteoarthritis, unspecified site: Secondary | ICD-10-CM | POA: Diagnosis not present

## 2017-12-21 DIAGNOSIS — F0390 Unspecified dementia without behavioral disturbance: Secondary | ICD-10-CM | POA: Diagnosis not present

## 2017-12-21 DIAGNOSIS — E119 Type 2 diabetes mellitus without complications: Secondary | ICD-10-CM | POA: Diagnosis not present

## 2017-12-21 DIAGNOSIS — R2689 Other abnormalities of gait and mobility: Secondary | ICD-10-CM | POA: Diagnosis not present

## 2017-12-21 DIAGNOSIS — H547 Unspecified visual loss: Secondary | ICD-10-CM | POA: Diagnosis not present

## 2017-12-21 DIAGNOSIS — S51812D Laceration without foreign body of left forearm, subsequent encounter: Secondary | ICD-10-CM | POA: Diagnosis not present

## 2017-12-24 DIAGNOSIS — R2689 Other abnormalities of gait and mobility: Secondary | ICD-10-CM | POA: Diagnosis not present

## 2017-12-24 DIAGNOSIS — S51812D Laceration without foreign body of left forearm, subsequent encounter: Secondary | ICD-10-CM | POA: Diagnosis not present

## 2017-12-24 DIAGNOSIS — H547 Unspecified visual loss: Secondary | ICD-10-CM | POA: Diagnosis not present

## 2017-12-24 DIAGNOSIS — M1990 Unspecified osteoarthritis, unspecified site: Secondary | ICD-10-CM | POA: Diagnosis not present

## 2017-12-24 DIAGNOSIS — E119 Type 2 diabetes mellitus without complications: Secondary | ICD-10-CM | POA: Diagnosis not present

## 2017-12-24 DIAGNOSIS — F0390 Unspecified dementia without behavioral disturbance: Secondary | ICD-10-CM | POA: Diagnosis not present

## 2017-12-26 DIAGNOSIS — F0390 Unspecified dementia without behavioral disturbance: Secondary | ICD-10-CM | POA: Diagnosis not present

## 2017-12-26 DIAGNOSIS — H547 Unspecified visual loss: Secondary | ICD-10-CM | POA: Diagnosis not present

## 2017-12-26 DIAGNOSIS — R2689 Other abnormalities of gait and mobility: Secondary | ICD-10-CM | POA: Diagnosis not present

## 2017-12-26 DIAGNOSIS — M1990 Unspecified osteoarthritis, unspecified site: Secondary | ICD-10-CM | POA: Diagnosis not present

## 2017-12-26 DIAGNOSIS — E119 Type 2 diabetes mellitus without complications: Secondary | ICD-10-CM | POA: Diagnosis not present

## 2017-12-26 DIAGNOSIS — S51812D Laceration without foreign body of left forearm, subsequent encounter: Secondary | ICD-10-CM | POA: Diagnosis not present

## 2017-12-31 DIAGNOSIS — F0390 Unspecified dementia without behavioral disturbance: Secondary | ICD-10-CM | POA: Diagnosis not present

## 2017-12-31 DIAGNOSIS — H547 Unspecified visual loss: Secondary | ICD-10-CM | POA: Diagnosis not present

## 2017-12-31 DIAGNOSIS — M1990 Unspecified osteoarthritis, unspecified site: Secondary | ICD-10-CM | POA: Diagnosis not present

## 2017-12-31 DIAGNOSIS — R2689 Other abnormalities of gait and mobility: Secondary | ICD-10-CM | POA: Diagnosis not present

## 2017-12-31 DIAGNOSIS — E119 Type 2 diabetes mellitus without complications: Secondary | ICD-10-CM | POA: Diagnosis not present

## 2017-12-31 DIAGNOSIS — S51812D Laceration without foreign body of left forearm, subsequent encounter: Secondary | ICD-10-CM | POA: Diagnosis not present

## 2018-01-02 DIAGNOSIS — E119 Type 2 diabetes mellitus without complications: Secondary | ICD-10-CM | POA: Diagnosis not present

## 2018-01-02 DIAGNOSIS — S51812D Laceration without foreign body of left forearm, subsequent encounter: Secondary | ICD-10-CM | POA: Diagnosis not present

## 2018-01-02 DIAGNOSIS — H547 Unspecified visual loss: Secondary | ICD-10-CM | POA: Diagnosis not present

## 2018-01-02 DIAGNOSIS — R2689 Other abnormalities of gait and mobility: Secondary | ICD-10-CM | POA: Diagnosis not present

## 2018-01-02 DIAGNOSIS — F0390 Unspecified dementia without behavioral disturbance: Secondary | ICD-10-CM | POA: Diagnosis not present

## 2018-01-02 DIAGNOSIS — M1990 Unspecified osteoarthritis, unspecified site: Secondary | ICD-10-CM | POA: Diagnosis not present

## 2018-01-03 DIAGNOSIS — R2689 Other abnormalities of gait and mobility: Secondary | ICD-10-CM | POA: Diagnosis not present

## 2018-01-03 DIAGNOSIS — F0390 Unspecified dementia without behavioral disturbance: Secondary | ICD-10-CM | POA: Diagnosis not present

## 2018-01-03 DIAGNOSIS — E119 Type 2 diabetes mellitus without complications: Secondary | ICD-10-CM | POA: Diagnosis not present

## 2018-01-03 DIAGNOSIS — S51812D Laceration without foreign body of left forearm, subsequent encounter: Secondary | ICD-10-CM | POA: Diagnosis not present

## 2018-01-03 DIAGNOSIS — M1990 Unspecified osteoarthritis, unspecified site: Secondary | ICD-10-CM | POA: Diagnosis not present

## 2018-01-03 DIAGNOSIS — H547 Unspecified visual loss: Secondary | ICD-10-CM | POA: Diagnosis not present

## 2018-01-04 DIAGNOSIS — M170 Bilateral primary osteoarthritis of knee: Secondary | ICD-10-CM | POA: Diagnosis not present

## 2018-01-04 DIAGNOSIS — M25511 Pain in right shoulder: Secondary | ICD-10-CM | POA: Diagnosis not present

## 2018-01-04 DIAGNOSIS — M1712 Unilateral primary osteoarthritis, left knee: Secondary | ICD-10-CM | POA: Diagnosis not present

## 2018-01-04 DIAGNOSIS — M1711 Unilateral primary osteoarthritis, right knee: Secondary | ICD-10-CM | POA: Diagnosis not present

## 2018-01-07 DIAGNOSIS — H547 Unspecified visual loss: Secondary | ICD-10-CM | POA: Diagnosis not present

## 2018-01-07 DIAGNOSIS — E119 Type 2 diabetes mellitus without complications: Secondary | ICD-10-CM | POA: Diagnosis not present

## 2018-01-07 DIAGNOSIS — F0390 Unspecified dementia without behavioral disturbance: Secondary | ICD-10-CM | POA: Diagnosis not present

## 2018-01-07 DIAGNOSIS — S51812D Laceration without foreign body of left forearm, subsequent encounter: Secondary | ICD-10-CM | POA: Diagnosis not present

## 2018-01-07 DIAGNOSIS — R2689 Other abnormalities of gait and mobility: Secondary | ICD-10-CM | POA: Diagnosis not present

## 2018-01-07 DIAGNOSIS — M1990 Unspecified osteoarthritis, unspecified site: Secondary | ICD-10-CM | POA: Diagnosis not present

## 2018-01-09 DIAGNOSIS — M1990 Unspecified osteoarthritis, unspecified site: Secondary | ICD-10-CM | POA: Diagnosis not present

## 2018-01-09 DIAGNOSIS — S51812D Laceration without foreign body of left forearm, subsequent encounter: Secondary | ICD-10-CM | POA: Diagnosis not present

## 2018-01-09 DIAGNOSIS — E119 Type 2 diabetes mellitus without complications: Secondary | ICD-10-CM | POA: Diagnosis not present

## 2018-01-09 DIAGNOSIS — F0390 Unspecified dementia without behavioral disturbance: Secondary | ICD-10-CM | POA: Diagnosis not present

## 2018-01-09 DIAGNOSIS — R2689 Other abnormalities of gait and mobility: Secondary | ICD-10-CM | POA: Diagnosis not present

## 2018-01-09 DIAGNOSIS — H547 Unspecified visual loss: Secondary | ICD-10-CM | POA: Diagnosis not present

## 2018-01-14 DIAGNOSIS — S51812D Laceration without foreign body of left forearm, subsequent encounter: Secondary | ICD-10-CM | POA: Diagnosis not present

## 2018-01-14 DIAGNOSIS — E119 Type 2 diabetes mellitus without complications: Secondary | ICD-10-CM | POA: Diagnosis not present

## 2018-01-14 DIAGNOSIS — R2689 Other abnormalities of gait and mobility: Secondary | ICD-10-CM | POA: Diagnosis not present

## 2018-01-14 DIAGNOSIS — M1990 Unspecified osteoarthritis, unspecified site: Secondary | ICD-10-CM | POA: Diagnosis not present

## 2018-01-14 DIAGNOSIS — F0390 Unspecified dementia without behavioral disturbance: Secondary | ICD-10-CM | POA: Diagnosis not present

## 2018-01-14 DIAGNOSIS — H547 Unspecified visual loss: Secondary | ICD-10-CM | POA: Diagnosis not present

## 2018-01-16 DIAGNOSIS — H547 Unspecified visual loss: Secondary | ICD-10-CM | POA: Diagnosis not present

## 2018-01-16 DIAGNOSIS — E119 Type 2 diabetes mellitus without complications: Secondary | ICD-10-CM | POA: Diagnosis not present

## 2018-01-16 DIAGNOSIS — F0390 Unspecified dementia without behavioral disturbance: Secondary | ICD-10-CM | POA: Diagnosis not present

## 2018-01-16 DIAGNOSIS — R2689 Other abnormalities of gait and mobility: Secondary | ICD-10-CM | POA: Diagnosis not present

## 2018-01-16 DIAGNOSIS — M1990 Unspecified osteoarthritis, unspecified site: Secondary | ICD-10-CM | POA: Diagnosis not present

## 2018-01-16 DIAGNOSIS — S51812D Laceration without foreign body of left forearm, subsequent encounter: Secondary | ICD-10-CM | POA: Diagnosis not present

## 2018-01-18 DIAGNOSIS — S51812D Laceration without foreign body of left forearm, subsequent encounter: Secondary | ICD-10-CM | POA: Diagnosis not present

## 2018-01-18 DIAGNOSIS — H547 Unspecified visual loss: Secondary | ICD-10-CM | POA: Diagnosis not present

## 2018-01-18 DIAGNOSIS — M1990 Unspecified osteoarthritis, unspecified site: Secondary | ICD-10-CM | POA: Diagnosis not present

## 2018-01-18 DIAGNOSIS — F0390 Unspecified dementia without behavioral disturbance: Secondary | ICD-10-CM | POA: Diagnosis not present

## 2018-01-18 DIAGNOSIS — E119 Type 2 diabetes mellitus without complications: Secondary | ICD-10-CM | POA: Diagnosis not present

## 2018-01-18 DIAGNOSIS — R2689 Other abnormalities of gait and mobility: Secondary | ICD-10-CM | POA: Diagnosis not present

## 2018-01-22 DIAGNOSIS — H547 Unspecified visual loss: Secondary | ICD-10-CM | POA: Diagnosis not present

## 2018-01-22 DIAGNOSIS — E119 Type 2 diabetes mellitus without complications: Secondary | ICD-10-CM | POA: Diagnosis not present

## 2018-01-22 DIAGNOSIS — S51812D Laceration without foreign body of left forearm, subsequent encounter: Secondary | ICD-10-CM | POA: Diagnosis not present

## 2018-01-22 DIAGNOSIS — F0390 Unspecified dementia without behavioral disturbance: Secondary | ICD-10-CM | POA: Diagnosis not present

## 2018-01-22 DIAGNOSIS — R2689 Other abnormalities of gait and mobility: Secondary | ICD-10-CM | POA: Diagnosis not present

## 2018-01-22 DIAGNOSIS — M1990 Unspecified osteoarthritis, unspecified site: Secondary | ICD-10-CM | POA: Diagnosis not present

## 2018-01-25 DIAGNOSIS — H547 Unspecified visual loss: Secondary | ICD-10-CM | POA: Diagnosis not present

## 2018-01-25 DIAGNOSIS — F0390 Unspecified dementia without behavioral disturbance: Secondary | ICD-10-CM | POA: Diagnosis not present

## 2018-01-25 DIAGNOSIS — E119 Type 2 diabetes mellitus without complications: Secondary | ICD-10-CM | POA: Diagnosis not present

## 2018-01-25 DIAGNOSIS — M1990 Unspecified osteoarthritis, unspecified site: Secondary | ICD-10-CM | POA: Diagnosis not present

## 2018-01-25 DIAGNOSIS — R2689 Other abnormalities of gait and mobility: Secondary | ICD-10-CM | POA: Diagnosis not present

## 2018-01-25 DIAGNOSIS — S51812D Laceration without foreign body of left forearm, subsequent encounter: Secondary | ICD-10-CM | POA: Diagnosis not present

## 2018-01-30 DIAGNOSIS — E119 Type 2 diabetes mellitus without complications: Secondary | ICD-10-CM | POA: Diagnosis not present

## 2018-01-30 DIAGNOSIS — H547 Unspecified visual loss: Secondary | ICD-10-CM | POA: Diagnosis not present

## 2018-01-30 DIAGNOSIS — S51812D Laceration without foreign body of left forearm, subsequent encounter: Secondary | ICD-10-CM | POA: Diagnosis not present

## 2018-01-30 DIAGNOSIS — F0390 Unspecified dementia without behavioral disturbance: Secondary | ICD-10-CM | POA: Diagnosis not present

## 2018-01-30 DIAGNOSIS — R2689 Other abnormalities of gait and mobility: Secondary | ICD-10-CM | POA: Diagnosis not present

## 2018-01-30 DIAGNOSIS — M1990 Unspecified osteoarthritis, unspecified site: Secondary | ICD-10-CM | POA: Diagnosis not present

## 2018-01-31 DIAGNOSIS — F0390 Unspecified dementia without behavioral disturbance: Secondary | ICD-10-CM | POA: Diagnosis not present

## 2018-01-31 DIAGNOSIS — M1990 Unspecified osteoarthritis, unspecified site: Secondary | ICD-10-CM | POA: Diagnosis not present

## 2018-01-31 DIAGNOSIS — S51812D Laceration without foreign body of left forearm, subsequent encounter: Secondary | ICD-10-CM | POA: Diagnosis not present

## 2018-01-31 DIAGNOSIS — E119 Type 2 diabetes mellitus without complications: Secondary | ICD-10-CM | POA: Diagnosis not present

## 2018-01-31 DIAGNOSIS — H547 Unspecified visual loss: Secondary | ICD-10-CM | POA: Diagnosis not present

## 2018-01-31 DIAGNOSIS — R2689 Other abnormalities of gait and mobility: Secondary | ICD-10-CM | POA: Diagnosis not present

## 2018-02-01 DIAGNOSIS — E119 Type 2 diabetes mellitus without complications: Secondary | ICD-10-CM | POA: Diagnosis not present

## 2018-02-01 DIAGNOSIS — Z7902 Long term (current) use of antithrombotics/antiplatelets: Secondary | ICD-10-CM | POA: Diagnosis not present

## 2018-02-01 DIAGNOSIS — Z9181 History of falling: Secondary | ICD-10-CM | POA: Diagnosis not present

## 2018-02-01 DIAGNOSIS — Z87891 Personal history of nicotine dependence: Secondary | ICD-10-CM | POA: Diagnosis not present

## 2018-02-01 DIAGNOSIS — F0390 Unspecified dementia without behavioral disturbance: Secondary | ICD-10-CM | POA: Diagnosis not present

## 2018-02-01 DIAGNOSIS — M170 Bilateral primary osteoarthritis of knee: Secondary | ICD-10-CM | POA: Diagnosis not present

## 2018-02-01 DIAGNOSIS — I10 Essential (primary) hypertension: Secondary | ICD-10-CM | POA: Diagnosis not present

## 2018-02-01 DIAGNOSIS — Z7984 Long term (current) use of oral hypoglycemic drugs: Secondary | ICD-10-CM | POA: Diagnosis not present

## 2018-02-01 DIAGNOSIS — R5381 Other malaise: Secondary | ICD-10-CM | POA: Diagnosis not present

## 2018-02-01 DIAGNOSIS — M75101 Unspecified rotator cuff tear or rupture of right shoulder, not specified as traumatic: Secondary | ICD-10-CM | POA: Diagnosis not present

## 2018-02-01 DIAGNOSIS — Y92008 Other place in unspecified non-institutional (private) residence as the place of occurrence of the external cause: Secondary | ICD-10-CM | POA: Diagnosis not present

## 2018-02-01 DIAGNOSIS — H547 Unspecified visual loss: Secondary | ICD-10-CM | POA: Diagnosis not present

## 2018-02-01 DIAGNOSIS — Z79899 Other long term (current) drug therapy: Secondary | ICD-10-CM | POA: Diagnosis not present

## 2018-02-01 DIAGNOSIS — W010XXD Fall on same level from slipping, tripping and stumbling without subsequent striking against object, subsequent encounter: Secondary | ICD-10-CM | POA: Diagnosis not present

## 2018-02-01 DIAGNOSIS — R2689 Other abnormalities of gait and mobility: Secondary | ICD-10-CM | POA: Diagnosis not present

## 2018-02-01 DIAGNOSIS — M8580 Other specified disorders of bone density and structure, unspecified site: Secondary | ICD-10-CM | POA: Diagnosis not present

## 2018-02-04 DIAGNOSIS — M75101 Unspecified rotator cuff tear or rupture of right shoulder, not specified as traumatic: Secondary | ICD-10-CM | POA: Diagnosis not present

## 2018-02-04 DIAGNOSIS — M170 Bilateral primary osteoarthritis of knee: Secondary | ICD-10-CM | POA: Diagnosis not present

## 2018-02-04 DIAGNOSIS — H547 Unspecified visual loss: Secondary | ICD-10-CM | POA: Diagnosis not present

## 2018-02-04 DIAGNOSIS — F0390 Unspecified dementia without behavioral disturbance: Secondary | ICD-10-CM | POA: Diagnosis not present

## 2018-02-04 DIAGNOSIS — R2689 Other abnormalities of gait and mobility: Secondary | ICD-10-CM | POA: Diagnosis not present

## 2018-02-04 DIAGNOSIS — E119 Type 2 diabetes mellitus without complications: Secondary | ICD-10-CM | POA: Diagnosis not present

## 2018-02-08 DIAGNOSIS — R2689 Other abnormalities of gait and mobility: Secondary | ICD-10-CM | POA: Diagnosis not present

## 2018-02-08 DIAGNOSIS — M75101 Unspecified rotator cuff tear or rupture of right shoulder, not specified as traumatic: Secondary | ICD-10-CM | POA: Diagnosis not present

## 2018-02-08 DIAGNOSIS — F0390 Unspecified dementia without behavioral disturbance: Secondary | ICD-10-CM | POA: Diagnosis not present

## 2018-02-08 DIAGNOSIS — E119 Type 2 diabetes mellitus without complications: Secondary | ICD-10-CM | POA: Diagnosis not present

## 2018-02-08 DIAGNOSIS — M170 Bilateral primary osteoarthritis of knee: Secondary | ICD-10-CM | POA: Diagnosis not present

## 2018-02-08 DIAGNOSIS — H547 Unspecified visual loss: Secondary | ICD-10-CM | POA: Diagnosis not present

## 2018-02-11 DIAGNOSIS — E119 Type 2 diabetes mellitus without complications: Secondary | ICD-10-CM | POA: Diagnosis not present

## 2018-02-11 DIAGNOSIS — H547 Unspecified visual loss: Secondary | ICD-10-CM | POA: Diagnosis not present

## 2018-02-11 DIAGNOSIS — M170 Bilateral primary osteoarthritis of knee: Secondary | ICD-10-CM | POA: Diagnosis not present

## 2018-02-11 DIAGNOSIS — R2689 Other abnormalities of gait and mobility: Secondary | ICD-10-CM | POA: Diagnosis not present

## 2018-02-11 DIAGNOSIS — M75101 Unspecified rotator cuff tear or rupture of right shoulder, not specified as traumatic: Secondary | ICD-10-CM | POA: Diagnosis not present

## 2018-02-11 DIAGNOSIS — F0390 Unspecified dementia without behavioral disturbance: Secondary | ICD-10-CM | POA: Diagnosis not present

## 2018-02-13 DIAGNOSIS — M170 Bilateral primary osteoarthritis of knee: Secondary | ICD-10-CM | POA: Diagnosis not present

## 2018-02-13 DIAGNOSIS — F0390 Unspecified dementia without behavioral disturbance: Secondary | ICD-10-CM | POA: Diagnosis not present

## 2018-02-13 DIAGNOSIS — H547 Unspecified visual loss: Secondary | ICD-10-CM | POA: Diagnosis not present

## 2018-02-13 DIAGNOSIS — R2689 Other abnormalities of gait and mobility: Secondary | ICD-10-CM | POA: Diagnosis not present

## 2018-02-13 DIAGNOSIS — M75101 Unspecified rotator cuff tear or rupture of right shoulder, not specified as traumatic: Secondary | ICD-10-CM | POA: Diagnosis not present

## 2018-02-13 DIAGNOSIS — E119 Type 2 diabetes mellitus without complications: Secondary | ICD-10-CM | POA: Diagnosis not present

## 2018-02-18 DIAGNOSIS — M75101 Unspecified rotator cuff tear or rupture of right shoulder, not specified as traumatic: Secondary | ICD-10-CM | POA: Diagnosis not present

## 2018-02-18 DIAGNOSIS — F0390 Unspecified dementia without behavioral disturbance: Secondary | ICD-10-CM | POA: Diagnosis not present

## 2018-02-18 DIAGNOSIS — M170 Bilateral primary osteoarthritis of knee: Secondary | ICD-10-CM | POA: Diagnosis not present

## 2018-02-18 DIAGNOSIS — R2689 Other abnormalities of gait and mobility: Secondary | ICD-10-CM | POA: Diagnosis not present

## 2018-02-18 DIAGNOSIS — H547 Unspecified visual loss: Secondary | ICD-10-CM | POA: Diagnosis not present

## 2018-02-18 DIAGNOSIS — E119 Type 2 diabetes mellitus without complications: Secondary | ICD-10-CM | POA: Diagnosis not present

## 2018-02-25 DIAGNOSIS — R2689 Other abnormalities of gait and mobility: Secondary | ICD-10-CM | POA: Diagnosis not present

## 2018-02-25 DIAGNOSIS — H547 Unspecified visual loss: Secondary | ICD-10-CM | POA: Diagnosis not present

## 2018-02-25 DIAGNOSIS — M75101 Unspecified rotator cuff tear or rupture of right shoulder, not specified as traumatic: Secondary | ICD-10-CM | POA: Diagnosis not present

## 2018-02-25 DIAGNOSIS — M170 Bilateral primary osteoarthritis of knee: Secondary | ICD-10-CM | POA: Diagnosis not present

## 2018-02-25 DIAGNOSIS — F0390 Unspecified dementia without behavioral disturbance: Secondary | ICD-10-CM | POA: Diagnosis not present

## 2018-02-25 DIAGNOSIS — E119 Type 2 diabetes mellitus without complications: Secondary | ICD-10-CM | POA: Diagnosis not present

## 2018-03-05 DIAGNOSIS — M75101 Unspecified rotator cuff tear or rupture of right shoulder, not specified as traumatic: Secondary | ICD-10-CM | POA: Diagnosis not present

## 2018-03-05 DIAGNOSIS — H547 Unspecified visual loss: Secondary | ICD-10-CM | POA: Diagnosis not present

## 2018-03-05 DIAGNOSIS — M170 Bilateral primary osteoarthritis of knee: Secondary | ICD-10-CM | POA: Diagnosis not present

## 2018-03-05 DIAGNOSIS — E119 Type 2 diabetes mellitus without complications: Secondary | ICD-10-CM | POA: Diagnosis not present

## 2018-03-05 DIAGNOSIS — R2689 Other abnormalities of gait and mobility: Secondary | ICD-10-CM | POA: Diagnosis not present

## 2018-03-05 DIAGNOSIS — F0390 Unspecified dementia without behavioral disturbance: Secondary | ICD-10-CM | POA: Diagnosis not present

## 2018-03-07 DIAGNOSIS — M75101 Unspecified rotator cuff tear or rupture of right shoulder, not specified as traumatic: Secondary | ICD-10-CM | POA: Diagnosis not present

## 2018-03-07 DIAGNOSIS — H547 Unspecified visual loss: Secondary | ICD-10-CM | POA: Diagnosis not present

## 2018-03-07 DIAGNOSIS — F0390 Unspecified dementia without behavioral disturbance: Secondary | ICD-10-CM | POA: Diagnosis not present

## 2018-03-07 DIAGNOSIS — M170 Bilateral primary osteoarthritis of knee: Secondary | ICD-10-CM | POA: Diagnosis not present

## 2018-03-07 DIAGNOSIS — E119 Type 2 diabetes mellitus without complications: Secondary | ICD-10-CM | POA: Diagnosis not present

## 2018-03-07 DIAGNOSIS — R2689 Other abnormalities of gait and mobility: Secondary | ICD-10-CM | POA: Diagnosis not present

## 2018-03-21 DIAGNOSIS — E782 Mixed hyperlipidemia: Secondary | ICD-10-CM | POA: Diagnosis not present

## 2018-03-21 DIAGNOSIS — D692 Other nonthrombocytopenic purpura: Secondary | ICD-10-CM | POA: Diagnosis not present

## 2018-03-21 DIAGNOSIS — R5381 Other malaise: Secondary | ICD-10-CM | POA: Diagnosis not present

## 2018-03-21 DIAGNOSIS — Z0000 Encounter for general adult medical examination without abnormal findings: Secondary | ICD-10-CM | POA: Diagnosis not present

## 2018-03-21 DIAGNOSIS — N183 Chronic kidney disease, stage 3 (moderate): Secondary | ICD-10-CM | POA: Diagnosis not present

## 2018-03-21 DIAGNOSIS — N390 Urinary tract infection, site not specified: Secondary | ICD-10-CM | POA: Diagnosis not present

## 2018-03-21 DIAGNOSIS — F0390 Unspecified dementia without behavioral disturbance: Secondary | ICD-10-CM | POA: Diagnosis not present

## 2018-03-21 DIAGNOSIS — S51801A Unspecified open wound of right forearm, initial encounter: Secondary | ICD-10-CM | POA: Diagnosis not present

## 2018-03-21 DIAGNOSIS — K219 Gastro-esophageal reflux disease without esophagitis: Secondary | ICD-10-CM | POA: Diagnosis not present

## 2018-03-21 DIAGNOSIS — I129 Hypertensive chronic kidney disease with stage 1 through stage 4 chronic kidney disease, or unspecified chronic kidney disease: Secondary | ICD-10-CM | POA: Diagnosis not present

## 2018-03-21 DIAGNOSIS — E1122 Type 2 diabetes mellitus with diabetic chronic kidney disease: Secondary | ICD-10-CM | POA: Diagnosis not present

## 2018-03-21 DIAGNOSIS — I700 Atherosclerosis of aorta: Secondary | ICD-10-CM | POA: Diagnosis not present

## 2018-04-22 DIAGNOSIS — M1711 Unilateral primary osteoarthritis, right knee: Secondary | ICD-10-CM | POA: Diagnosis not present

## 2018-04-22 DIAGNOSIS — M6281 Muscle weakness (generalized): Secondary | ICD-10-CM | POA: Diagnosis not present

## 2018-04-22 DIAGNOSIS — M1712 Unilateral primary osteoarthritis, left knee: Secondary | ICD-10-CM | POA: Diagnosis not present

## 2018-04-22 DIAGNOSIS — Z9189 Other specified personal risk factors, not elsewhere classified: Secondary | ICD-10-CM | POA: Diagnosis not present

## 2018-04-22 DIAGNOSIS — R2689 Other abnormalities of gait and mobility: Secondary | ICD-10-CM | POA: Diagnosis not present

## 2018-04-26 DIAGNOSIS — R2689 Other abnormalities of gait and mobility: Secondary | ICD-10-CM | POA: Diagnosis not present

## 2018-04-26 DIAGNOSIS — Z9189 Other specified personal risk factors, not elsewhere classified: Secondary | ICD-10-CM | POA: Diagnosis not present

## 2018-04-26 DIAGNOSIS — M1712 Unilateral primary osteoarthritis, left knee: Secondary | ICD-10-CM | POA: Diagnosis not present

## 2018-04-26 DIAGNOSIS — M1711 Unilateral primary osteoarthritis, right knee: Secondary | ICD-10-CM | POA: Diagnosis not present

## 2018-04-26 DIAGNOSIS — M6281 Muscle weakness (generalized): Secondary | ICD-10-CM | POA: Diagnosis not present

## 2018-05-03 DIAGNOSIS — R2689 Other abnormalities of gait and mobility: Secondary | ICD-10-CM | POA: Diagnosis not present

## 2018-05-03 DIAGNOSIS — M1712 Unilateral primary osteoarthritis, left knee: Secondary | ICD-10-CM | POA: Diagnosis not present

## 2018-05-03 DIAGNOSIS — Z9189 Other specified personal risk factors, not elsewhere classified: Secondary | ICD-10-CM | POA: Diagnosis not present

## 2018-05-03 DIAGNOSIS — M1711 Unilateral primary osteoarthritis, right knee: Secondary | ICD-10-CM | POA: Diagnosis not present

## 2018-05-03 DIAGNOSIS — M6281 Muscle weakness (generalized): Secondary | ICD-10-CM | POA: Diagnosis not present

## 2018-05-07 DIAGNOSIS — Z9189 Other specified personal risk factors, not elsewhere classified: Secondary | ICD-10-CM | POA: Diagnosis not present

## 2018-05-07 DIAGNOSIS — R2689 Other abnormalities of gait and mobility: Secondary | ICD-10-CM | POA: Diagnosis not present

## 2018-05-07 DIAGNOSIS — M1712 Unilateral primary osteoarthritis, left knee: Secondary | ICD-10-CM | POA: Diagnosis not present

## 2018-05-07 DIAGNOSIS — M1711 Unilateral primary osteoarthritis, right knee: Secondary | ICD-10-CM | POA: Diagnosis not present

## 2018-05-07 DIAGNOSIS — M6281 Muscle weakness (generalized): Secondary | ICD-10-CM | POA: Diagnosis not present

## 2018-05-09 DIAGNOSIS — R8290 Unspecified abnormal findings in urine: Secondary | ICD-10-CM | POA: Diagnosis not present

## 2018-06-12 DIAGNOSIS — Z993 Dependence on wheelchair: Secondary | ICD-10-CM | POA: Diagnosis not present

## 2018-06-12 DIAGNOSIS — R2689 Other abnormalities of gait and mobility: Secondary | ICD-10-CM | POA: Diagnosis not present

## 2018-06-12 DIAGNOSIS — F0390 Unspecified dementia without behavioral disturbance: Secondary | ICD-10-CM | POA: Diagnosis not present

## 2018-06-13 ENCOUNTER — Other Ambulatory Visit: Payer: Self-pay

## 2018-06-13 NOTE — Patient Outreach (Signed)
[facility]) Care Management  06/13/2018  Nicole Villa 08-Mar-1922 [id number]  TELEPHONE SCREENING Referral date: 06/13/18 Referral source: Primary MD referral Referral reason: Social worker assistance with end of life planning, hospital bed and  [place] lift.  Telephone call to patient's daughter and designated party release, Nicole Villa regarding primary MD referral. HIPAA verified by daughter for patient. RNCM introduced herself and explained reason for call. Daughter states a case worker from Hospice is coming to their home today to evaluate patient's status.  Daughter states she is not sure if she needs services with [facility] care management at this time. RNCM offered to follow up with daughter after meeting with hospice to determine if services are needed. Daughter verbally agreed to follow up with RNCM within 2 business days.  RNCM provided daughter contact phone number and name requesting update regarding Hospice meeting. Daughter verbalized understanding.    PLAN: RNCm will follow up with patients daughter within 2 business days.  RNCM will send [facility] care management brochure    [name] RN,BSN,CCM [facility] Telephonic  [phone] CM TELEPHONIC

## 2018-06-14 ENCOUNTER — Encounter: Payer: Self-pay | Admitting: Internal Medicine

## 2018-06-14 ENCOUNTER — Other Ambulatory Visit: Payer: Self-pay

## 2018-06-14 ENCOUNTER — Other Ambulatory Visit: Payer: Medicare Other | Admitting: Internal Medicine

## 2018-06-14 DIAGNOSIS — Z515 Encounter for palliative care: Secondary | ICD-10-CM

## 2018-06-14 NOTE — Progress Notes (Signed)
April 17th, 2020 [facility] Palliative Care Consult Note Telephone: [phone]  Fax: [phone]  *Due to the current COVID-19 infection/crises, the patient and family prefer, and have given their verbal consent for, a provider visit from my office. HIPPA policies of confidentially were discussed.  PATIENT NAME: Nicole Villa DOB: 29-Feb-1922 MRN: [id number]  PRIMARY CARE PROVIDER:   [name], MD  REFERRING PROVIDER:  [name], MD [address] [place], [place] [address]  RESPONSIBLE PARTY: (dtr/HCPOA/FPOA) Inetta Fermo Page ([phone]. (dtr) Zella Ball. (spouse) Cushena Hartz ([phone]  RECOMMENDATIONS and PLAN:  1. Functional and Cognitive decline: Pt is bedbound because of severe osteoarthritis of the knees that causes her pain when she attempts to stand. She is eating 100% of her diet and can feed herself without dysphagia. Her weight is stable at 120 lbs and she is 4 9"; BMI: 26 kg/m2. The patient speaks in sentences. She has some mild afternoon confusion but is able to answer questions accurately and appropriately. The patient is occasionally incontinent of urine and stool if there is no one to assist her to the bedside commode. Her skin is friable w/ multiple bruises d/t long standing uses of Plavix.  2, Caregiver support: Pt has 2 full time caregivers who are w/ her from 7A-5:30P and 8P-7A; the daughters rotate evenings w/ her. The patient's daughters hope to obtain a [facility] lift as they are unable to lift her from bed to W/C or BSC. Daughters defer a  hospital bed at present because they feel it would be too upsetting to pts husband.  Inetta Fermo shared feeling overwhelmed with care givng responsibilities. We normalized those feelings. Tina plans to tap into SW counseling services offered by [facility]. [name], RN CCM for [facility] [phone]) had contacted dtr Inetta Fermo on 06/13/18, but deferred intervention pnding a Hospice evaluation  that had been scheduled for later that day (patient did not admit to hospice, as prognosis thought greater than 6 months). Pietro Cassis was consulting to assist with SW counseling, help with end of life planning, and to assist with obtaining DME supplies. I gave dtr Inetta Fermo, Davina's contact number, and Inetta Fermo plans to f/u with Davina on Monday 4/20 (Davina off until then).  3. Goals of care: comfort.  4. Advanced Care Directives: Discussed with HCPOA dtr Inetta Fermo, who requests DNR be completed and mailed to the home. We discussed placing one copy on the fridge, and carrying the other copy with patient if traveling outside the home. We touched upon the specifics of the MOST form; Inetta Fermo is interested in completing this after she has an opportunity to further discuss and review with her sister Zella Ball. I will e-mail this information to her ([email or web address]).  5. Follow up: Call week of May 3rd to schedule telehealth visit to complete MOST form, if family desires. Provide emotional support; consider Palliative Care LCSW consult if daughter doesn't get a chance to tap into SW counseling via [facility].  I spent 60 minutes providing this consultation. More than 50% of the time in this consultation was spent coordinating communication.   HISTORY OF PRESENT ILLNESS:  Nicole Villa is a 85 y.o. female with h/o  dementia, anemia, DJD, OA, TIA, DM, CKD 3, HLD, HTN, GERD, aortic atherosclerosis and overactive bladder. She was evaluated for Hospice services 06/13/2018, but felt not to be eligible for services d/t prognosis > 6 months. Palliative Care was asked to help address goals of care.   CODE STATUS: DNR (completed  form to be mailed to home today  PPS: 30%  HOSPICE ELIGIBILITY/DIAGNOSIS: TBD  PAST MEDICAL HISTORY:  Past Medical History:  Diagnosis Date  . Arthritis   . Diabetes mellitus without complication (HCC)   . Gallstones   . GERD (gastroesophageal reflux disease)   . Heart murmur   . Hyperlipemia   .  Hypertension   . Microcytic anemia 05/07/2012    SOCIAL HX:  Social History   Tobacco Use  . Smoking status: Never Smoker  . Smokeless tobacco: Never Used  Substance Use Topics  . Alcohol use: No    ALLERGIES:  Allergies  Allergen Reactions  . Lisinopril Cough     PERTINENT MEDICATIONS:  Outpatient Encounter Medications as of 06/14/2018  Medication Sig  . acetaminophen (TYLENOL) 325 MG tablet Take 650 mg by mouth at bedtime.  Marland Kitchen. amLODipine (NORVASC) 10 MG tablet Take 10 mg by mouth daily.  . cefpodoxime (VANTIN) 100 MG tablet Take 1 tablet (100 mg total) by mouth 2 (two) times daily. (Patient taking differently: Take 100 mg by mouth daily. )  . cholecalciferol (VITAMIN D) 1000 UNITS tablet Take 1,000 Units by mouth daily.  . clopidogrel (PLAVIX) 75 MG tablet Take 1 tablet (75 mg total) by mouth daily with breakfast.  . fish oil-omega-3 fatty acids 1000 MG capsule Take 1 g by mouth daily.  Marland Kitchen. glimepiride (AMARYL) 1 MG tablet Take 0.5 mg by mouth daily with breakfast. Take a 1/2 tablet with the first meal of the day  . HYDROcodone-acetaminophen (NORCO/VICODIN) 5-325 MG tablet Take 0.5 tablets by mouth 2 (two) times daily.  . irbesartan (AVAPRO) 300 MG tablet Take 1 tablet (300 mg total) by mouth daily.  . nabumetone (RELAFEN) 500 MG tablet Take 500 mg by mouth daily.  . pantoprazole (PROTONIX) 40 MG tablet Take 40 mg by mouth daily.  . pravastatin (PRAVACHOL) 20 MG tablet Take 20 mg by mouth daily.  Marland Kitchen. trimethoprim (TRIMPEX) 100 MG tablet Take 100 mg by mouth daily.  . mirabegron ER (MYRBETRIQ) 50 MG TB24 tablet Take 50 mg by mouth at bedtime. No longer taking  . [DISCONTINUED] cefUROXime (CEFTIN) 250 MG tablet Take 1 tablet (250 mg total) by mouth 2 (two) times daily with a meal. (Patient not taking: Reported on 09/26/2016)  . [DISCONTINUED] memantine (NAMENDA) 10 MG tablet Take 10 mg by mouth 2 (two) times daily.   No facility-administered encounter medications on file as of  06/14/2018.     PHYSICAL EXAM:  Exam deferred; primary conversation with PCP daughter Inetta Fermoina.  Anselm LisMary P Shamariah Shewmake, NP

## 2018-06-17 ENCOUNTER — Other Ambulatory Visit: Payer: Self-pay

## 2018-06-17 ENCOUNTER — Ambulatory Visit: Payer: PRIVATE HEALTH INSURANCE

## 2018-06-17 NOTE — Patient Outreach (Signed)
[facility]) Care Management  06/17/2018  Nicole Villa [birth year] [id number]  TELEPHONE SCREENING Referral date: 06/13/18 Referral source: Primary MD referral Referral reason: Social worker assistance with end of life planning, hospital bed and  [facility] lift.  Telephone call to patients daughter/ designated party release, Inetta Fermo Page.  Daughter states patient does not qualify for hospice at this time. She states she spoke with Corrie Dandy with [facility] who is assisting her with getting patient a [facility] life. Daughter state they decided not to request a hospital bed a this time. Daughter states she would like to have a nurse come out every two weeks to take vital signs. RNCM advised patient to follow up with the palliative care nurse to determine if they are able to accommodate this or contact patients primary care provider and request home health referral.  Daughter states patient has around the clock care. Daughter states patient does not have any further needs at this time.  RNCM provided her name, company name and contact phone number to daughter. Advised daughter to call for any additional questions or if further assistance is needed. Daughter verbalized understanding. RNCM offered to mail [facility] care management brochure.  Mailing address confirmed with daughter.   PLAN; RNCM will close patient due to patient being assessed and having no [facility] care management needs at this time.  RNCM will mail patient [facility] care management brochure/ letter.    [name] RN,BSN,CCM [facility] Telephonic  [phone]

## 2018-06-18 ENCOUNTER — Telehealth: Payer: Self-pay | Admitting: Internal Medicine

## 2018-06-18 ENCOUNTER — Other Ambulatory Visit: Payer: Self-pay

## 2018-06-18 NOTE — Patient Outreach (Signed)
[facility]) Care Management  06/18/2018  JACELLE MACINTOSH [birth year] [id number]  Referral date:06/13/18 Referral source:Primary MD referral Referral reason:Social worker assistance with end of life planning, hospital bed and [facility] lift.  Telephone call to Holly Bodily, Nurse practitioner with [facility]. RNCM introduced herself and discussed her role and [facility] care management services.  Corrie Dandy explained her role and service she will provide to patient.  RNCM will provide follow up call to patients daughter / designated party release Inetta Fermo Page to offer care coordination assistance and  [facility] care management services. RNCM will follow up with patients primary MD to assist with request  for [facility] lift.   PLAN: RNCM will follow up with patients daughter within 3 business days.   [name] RN,BSN,CCM [facility] Telephonic  [phone]

## 2018-06-18 NOTE — Telephone Encounter (Signed)
10:45am:   Spoke to [name], RN CCM for [facility] [phone]), who explained their scope of services. Pietro Cassis reports that she will contact PCP re: [facility] lift, and f/u with daughter Inetta Fermo to check if family is amenable to LCSW consult for supportive counseling setting of serious illness/caregiver stress.  Holly Bodily NP-C [phone]

## 2018-06-19 ENCOUNTER — Ambulatory Visit: Payer: Self-pay

## 2018-06-19 ENCOUNTER — Telehealth: Payer: Self-pay

## 2018-06-19 NOTE — Telephone Encounter (Signed)
Phone call placed to PCP office regarding family request and patient's need for a hoyer lift for transfers. Message left for Amy with call back information.

## 2018-06-20 ENCOUNTER — Other Ambulatory Visit: Payer: Self-pay

## 2018-06-20 ENCOUNTER — Telehealth: Payer: Self-pay | Admitting: Internal Medicine

## 2018-06-20 NOTE — Telephone Encounter (Signed)
I received an e-mail from patient's PCG daughter Inetta Fermo, asking where we were in the process of obtaining the [facility] lift. I let Inetta Fermo know that [name] [facility] CM had made the request to Dr. Alver Fisher office. Tina plans to call Dr. Alver Fisher office to f/u.  Holly Bodily NP-C [phone]

## 2018-06-20 NOTE — Patient Outreach (Signed)
[facility]) Care Management  06/20/2018  SYDNYE HANFT Nov 14, 1922 [id number]  TELEPHONE SCREENING Referral date:06/13/18 Referral source:Primary MD referral Referral reason:Social worker assistance with end of life planning, hospital bed and [facility] lift.  Telephone call to patient's daughter, [name]. HIPAA verified for patient by daughter. RNCM discussed and offered [facility] care management services. Daughter verbalized agreement. Daughter states  Daughter states patient is receiving around the clock care. She reports patient has a caregiver from 7am to 5pm and 8pm to 7am.  Daughter states she and her sister divide alternate time to provide care.  Daughter states patient is still in need of hoyer lift. Daughter states patient is unable to afford her medication, Memantine.  She states patient has stopped taking this medication due to affordability.  Daughter reports patient has had 3 falls within the past 3 months. She denies patient having any injuries only bruising. Daughter states patient has appropriate medical equipment for ambulation. She states patient has a walker but is not using much. She states patient is using a wheelchair more for ambulation.  Daughter states, " she just decides to turn lose and hits the floor. If she doesn't want to stand up she will just sit down."  Daughter states patient has been referred to [facility] care. She states she is confused as to whether patient has qualified for palliative care. RNCM advised daughter to contact Holly Bodily, NP with [facility] to verify palliative care involvement. Daughter verbalized understanding.  RNCM called patients primary MD office and spoke with [facility].  Brook states and order was sent to [facility] for patient and to [facility] for [facility] lift.   RNCM contacted [facility]. Contact made with Tarah who states an order for hoyer lift has been received and of note there is missing documentation.  Brook transferred call to [facility] with [facility]. [name] states a provider NPI number and printed name of provider is missing on prescriptions. [name] states [place] with [facility] has sent a message to patients primary MD office requesting this missing information.   RNCM contacted patients daughter, [name].  Informed her that an order for the [facility] lift was received at [employer]. They are currently waiting for additional information from the primary MD office.  Daughter verbalized understanding and appreciation of RNCM' follow up.    PLAN: RNCM will follow up with patient and/ or daughter within 1 week.  RNCM will send patient's primary MD involvement letter.  RNCM will send patient [facility] care management welcome packet and consent form.   [name] RN,BSN,CCM [facility] Telephonic  [phone]

## 2018-06-25 ENCOUNTER — Other Ambulatory Visit: Payer: Self-pay

## 2018-06-25 NOTE — Patient Outreach (Signed)
[facility]) Care Management  06/25/2018  Nicole Villa [birth year] [id number]  TELEPHONE SCREENING Referral date:06/13/18 Referral source:Primary MD referral Referral reason:Social worker assistance with end of life planning, hospital bed and [facility] lift.  Voice mail message received from patients daughter, [name] stating she has not heard back regarding patients [place] lift.  RNCM called [facility] and spoke with [place].  [place] confirmed receiving physician referral/ order for [employer]. [place] state additional documentation has not been received from patients primary care providers office as requested.  RNCM attempted call to patients primary MD office. Office closed for today.  RNCM contacted patients daughter, [name].  HIPAA verified by daughter for patient.  Informed daughter that contact was made with [facility] and additional information has not been received from primary care provider. Daughter informed RNCM attempted contact with provider office but office was closed. Daughter advised that RNCM will follow up with primary MD office within 1 business days.   PLAN: RNCM will contact patients primary MD office and follow up on DME equipment.   [name] RN,BSN,CCM [facility] Telephonic  [phone]

## 2018-06-26 ENCOUNTER — Other Ambulatory Visit: Payer: Self-pay

## 2018-06-26 NOTE — Patient Outreach (Signed)
[facility]) Care Management  06/26/2018  BRENDALEE BINION Jun 17, 1922 [id number]  TELEPHONE SCREENING Referral date:06/13/18 Referral source:Primary MD referral Referral reason:Social worker assistance with end of life planning, hospital bed and [facility] lift.  Voice mail message received from Amy with Dr. Alver Fisher office.  Amy states she sent order to [facility] for the hoyer lift.  Amy states she will contact [facility] to make sure they received order.  RNCM attempted call to [facility] for follow up. Unable to speak with anyone directly. Unable to leave message. Mailbox was full  PLAN: RNCM will follow up with [facility] and patients daughter within 3 business days.   [name] RN,BSN,CCM [facility] Telephonic  [phone]

## 2018-06-26 NOTE — Patient Outreach (Signed)
[facility]) Care Management  06/26/2018  Nicole Villa February 08, 1923 [id number]  TELEPHONE SCREENING Referral date:06/13/18 Referral source:Primary MD referral Referral reason:Social worker assistance with end of life planning, hospital bed and [facility] lift.  RNCM contacted [facility] and spoke with [place]. [place] confirmed receiving missing information to process referral request for [employer].  [place] states patient's daughter has been contacted and delivery time was coordinated.  RNCM contacted patients daughter, [name].  Daughter confirms [facility] contacted her and will deliver hoyer lift on tomorrow 06/27/18.   Daughter expressed appreciation for follow up calls and assistance.   PLAN; RNCM will follow up with daughter within 1 business day to verify receiving hoyer lift.   [name] RN,BSN,CCM [facility] Telephonic  [phone]

## 2018-06-26 NOTE — Patient Outreach (Signed)
[facility]) Care Management  06/26/2018  Nicole Villa 03-28-1922 [id number]  TELEPHONE SCREENING Referral date:06/13/18 Referral source:Primary MD referral Referral reason:Social worker assistance with end of life planning, hospital bed and [employer] lift.  Telephone call to patients primary MD office. Spoke with [name] and explained that [facility] is missing additional information to process order for [name] lift.  Explained to [name] that NPI number and printed name of provider is missing on prescription per [name] with [facility]. [name] states she will notify the doctor of missing documentation.  PLAN; RNCM will follow up with patients daughter and [facility] within 3 business days.   [name] RN,BSN,CCM [facility] Telephonic  [phone]

## 2018-06-27 NOTE — Telephone Encounter (Signed)
This encounter was created in error - please disregard.

## 2018-07-04 ENCOUNTER — Telehealth: Payer: Self-pay | Admitting: Internal Medicine

## 2018-07-04 NOTE — Telephone Encounter (Signed)
3:15pm:  TC to daughter Inetta Fermo, in follow up for MOST form completion. Inetta Fermo would like to complete this form; requests I call her next week to schedule.  Holly Bodily NP-C [phone]

## 2018-07-09 ENCOUNTER — Other Ambulatory Visit: Payer: Self-pay

## 2018-07-09 NOTE — Patient Outreach (Signed)
[facility]) Care Management  07/09/2018  Nicole Villa 1922-10-17 [id number]   Referral date:06/13/18 Referral source:Primary MD referral Referral reason:Social worker assistance with end of life planning, hospital bed and [facility] lift.  Telephone call to patients daughter, [name]. Unable to reach. HIPAA compliant voice message left with call back phone number.   PLAN; RNCM will attempt 2nd telephone call to patient within 4 business days.   [name] RN,BSN,CCM [facility] Telephonic  [phone]

## 2018-07-11 ENCOUNTER — Telehealth: Payer: Self-pay | Admitting: Internal Medicine

## 2018-07-11 ENCOUNTER — Other Ambulatory Visit: Payer: Self-pay

## 2018-07-11 DIAGNOSIS — R8290 Unspecified abnormal findings in urine: Secondary | ICD-10-CM | POA: Diagnosis not present

## 2018-07-11 NOTE — Patient Outreach (Signed)
[facility]) Care Management  07/11/2018  Nicole Villa Sep 03, 1922 [id number]  Referral date:06/13/18 Referral source:Primary MD referral Referral reason:Social worker assistance with end of life planning, hospital bed and [facility] lift.   Telephone call to patient's daughter, Nicole Villa. HIPAA verified by daughter for patient. Daughter states patient needs the hospital bed now because she has become weaker. Daughter states Dr. Clelia Croft has submitted [facility] to [facility] for the bed.  RNCM advised daughter that she will follow up to assist where care coordination is needed. Daughter voiced appreciation.  RNCM attempted to complete initial assessment today with daughter for patient. Daughter requested RNCM call back on tomorrow to complete assessment.   PLAN: RNCM will attempt telephone outreach with daughter within 1 business day.   [name] RN,BSN,CCM [facility] Telephonic  [phone]

## 2018-07-11 NOTE — Telephone Encounter (Signed)
11:45am: TC to schedule tele-health visit. Apt scheduled for Friday 07/12/2018 at 10am. Invite will be sent to [email or web address]. Holly Bodily NP-C [phone]

## 2018-07-12 ENCOUNTER — Other Ambulatory Visit: Payer: Self-pay

## 2018-07-12 ENCOUNTER — Other Ambulatory Visit: Payer: Medicare Other | Admitting: Internal Medicine

## 2018-07-12 DIAGNOSIS — R8290 Unspecified abnormal findings in urine: Secondary | ICD-10-CM | POA: Diagnosis not present

## 2018-07-12 DIAGNOSIS — F0390 Unspecified dementia without behavioral disturbance: Secondary | ICD-10-CM | POA: Insufficient documentation

## 2018-07-12 NOTE — Patient Outreach (Signed)
[facility]) Care Management  07/12/2018  Nicole Villa 10-19-1922 [id number]  Referral date:06/13/18 Referral source:Primary MD referral Referral reason:Social worker assistance with end of life planning, hospital bed and [facility] lift.   Attempt #1  Telephone call to patients daughter, [name]. HIPAA verified by daughter for patient. Daughter states she is unable to take call at this time and request call back at 4:00pm today.   PLAN: RNCM will attempt 2nd telephone outreach to daughter within 1 business days.   [name] RN,BSN,CCM [facility] Telephonic  [phone]

## 2018-07-12 NOTE — Patient Outreach (Signed)
[facility] Care Management  07/12/2018   Nicole Villa 02/03/1923 [id number]  Subjective:  Telephone call to patient daughter, Nicole Villa.  HIPAA verified by daughter for patient. Patient states patient is continuing to decline. She states patient has a follow up with her primary MD on 07/25/18. Daughter states she hopes she is able to get her there.  Patient states daughter has skin tears to her arms and legs due to her arms and legs occasionally hitting the wheelchair. Daughter states patient is still eating without problems. She states patient is a total care and must be fed.  Daughter states the hospital bed is scheduled for delivery on Monday 07/15/18.  Daughter states patients hoya lift was delivered and has been working well.  Daughter verbally agreed to next telephone outreach with [facility] [place].  RNCM advised patient to notify MD of any changes in condition prior to scheduled appointment. RNCM verified patient aware of 911 services for urgent/ emergent needs.   Objective: see assessment  Current Medications:  Current Outpatient Medications  Medication Sig Dispense Refill  . acetaminophen (TYLENOL) 325 MG tablet Take 650 mg by mouth at bedtime.    Marland Kitchen amLODipine (NORVASC) 10 MG tablet Take 10 mg by mouth daily.    . cefpodoxime (VANTIN) 100 MG tablet Take 1 tablet (100 mg total) by mouth 2 (two) times daily. (Patient taking differently: Take 100 mg by mouth daily. ) 14 tablet 0  . cholecalciferol (VITAMIN D) 1000 UNITS tablet Take 1,000 Units by mouth daily.    . clopidogrel (PLAVIX) 75 MG tablet Take 1 tablet (75 mg total) by mouth daily with breakfast. 30 tablet 1  . fish oil-omega-3 fatty acids 1000 MG capsule Take 1 g by mouth daily.    Marland Kitchen glimepiride (AMARYL) 1 MG tablet Take 0.5 mg by mouth daily with breakfast. Take a 1/2 tablet with the first meal of the day    . HYDROcodone-acetaminophen (NORCO/VICODIN) 5-325 MG tablet Take 0.5 tablets by mouth 2 (two) times daily.     . irbesartan (AVAPRO) 300 MG tablet Take 1 tablet (300 mg total) by mouth daily. 30 tablet 6  . mirabegron ER (MYRBETRIQ) 50 MG TB24 tablet Take 50 mg by mouth at bedtime. No longer taking    . nabumetone (RELAFEN) 500 MG tablet Take 500 mg by mouth daily.    . pantoprazole (PROTONIX) 40 MG tablet Take 40 mg by mouth daily.    . pravastatin (PRAVACHOL) 20 MG tablet Take 20 mg by mouth daily.    Marland Kitchen trimethoprim (TRIMPEX) 100 MG tablet Take 100 mg by mouth daily.  3   No current facility-administered medications for this visit.     Functional Status:  In your present state of health, do you have any difficulty performing the following activities: 07/12/2018  Hearing? N  Vision? N  Difficulty concentrating or making decisions? Y  Walking or climbing stairs? Y  Dressing or bathing? Y  Doing errands, shopping? Y  Preparing Food and eating ? Y  Using the Toilet? Y  In the past six months, have you accidently leaked urine? Y  Do you have problems with loss of bowel control? Y  Managing your Medications? Y  Managing your Finances? Y  Housekeeping or managing your Housekeeping? Y  Some recent data might be hidden    Fall/Depression Screening: Fall Risk  07/12/2018  Falls in the past year? 1  Number falls in past yr: 1  Injury with Fall? 0  Comment daughter states  bruising only  Risk for fall due to : History of fall(s);Impaired mobility;Impaired balance/gait  Risk for fall due to: Comment dementia  Follow up Falls prevention discussed    [facility] CM Care Plan Problem One     Most Recent Value  Care Plan Problem One  knowledge deficit regarding care coordination process  Role Documenting the Problem One  Care Management Telephonic Coordinator  Care Plan for Problem One  Active  [facility] CM Short Term Goal #1   Daughter will verbalize receipt of hoyer lift within 3 weeks  [facility] CM Short Term Goal #1 Start Date  06/20/18  [facility] CM Short Term Goal #1 Met Date  07/12/18  [facility] CM Short Term Goal #2    Daughter will verbalize clarity regarding palliative care services within 1 week  [facility] CM Short Term Goal #2 Start Date  06/20/18  [facility] CM Short Term Goal #2 Met Date  07/12/18  [facility] CM Short Term Goal #3  Daughter will verbalize receiving hospital bed for patient  [facility] CM Short Term Goal #3 Start Date  07/12/18  Interventions for Short Tern Goal #3  Daughter has been advised to follow up up with primary MD and/ or [facility] home care if hospital bed not delivered     No flowsheet data found.  Assessment:  Ongoing follow up with RN care manager  Plan: RNCM will follow up with patient's daughter within 1 week.   Quinn Plowman RN,BSN,CCM [facility] Telephonic  [phone]

## 2018-07-15 ENCOUNTER — Other Ambulatory Visit: Payer: Self-pay

## 2018-07-15 ENCOUNTER — Other Ambulatory Visit: Payer: Medicare Other | Admitting: Internal Medicine

## 2018-07-15 ENCOUNTER — Encounter: Payer: Self-pay | Admitting: Internal Medicine

## 2018-07-15 DIAGNOSIS — Z515 Encounter for palliative care: Secondary | ICD-10-CM | POA: Diagnosis not present

## 2018-07-15 NOTE — Progress Notes (Signed)
May 18th, 2020 [facility] Palliative Care Consult Note Telephone: [phone]  Fax: [phone]  *Due to the current COVID-19 infection/crises, the patient and family prefer, and have given their verbal consent for, a telehealth provider visit from my office. HIPPA policies of confidentially were discussed.  PATIENT NAME: Nicole Villa DOB: 07/05/1922 MRN: [id number]  PRIMARY CARE PROVIDER:   [name], MD  REFERRING PROVIDER:  [name], MD [address] [place], [place] [address]  RESPONSIBLE PARTY: (dtr/HCPOA/FPOA) Inetta Fermo Page ([phone]. (dtr) Zella Ball. (spouse) Tamie Geiger ([phone]  RECOMMENDATIONS and PLAN:  1. Functional and Cognitive decline: Pt continues bedbound because of severe osteoarthritis of the knees that causes her pain when she attempts to stand. She is eating and enjoying 100% of her diet but can no longer feed herself. She is without signs of dysphagia or aspiration. Patient can occasionally stand long enough so others can pull up her pants, or assist her to pivot to a bed side commode, but she is non-ambulatory. Patient has lost weight over these last 6 months, going from a size XL to current size Medium. Her current weight is 120 lbs. At 4 9" her BMI: 26 kg/m2. The patient speaks in sentences, but becomes increasingly confused by mid afternoon, speaking nonsensically. The patient is mostly incontinent of urine and stool. Her skin continues friable w/ multiple bruises d/t long standing uses of Plavix.  -Daughter Inetta Fermo and I discussed benefits vs burdens of continuing Plavix; she is going to check with patient's PCP about possibility of discontinuing this agent.  2, Caregiver support: Pt continues with 2 full time caregivers who are w/ her from 7A-5:30P and 8P-7A; the daughters rotate evenings w/ her. [facility] has assisted patient with obtaining a Hoyer lift, and electric hospital bed. Inetta Fermo is  going to follow up to see if she can obtain a gel mattress pad as well. Inetta Fermo has been undergoing counseling with [name], and feels that this has been very helpful to her.  3. Goals of care: comfort.  4. Advanced Care Directives: DNR in the home. Today Inetta Fermo and I reviewed and completed the MOST form. I downloaded this into [facility] E[facility]) and will mail a hard copy to patient's home,  5. Follow up: Call week of June 22nd to schedule check in telehealth visit. I spent 60 minutes providing this consultation. More than 50% of the time in this consultation was spent coordinating communication.   HISTORY OF PRESENT ILLNESS:  Nicole Villa is a 85 y.o. female with h/o  dementia, anemia, DJD, OA, TIA, DM, CKD 3, HLD, HTN, GERD, aortic atherosclerosis and overactive bladder. She was evaluated for Hospice services 06/13/2018, but felt not to be eligible for services d/t prognosis > 6 months. Palliative Care from 06/14/2018 for assist with addressing goals of care.   CODE STATUS: DNR. MOST: DNR/DNI, Limited additional Interventions, Antibiotics and IVF's if indicated. No feeding tube.   PPS: 30%  HOSPICE ELIGIBILITY/DIAGNOSIS: TBD  PAST MEDICAL HISTORY:  Past Medical History:  Diagnosis Date  . Arthritis   . Diabetes mellitus without complication (HCC)   . Gallstones   . GERD (gastroesophageal reflux disease)   . Heart murmur   . Hyperlipemia   . Hypertension   . Microcytic anemia 05/07/2012    SOCIAL HX:  Social History   Tobacco Use  . Smoking status: Never Smoker  . Smokeless tobacco: Never Used  Substance Use Topics  . Alcohol  use: No    ALLERGIES:  Allergies  Allergen Reactions  . Lisinopril Cough     PERTINENT MEDICATIONS:  Outpatient Encounter Medications as of 07/15/2018  Medication Sig  . acetaminophen (TYLENOL) 325 MG tablet Take 650 mg by mouth at bedtime.  Marland Kitchen. amLODipine (NORVASC) 10 MG tablet Take 10 mg by mouth daily.  . cefpodoxime (VANTIN) 100 MG  tablet Take 1 tablet (100 mg total) by mouth 2 (two) times daily. (Patient taking differently: Take 100 mg by mouth daily. )  . cholecalciferol (VITAMIN D) 1000 UNITS tablet Take 1,000 Units by mouth daily.  . clopidogrel (PLAVIX) 75 MG tablet Take 1 tablet (75 mg total) by mouth daily with breakfast.  . fish oil-omega-3 fatty acids 1000 MG capsule Take 1 g by mouth daily.  Marland Kitchen. glimepiride (AMARYL) 1 MG tablet Take 0.5 mg by mouth daily with breakfast. Take a 1/2 tablet with the first meal of the day  . HYDROcodone-acetaminophen (NORCO/VICODIN) 5-325 MG tablet Take 0.5 tablets by mouth 2 (two) times daily.  . irbesartan (AVAPRO) 300 MG tablet Take 1 tablet (300 mg total) by mouth daily.  . mirabegron ER (MYRBETRIQ) 50 MG TB24 tablet Take 50 mg by mouth at bedtime. No longer taking  . nabumetone (RELAFEN) 500 MG tablet Take 500 mg by mouth daily.  . pantoprazole (PROTONIX) 40 MG tablet Take 40 mg by mouth daily.  . pravastatin (PRAVACHOL) 20 MG tablet Take 20 mg by mouth daily.  Marland Kitchen. trimethoprim (TRIMPEX) 100 MG tablet Take 100 mg by mouth daily.   No facility-administered encounter medications on file as of 07/15/2018.     PHYSICAL EXAM:   Exam deferred; primary conversation with PCP daughter Inetta Fermoina.  Anselm LisMary P Serpe, NP

## 2018-07-16 ENCOUNTER — Other Ambulatory Visit: Payer: Self-pay

## 2018-07-16 ENCOUNTER — Ambulatory Visit: Payer: Self-pay

## 2018-07-16 NOTE — Patient Outreach (Signed)
[facility]) Care Management  07/16/2018  MIRL FYE [birth year] [id number]  Referral date:06/13/18 Referral source:Primary MD referral Referral reason:Social worker assistance with end of life planning, hospital bed and [facility] lift.   Attempt #1  Telephone call to patient's daughter for telephone assessment. Unable to reach. HIPAA compliant voice message left with call back phone number.   PLAN; RNCM will attempt 2nd telephone call to patient within 4 business days.   [name] RN,BSN,CCM [facility] Telephonic  [phone]

## 2018-07-19 ENCOUNTER — Other Ambulatory Visit: Payer: Self-pay

## 2018-07-19 NOTE — Patient Outreach (Signed)
[facility]) Care Management  07/19/2018  Nicole Villa [birth year] [id number]  Referral date:06/13/18 Referral source:Primary MD referral Referral reason:Social worker assistance with end of life planning, hospital bed and [facility] lift.  Call to patient's daughter, Inetta Fermo page for follow up.  HIPAA verified by daughter for patient. Daughter states patient received her hospital bed. Daughter states she wants to know if there is a bed that goes down lower. RNCM advised daughter to follow up with the medical equipment company that delivered bed to determine if that is an option. Daughter states patient continues to have bruising to her skin is she accidentally hits an area on her skin.  Daughter states patient's doctor is aware of this.  Daughter states patient is on blood thinners.  RNCM advised daughter to follow up with patients doctor is this worsens. Daughter verbalized understanding. Daughter states patient has been diagnosed with a urinary tract infection. She states patient started her antibiotic on yesterday.  Daughter asked if a nurse could come out to look at patients skin. RNCM advised daughter that patients primary MD would have to order home health services for a nursing evaluation.   [name] RN,BSN,CCM [facility] Telephonic  [phone]

## 2018-07-29 ENCOUNTER — Encounter: Payer: Self-pay | Admitting: Internal Medicine

## 2018-07-29 ENCOUNTER — Other Ambulatory Visit: Payer: Self-pay

## 2018-07-29 ENCOUNTER — Other Ambulatory Visit: Payer: Medicare Other | Admitting: Internal Medicine

## 2018-07-29 ENCOUNTER — Telehealth: Payer: Self-pay

## 2018-07-29 DIAGNOSIS — Z515 Encounter for palliative care: Secondary | ICD-10-CM

## 2018-07-29 NOTE — Patient Outreach (Signed)
[facility]) Care Management  07/29/2018  Nicole Villa 05/09/1922 [id number]  Referral date:06/13/18 Referral source:Primary MD referral Referral reason:Social worker assistance with end of life planning, hospital bed and [facility] lift.  Attempt #1  Received voice mail message from patients daughter, [name].  Returned call to daughter. Unable to reach. HIPAA complaint voice message left with call back phone number.   PLAN: RNCM will attempt outreach to daughter within 2 business days.  [name] RN,BSN,CCM [facility] Telephonic  [phone]

## 2018-07-29 NOTE — Progress Notes (Signed)
June 1st, 2020 [facility] Palliative Care Consult Note Telephone: [phone]  Fax: [phone]  PATIENT NAME:Nicole Villa DOB:1923-01-26 QIH:[id number]  PRIMARY CARE PROVIDER:Shaw, Rockney Ghee, MD  REFERRING PROVIDER:Shaw, Rockney Ghee, MD [address] [place], [place] [address]  RESPONSIBLE PARTY:(dtr/HCPOA/FPOA) Inetta Fermo Page ([phone]. (dtr) Zella Ball. (spouse) Oleen Okoli ([phone]  RECOMMENDATIONS and PLAN: 1. Functional and Cognitive decline: PCG daughter Inetta Fermo reports patient with increased somnolence over these last 2 months. She maintains eye contact and smiles but decreased spontaneous conversation. Her sentences are now only a few words and off topic. She "reads" the newspaper but without any comprehension. She no longer recognizes her family members. She appears awake only about 4 hours/day (from about 6 hours). She is dependent for transfers, hygiene, dressing, and now needs to be fed (new). She is incontinent of urine and stool. She just completed a course of Cipro for a UTI. She needs much cueing to follow simple commands. She can only transiently stand with heavy assist, and has not been ambulatory for 6 months. She now needs to be propped up to maintain a sitting position (new). Patient has lost weight over these last 6 months, going from a size XL to current size Medium. Her last weight some months ago was 120 lbs. At 4 9 her BMI: 26kg/m2.Since then, PCP reports her extremities are thinner, with progressed muscular/adipose wasting.Patient has arthritic pain with movement of her knees and right shoulder, managed with Narco bid, and Tylenol PM (2 tabs) qhs. Daughter asking for possible augmentation of pain medicine. We discussed side effects of opioids in the elderly, and that as pain seems precipitated with movement and otherwise she is pain free. Since patient is essentially bed bound with little initiative  to move, we might be looking at a good level of pain control at current level. Patient is scheduled to have cortisone injections to her knees tomorrow by Dr. Roselind Messier. Patient's skin is thin and easily bruised with frequent skin tears. Her Plavix was discontinued last week by her PCP. -may utilize acetaminophen 500 mg with Ibuprofen 200 mg bid prn. Keep total acetaminophen dose to under 2-3 gms/day. Daughter plans to start using Voltaren gel to patient's knees.  2, Caregiver support: Pt continues with 2 full time caregivers who are w/ her from 7A-5:30P and 8P-7A. The two daughters rotate evenings 5:30pm - 8 pm. [facility] has assisted patient with obtaining a Hoyer lift, and electric hospital bed. Inetta Fermo is going to follow up to see if she can obtain a new bed side commode (current one without a back). Inetta Fermo shares feeling overwhelmed; she just lost her job of many years, and with that a sense of her identity. She is grieving over the loss of the essence of her parents, as they progress in their age and dementia. I provided some supportive counseling, and normalized her feelings.  3. Goals of care: comfort; non-aggressive care. Daughter requesting hospice evaluation for patient.  4. Advanced Care Directives: DNR and MOST form in the home.   5. Follow up: Hospice referral. If not admitted to hospice services will schedule Palliative Care f/u in 4 weeks.  I spent41minutes providing this consultation. More than 50% of the time in this consultation was spent coordinating communication.   HISTORY OF PRESENT ILLNESS:Nicole M Southardis a 85 y.o.femalewith h/odementia, anemia, DJD, OA, TIA, DM, CKD 3, HLD, HTN, GERD, aortic atherosclerosis and overactive bladder.She was evaluated for Hospice services 06/13/2018, but felt not to be  eligible for services d/t prognosis > 6 months.Palliative Care from 07/15/2018 for assist with addressing goals of care.   CODE STATUS:DNR. MOST: DNR/DNI, Limited additional  Interventions, Antibiotics and IVF's if indicated. No feeding tube.   PPS:30%  HOSPICE ELIGIBILITY/DIAGNOSIS:TBD  PAST MEDICAL HISTORY:  Past Medical History:  Diagnosis Date   Arthritis    Diabetes mellitus without complication (HCC)    Gallstones    GERD (gastroesophageal reflux disease)    Heart murmur    Hyperlipemia    Hypertension    Microcytic anemia 05/07/2012    SOCIAL HX:  Social History   Tobacco Use   Smoking status: Never Smoker   Smokeless tobacco: Never Used  Substance Use Topics   Alcohol use: No    ALLERGIES:  Allergies  Allergen Reactions   Lisinopril Cough     PERTINENT MEDICATIONS:  Outpatient Encounter Medications as of 07/29/2018  Medication Sig   acetaminophen (TYLENOL) 325 MG tablet Take 650 mg by mouth at bedtime.   amLODipine (NORVASC) 10 MG tablet Take 10 mg by mouth daily.   diclofenac sodium (VOLTAREN) 1 % GEL Apply topically 4 (four) times daily.   glimepiride (AMARYL) 1 MG tablet Take 0.5 mg by mouth daily with breakfast. Take a 1/2 tablet with the first meal of the day   HYDROcodone-acetaminophen (NORCO/VICODIN) 5-325 MG tablet Take 0.5 tablets by mouth 2 (two) times daily.   nabumetone (RELAFEN) 500 MG tablet Take 500 mg by mouth daily.   pantoprazole (PROTONIX) 40 MG tablet Take 40 mg by mouth daily.   pravastatin (PRAVACHOL) 20 MG tablet Take 20 mg by mouth daily.   valsartan (DIOVAN) 160 MG tablet Take 80 mg by mouth daily. 80mg  (1/2 of a 160 mg tab) once a day   cholecalciferol (VITAMIN D) 1000 UNITS tablet Take 1,000 Units by mouth daily.   fish oil-omega-3 fatty acids 1000 MG capsule Take 1 g by mouth daily.   mirabegron ER (MYRBETRIQ) 50 MG TB24 tablet Take 50 mg by mouth at bedtime. No longer taking   [DISCONTINUED] cefpodoxime (VANTIN) 100 MG tablet Take 1 tablet (100 mg total) by mouth 2 (two) times daily. (Patient taking differently: Take 100 mg by mouth daily. )   [DISCONTINUED] clopidogrel  (PLAVIX) 75 MG tablet Take 1 tablet (75 mg total) by mouth daily with breakfast.   [DISCONTINUED] irbesartan (AVAPRO) 300 MG tablet Take 1 tablet (300 mg total) by mouth daily.   [DISCONTINUED] trimethoprim (TRIMPEX) 100 MG tablet Take 100 mg by mouth daily.   No facility-administered encounter medications on file as of 07/29/2018.     PHYSICAL EXAM:   General: NAD, frail appearing, thin Cardiovascular: regular rate and rhythm Pulmonary: clear ant fields Abdomen: soft, nontender, + bowel sounds GU: no suprapubic tenderness Extremities: no edema, no joint deformities Skin: no rashes. Bruising over upper and lower extremities. She has some horizontal, brownish, non raised, non-inflamed,  lines across the top of her scalp Neurological: Weakness but otherwise nonfocal  Anselm LisMary P Joana Nolton, NP

## 2018-07-29 NOTE — Telephone Encounter (Signed)
Received message to call patient's daughter. Phone call placed to daughter requesting visit with NP. Scheduled for 07/29/2018 @ 4pm

## 2018-07-30 ENCOUNTER — Encounter: Payer: Self-pay | Admitting: Internal Medicine

## 2018-07-30 ENCOUNTER — Telehealth: Payer: Self-pay | Admitting: Internal Medicine

## 2018-07-30 ENCOUNTER — Ambulatory Visit: Payer: Self-pay

## 2018-07-30 NOTE — Telephone Encounter (Signed)
12:15pm: Return TC form Nicole Villa (Dr. Alver Fisher office), who relayed that Dr. Clelia Croft approved Hospice referral, is agreeable to continue on as acting attending, and wishes to activate prn Hospice Care orders.  Holly Bodily NP-C [phone]

## 2018-07-30 NOTE — Telephone Encounter (Signed)
10:30am:  TC to office of PCP Dr. [name]; message left requesting consideration of patient referral to hospice services. I left message and my contact information with office staff. Holly Bodily NP-C [phone]

## 2018-07-31 ENCOUNTER — Other Ambulatory Visit: Payer: Self-pay

## 2018-07-31 DIAGNOSIS — M25562 Pain in left knee: Secondary | ICD-10-CM | POA: Diagnosis not present

## 2018-07-31 DIAGNOSIS — M170 Bilateral primary osteoarthritis of knee: Secondary | ICD-10-CM | POA: Diagnosis not present

## 2018-07-31 DIAGNOSIS — M25561 Pain in right knee: Secondary | ICD-10-CM | POA: Diagnosis not present

## 2018-07-31 NOTE — Patient Outreach (Signed)
[facility]) Care Management  07/31/2018  Nicole Villa Oct 06, 1922 [id number]  Referral date:06/13/18 Referral source:Primary MD referral Referral reason:Social worker assistance with end of life planning, hospital bed and [facility] lift.  Telephone call to patients daughter, [name]. Daughter verified HIPAA for patient.  Daughter states Holly Bodily with palliative care made a home visit with patient on 07/29/18.  Daughter states patient has qualified for hospice. She states she is waiting for a phone call back from hospice today. Daughter states patient needs a bed side commode with a back.  RNCM explained to daughter if patient receives hospice services hospice can provide the needed equipment. Daughter received call from hospice.  Daughter states she will have to talk with RNCM at a later time.   PLAN: RNCM will attempt 2nd telephone outreach to daughter within 2 business days.   [name] RN,BSN,CCM [facility] Telephonic  [phone]

## 2018-08-01 ENCOUNTER — Ambulatory Visit: Payer: Self-pay

## 2018-08-01 DIAGNOSIS — R32 Unspecified urinary incontinence: Secondary | ICD-10-CM | POA: Diagnosis not present

## 2018-08-01 DIAGNOSIS — E785 Hyperlipidemia, unspecified: Secondary | ICD-10-CM | POA: Diagnosis not present

## 2018-08-01 DIAGNOSIS — I700 Atherosclerosis of aorta: Secondary | ICD-10-CM | POA: Diagnosis not present

## 2018-08-01 DIAGNOSIS — K219 Gastro-esophageal reflux disease without esophagitis: Secondary | ICD-10-CM | POA: Diagnosis not present

## 2018-08-01 DIAGNOSIS — R159 Full incontinence of feces: Secondary | ICD-10-CM | POA: Diagnosis not present

## 2018-08-01 DIAGNOSIS — N183 Chronic kidney disease, stage 3 (moderate): Secondary | ICD-10-CM | POA: Diagnosis not present

## 2018-08-01 DIAGNOSIS — F0151 Vascular dementia with behavioral disturbance: Secondary | ICD-10-CM | POA: Diagnosis not present

## 2018-08-01 DIAGNOSIS — D631 Anemia in chronic kidney disease: Secondary | ICD-10-CM | POA: Diagnosis not present

## 2018-08-01 DIAGNOSIS — E1122 Type 2 diabetes mellitus with diabetic chronic kidney disease: Secondary | ICD-10-CM | POA: Diagnosis not present

## 2018-08-01 DIAGNOSIS — I679 Cerebrovascular disease, unspecified: Secondary | ICD-10-CM | POA: Diagnosis not present

## 2018-08-01 DIAGNOSIS — Z8673 Personal history of transient ischemic attack (TIA), and cerebral infarction without residual deficits: Secondary | ICD-10-CM | POA: Diagnosis not present

## 2018-08-01 DIAGNOSIS — I129 Hypertensive chronic kidney disease with stage 1 through stage 4 chronic kidney disease, or unspecified chronic kidney disease: Secondary | ICD-10-CM | POA: Diagnosis not present

## 2018-08-01 DIAGNOSIS — Z87440 Personal history of urinary (tract) infections: Secondary | ICD-10-CM | POA: Diagnosis not present

## 2018-08-01 DIAGNOSIS — M170 Bilateral primary osteoarthritis of knee: Secondary | ICD-10-CM | POA: Diagnosis not present

## 2018-08-02 ENCOUNTER — Other Ambulatory Visit: Payer: Self-pay

## 2018-08-13 DIAGNOSIS — I129 Hypertensive chronic kidney disease with stage 1 through stage 4 chronic kidney disease, or unspecified chronic kidney disease: Secondary | ICD-10-CM | POA: Diagnosis not present

## 2018-08-13 DIAGNOSIS — F0151 Vascular dementia with behavioral disturbance: Secondary | ICD-10-CM | POA: Diagnosis not present

## 2018-08-13 DIAGNOSIS — E1122 Type 2 diabetes mellitus with diabetic chronic kidney disease: Secondary | ICD-10-CM | POA: Diagnosis not present

## 2018-08-13 DIAGNOSIS — N183 Chronic kidney disease, stage 3 (moderate): Secondary | ICD-10-CM | POA: Diagnosis not present

## 2018-08-13 DIAGNOSIS — I679 Cerebrovascular disease, unspecified: Secondary | ICD-10-CM | POA: Diagnosis not present

## 2018-08-13 DIAGNOSIS — D631 Anemia in chronic kidney disease: Secondary | ICD-10-CM | POA: Diagnosis not present

## 2018-08-14 DIAGNOSIS — F0151 Vascular dementia with behavioral disturbance: Secondary | ICD-10-CM | POA: Diagnosis not present

## 2018-08-14 DIAGNOSIS — N183 Chronic kidney disease, stage 3 (moderate): Secondary | ICD-10-CM | POA: Diagnosis not present

## 2018-08-14 DIAGNOSIS — E1122 Type 2 diabetes mellitus with diabetic chronic kidney disease: Secondary | ICD-10-CM | POA: Diagnosis not present

## 2018-08-14 DIAGNOSIS — I129 Hypertensive chronic kidney disease with stage 1 through stage 4 chronic kidney disease, or unspecified chronic kidney disease: Secondary | ICD-10-CM | POA: Diagnosis not present

## 2018-08-14 DIAGNOSIS — D631 Anemia in chronic kidney disease: Secondary | ICD-10-CM | POA: Diagnosis not present

## 2018-08-14 DIAGNOSIS — I679 Cerebrovascular disease, unspecified: Secondary | ICD-10-CM | POA: Diagnosis not present

## 2018-08-21 DIAGNOSIS — E1122 Type 2 diabetes mellitus with diabetic chronic kidney disease: Secondary | ICD-10-CM | POA: Diagnosis not present

## 2018-08-21 DIAGNOSIS — I129 Hypertensive chronic kidney disease with stage 1 through stage 4 chronic kidney disease, or unspecified chronic kidney disease: Secondary | ICD-10-CM | POA: Diagnosis not present

## 2018-08-21 DIAGNOSIS — F0151 Vascular dementia with behavioral disturbance: Secondary | ICD-10-CM | POA: Diagnosis not present

## 2018-08-21 DIAGNOSIS — N183 Chronic kidney disease, stage 3 (moderate): Secondary | ICD-10-CM | POA: Diagnosis not present

## 2018-08-21 DIAGNOSIS — D631 Anemia in chronic kidney disease: Secondary | ICD-10-CM | POA: Diagnosis not present

## 2018-08-21 DIAGNOSIS — I679 Cerebrovascular disease, unspecified: Secondary | ICD-10-CM | POA: Diagnosis not present

## 2018-08-27 DIAGNOSIS — E1122 Type 2 diabetes mellitus with diabetic chronic kidney disease: Secondary | ICD-10-CM | POA: Diagnosis not present

## 2018-08-27 DIAGNOSIS — F0151 Vascular dementia with behavioral disturbance: Secondary | ICD-10-CM | POA: Diagnosis not present

## 2018-08-27 DIAGNOSIS — I129 Hypertensive chronic kidney disease with stage 1 through stage 4 chronic kidney disease, or unspecified chronic kidney disease: Secondary | ICD-10-CM | POA: Diagnosis not present

## 2018-08-27 DIAGNOSIS — D631 Anemia in chronic kidney disease: Secondary | ICD-10-CM | POA: Diagnosis not present

## 2018-08-27 DIAGNOSIS — N183 Chronic kidney disease, stage 3 (moderate): Secondary | ICD-10-CM | POA: Diagnosis not present

## 2018-08-27 DIAGNOSIS — I679 Cerebrovascular disease, unspecified: Secondary | ICD-10-CM | POA: Diagnosis not present

## 2018-08-28 DIAGNOSIS — N183 Chronic kidney disease, stage 3 (moderate): Secondary | ICD-10-CM | POA: Diagnosis not present

## 2018-08-28 DIAGNOSIS — Z8673 Personal history of transient ischemic attack (TIA), and cerebral infarction without residual deficits: Secondary | ICD-10-CM | POA: Diagnosis not present

## 2018-08-28 DIAGNOSIS — M170 Bilateral primary osteoarthritis of knee: Secondary | ICD-10-CM | POA: Diagnosis not present

## 2018-08-28 DIAGNOSIS — R32 Unspecified urinary incontinence: Secondary | ICD-10-CM | POA: Diagnosis not present

## 2018-08-28 DIAGNOSIS — E1122 Type 2 diabetes mellitus with diabetic chronic kidney disease: Secondary | ICD-10-CM | POA: Diagnosis not present

## 2018-08-28 DIAGNOSIS — I679 Cerebrovascular disease, unspecified: Secondary | ICD-10-CM | POA: Diagnosis not present

## 2018-08-28 DIAGNOSIS — D631 Anemia in chronic kidney disease: Secondary | ICD-10-CM | POA: Diagnosis not present

## 2018-08-28 DIAGNOSIS — R159 Full incontinence of feces: Secondary | ICD-10-CM | POA: Diagnosis not present

## 2018-08-28 DIAGNOSIS — Z87440 Personal history of urinary (tract) infections: Secondary | ICD-10-CM | POA: Diagnosis not present

## 2018-08-28 DIAGNOSIS — E785 Hyperlipidemia, unspecified: Secondary | ICD-10-CM | POA: Diagnosis not present

## 2018-08-28 DIAGNOSIS — F0151 Vascular dementia with behavioral disturbance: Secondary | ICD-10-CM | POA: Diagnosis not present

## 2018-08-28 DIAGNOSIS — I700 Atherosclerosis of aorta: Secondary | ICD-10-CM | POA: Diagnosis not present

## 2018-08-28 DIAGNOSIS — I129 Hypertensive chronic kidney disease with stage 1 through stage 4 chronic kidney disease, or unspecified chronic kidney disease: Secondary | ICD-10-CM | POA: Diagnosis not present

## 2018-08-28 DIAGNOSIS — K219 Gastro-esophageal reflux disease without esophagitis: Secondary | ICD-10-CM | POA: Diagnosis not present

## 2018-08-29 DIAGNOSIS — I679 Cerebrovascular disease, unspecified: Secondary | ICD-10-CM | POA: Diagnosis not present

## 2018-08-29 DIAGNOSIS — E1122 Type 2 diabetes mellitus with diabetic chronic kidney disease: Secondary | ICD-10-CM | POA: Diagnosis not present

## 2018-08-29 DIAGNOSIS — F0151 Vascular dementia with behavioral disturbance: Secondary | ICD-10-CM | POA: Diagnosis not present

## 2018-08-29 DIAGNOSIS — I129 Hypertensive chronic kidney disease with stage 1 through stage 4 chronic kidney disease, or unspecified chronic kidney disease: Secondary | ICD-10-CM | POA: Diagnosis not present

## 2018-08-29 DIAGNOSIS — N183 Chronic kidney disease, stage 3 (moderate): Secondary | ICD-10-CM | POA: Diagnosis not present

## 2018-08-29 DIAGNOSIS — D631 Anemia in chronic kidney disease: Secondary | ICD-10-CM | POA: Diagnosis not present

## 2018-09-02 DIAGNOSIS — D631 Anemia in chronic kidney disease: Secondary | ICD-10-CM | POA: Diagnosis not present

## 2018-09-02 DIAGNOSIS — I679 Cerebrovascular disease, unspecified: Secondary | ICD-10-CM | POA: Diagnosis not present

## 2018-09-02 DIAGNOSIS — N183 Chronic kidney disease, stage 3 (moderate): Secondary | ICD-10-CM | POA: Diagnosis not present

## 2018-09-02 DIAGNOSIS — F0151 Vascular dementia with behavioral disturbance: Secondary | ICD-10-CM | POA: Diagnosis not present

## 2018-09-02 DIAGNOSIS — I129 Hypertensive chronic kidney disease with stage 1 through stage 4 chronic kidney disease, or unspecified chronic kidney disease: Secondary | ICD-10-CM | POA: Diagnosis not present

## 2018-09-02 DIAGNOSIS — E1122 Type 2 diabetes mellitus with diabetic chronic kidney disease: Secondary | ICD-10-CM | POA: Diagnosis not present

## 2018-09-03 DIAGNOSIS — I129 Hypertensive chronic kidney disease with stage 1 through stage 4 chronic kidney disease, or unspecified chronic kidney disease: Secondary | ICD-10-CM | POA: Diagnosis not present

## 2018-09-03 DIAGNOSIS — I679 Cerebrovascular disease, unspecified: Secondary | ICD-10-CM | POA: Diagnosis not present

## 2018-09-03 DIAGNOSIS — F0151 Vascular dementia with behavioral disturbance: Secondary | ICD-10-CM | POA: Diagnosis not present

## 2018-09-03 DIAGNOSIS — D631 Anemia in chronic kidney disease: Secondary | ICD-10-CM | POA: Diagnosis not present

## 2018-09-03 DIAGNOSIS — N183 Chronic kidney disease, stage 3 (moderate): Secondary | ICD-10-CM | POA: Diagnosis not present

## 2018-09-03 DIAGNOSIS — E1122 Type 2 diabetes mellitus with diabetic chronic kidney disease: Secondary | ICD-10-CM | POA: Diagnosis not present

## 2018-09-05 DIAGNOSIS — E1122 Type 2 diabetes mellitus with diabetic chronic kidney disease: Secondary | ICD-10-CM | POA: Diagnosis not present

## 2018-09-05 DIAGNOSIS — I679 Cerebrovascular disease, unspecified: Secondary | ICD-10-CM | POA: Diagnosis not present

## 2018-09-05 DIAGNOSIS — D631 Anemia in chronic kidney disease: Secondary | ICD-10-CM | POA: Diagnosis not present

## 2018-09-05 DIAGNOSIS — I129 Hypertensive chronic kidney disease with stage 1 through stage 4 chronic kidney disease, or unspecified chronic kidney disease: Secondary | ICD-10-CM | POA: Diagnosis not present

## 2018-09-05 DIAGNOSIS — F0151 Vascular dementia with behavioral disturbance: Secondary | ICD-10-CM | POA: Diagnosis not present

## 2018-09-05 DIAGNOSIS — N183 Chronic kidney disease, stage 3 (moderate): Secondary | ICD-10-CM | POA: Diagnosis not present

## 2018-09-09 DIAGNOSIS — I679 Cerebrovascular disease, unspecified: Secondary | ICD-10-CM | POA: Diagnosis not present

## 2018-09-09 DIAGNOSIS — E1122 Type 2 diabetes mellitus with diabetic chronic kidney disease: Secondary | ICD-10-CM | POA: Diagnosis not present

## 2018-09-09 DIAGNOSIS — I129 Hypertensive chronic kidney disease with stage 1 through stage 4 chronic kidney disease, or unspecified chronic kidney disease: Secondary | ICD-10-CM | POA: Diagnosis not present

## 2018-09-09 DIAGNOSIS — F0151 Vascular dementia with behavioral disturbance: Secondary | ICD-10-CM | POA: Diagnosis not present

## 2018-09-09 DIAGNOSIS — N183 Chronic kidney disease, stage 3 (moderate): Secondary | ICD-10-CM | POA: Diagnosis not present

## 2018-09-09 DIAGNOSIS — D631 Anemia in chronic kidney disease: Secondary | ICD-10-CM | POA: Diagnosis not present

## 2018-09-10 DIAGNOSIS — F0151 Vascular dementia with behavioral disturbance: Secondary | ICD-10-CM | POA: Diagnosis not present

## 2018-09-10 DIAGNOSIS — E1122 Type 2 diabetes mellitus with diabetic chronic kidney disease: Secondary | ICD-10-CM | POA: Diagnosis not present

## 2018-09-10 DIAGNOSIS — I129 Hypertensive chronic kidney disease with stage 1 through stage 4 chronic kidney disease, or unspecified chronic kidney disease: Secondary | ICD-10-CM | POA: Diagnosis not present

## 2018-09-10 DIAGNOSIS — D631 Anemia in chronic kidney disease: Secondary | ICD-10-CM | POA: Diagnosis not present

## 2018-09-10 DIAGNOSIS — N183 Chronic kidney disease, stage 3 (moderate): Secondary | ICD-10-CM | POA: Diagnosis not present

## 2018-09-10 DIAGNOSIS — I679 Cerebrovascular disease, unspecified: Secondary | ICD-10-CM | POA: Diagnosis not present

## 2018-09-12 DIAGNOSIS — E1122 Type 2 diabetes mellitus with diabetic chronic kidney disease: Secondary | ICD-10-CM | POA: Diagnosis not present

## 2018-09-12 DIAGNOSIS — N183 Chronic kidney disease, stage 3 (moderate): Secondary | ICD-10-CM | POA: Diagnosis not present

## 2018-09-12 DIAGNOSIS — F0151 Vascular dementia with behavioral disturbance: Secondary | ICD-10-CM | POA: Diagnosis not present

## 2018-09-12 DIAGNOSIS — I129 Hypertensive chronic kidney disease with stage 1 through stage 4 chronic kidney disease, or unspecified chronic kidney disease: Secondary | ICD-10-CM | POA: Diagnosis not present

## 2018-09-12 DIAGNOSIS — I679 Cerebrovascular disease, unspecified: Secondary | ICD-10-CM | POA: Diagnosis not present

## 2018-09-12 DIAGNOSIS — D631 Anemia in chronic kidney disease: Secondary | ICD-10-CM | POA: Diagnosis not present

## 2018-09-16 DIAGNOSIS — D631 Anemia in chronic kidney disease: Secondary | ICD-10-CM | POA: Diagnosis not present

## 2018-09-16 DIAGNOSIS — N183 Chronic kidney disease, stage 3 (moderate): Secondary | ICD-10-CM | POA: Diagnosis not present

## 2018-09-16 DIAGNOSIS — F0151 Vascular dementia with behavioral disturbance: Secondary | ICD-10-CM | POA: Diagnosis not present

## 2018-09-16 DIAGNOSIS — E1122 Type 2 diabetes mellitus with diabetic chronic kidney disease: Secondary | ICD-10-CM | POA: Diagnosis not present

## 2018-09-16 DIAGNOSIS — I129 Hypertensive chronic kidney disease with stage 1 through stage 4 chronic kidney disease, or unspecified chronic kidney disease: Secondary | ICD-10-CM | POA: Diagnosis not present

## 2018-09-16 DIAGNOSIS — I679 Cerebrovascular disease, unspecified: Secondary | ICD-10-CM | POA: Diagnosis not present

## 2018-09-19 DIAGNOSIS — F0151 Vascular dementia with behavioral disturbance: Secondary | ICD-10-CM | POA: Diagnosis not present

## 2018-09-19 DIAGNOSIS — I679 Cerebrovascular disease, unspecified: Secondary | ICD-10-CM | POA: Diagnosis not present

## 2018-09-19 DIAGNOSIS — E1122 Type 2 diabetes mellitus with diabetic chronic kidney disease: Secondary | ICD-10-CM | POA: Diagnosis not present

## 2018-09-19 DIAGNOSIS — I129 Hypertensive chronic kidney disease with stage 1 through stage 4 chronic kidney disease, or unspecified chronic kidney disease: Secondary | ICD-10-CM | POA: Diagnosis not present

## 2018-09-19 DIAGNOSIS — D631 Anemia in chronic kidney disease: Secondary | ICD-10-CM | POA: Diagnosis not present

## 2018-09-19 DIAGNOSIS — N183 Chronic kidney disease, stage 3 (moderate): Secondary | ICD-10-CM | POA: Diagnosis not present

## 2018-09-23 DIAGNOSIS — I129 Hypertensive chronic kidney disease with stage 1 through stage 4 chronic kidney disease, or unspecified chronic kidney disease: Secondary | ICD-10-CM | POA: Diagnosis not present

## 2018-09-23 DIAGNOSIS — E1122 Type 2 diabetes mellitus with diabetic chronic kidney disease: Secondary | ICD-10-CM | POA: Diagnosis not present

## 2018-09-23 DIAGNOSIS — F0151 Vascular dementia with behavioral disturbance: Secondary | ICD-10-CM | POA: Diagnosis not present

## 2018-09-23 DIAGNOSIS — N183 Chronic kidney disease, stage 3 (moderate): Secondary | ICD-10-CM | POA: Diagnosis not present

## 2018-09-23 DIAGNOSIS — D631 Anemia in chronic kidney disease: Secondary | ICD-10-CM | POA: Diagnosis not present

## 2018-09-23 DIAGNOSIS — I679 Cerebrovascular disease, unspecified: Secondary | ICD-10-CM | POA: Diagnosis not present

## 2018-09-26 DIAGNOSIS — I129 Hypertensive chronic kidney disease with stage 1 through stage 4 chronic kidney disease, or unspecified chronic kidney disease: Secondary | ICD-10-CM | POA: Diagnosis not present

## 2018-09-26 DIAGNOSIS — F0151 Vascular dementia with behavioral disturbance: Secondary | ICD-10-CM | POA: Diagnosis not present

## 2018-09-26 DIAGNOSIS — N183 Chronic kidney disease, stage 3 (moderate): Secondary | ICD-10-CM | POA: Diagnosis not present

## 2018-09-26 DIAGNOSIS — E1122 Type 2 diabetes mellitus with diabetic chronic kidney disease: Secondary | ICD-10-CM | POA: Diagnosis not present

## 2018-09-26 DIAGNOSIS — D631 Anemia in chronic kidney disease: Secondary | ICD-10-CM | POA: Diagnosis not present

## 2018-09-26 DIAGNOSIS — I679 Cerebrovascular disease, unspecified: Secondary | ICD-10-CM | POA: Diagnosis not present

## 2018-09-28 DIAGNOSIS — Z8673 Personal history of transient ischemic attack (TIA), and cerebral infarction without residual deficits: Secondary | ICD-10-CM | POA: Diagnosis not present

## 2018-09-28 DIAGNOSIS — R32 Unspecified urinary incontinence: Secondary | ICD-10-CM | POA: Diagnosis not present

## 2018-09-28 DIAGNOSIS — I700 Atherosclerosis of aorta: Secondary | ICD-10-CM | POA: Diagnosis not present

## 2018-09-28 DIAGNOSIS — I679 Cerebrovascular disease, unspecified: Secondary | ICD-10-CM | POA: Diagnosis not present

## 2018-09-28 DIAGNOSIS — K219 Gastro-esophageal reflux disease without esophagitis: Secondary | ICD-10-CM | POA: Diagnosis not present

## 2018-09-28 DIAGNOSIS — E1122 Type 2 diabetes mellitus with diabetic chronic kidney disease: Secondary | ICD-10-CM | POA: Diagnosis not present

## 2018-09-28 DIAGNOSIS — I129 Hypertensive chronic kidney disease with stage 1 through stage 4 chronic kidney disease, or unspecified chronic kidney disease: Secondary | ICD-10-CM | POA: Diagnosis not present

## 2018-09-28 DIAGNOSIS — D631 Anemia in chronic kidney disease: Secondary | ICD-10-CM | POA: Diagnosis not present

## 2018-09-28 DIAGNOSIS — E785 Hyperlipidemia, unspecified: Secondary | ICD-10-CM | POA: Diagnosis not present

## 2018-09-28 DIAGNOSIS — Z87440 Personal history of urinary (tract) infections: Secondary | ICD-10-CM | POA: Diagnosis not present

## 2018-09-28 DIAGNOSIS — R159 Full incontinence of feces: Secondary | ICD-10-CM | POA: Diagnosis not present

## 2018-09-28 DIAGNOSIS — N183 Chronic kidney disease, stage 3 (moderate): Secondary | ICD-10-CM | POA: Diagnosis not present

## 2018-09-28 DIAGNOSIS — M170 Bilateral primary osteoarthritis of knee: Secondary | ICD-10-CM | POA: Diagnosis not present

## 2018-09-28 DIAGNOSIS — F0151 Vascular dementia with behavioral disturbance: Secondary | ICD-10-CM | POA: Diagnosis not present

## 2018-09-30 DIAGNOSIS — F0151 Vascular dementia with behavioral disturbance: Secondary | ICD-10-CM | POA: Diagnosis not present

## 2018-09-30 DIAGNOSIS — I679 Cerebrovascular disease, unspecified: Secondary | ICD-10-CM | POA: Diagnosis not present

## 2018-09-30 DIAGNOSIS — N183 Chronic kidney disease, stage 3 (moderate): Secondary | ICD-10-CM | POA: Diagnosis not present

## 2018-09-30 DIAGNOSIS — E1122 Type 2 diabetes mellitus with diabetic chronic kidney disease: Secondary | ICD-10-CM | POA: Diagnosis not present

## 2018-09-30 DIAGNOSIS — I129 Hypertensive chronic kidney disease with stage 1 through stage 4 chronic kidney disease, or unspecified chronic kidney disease: Secondary | ICD-10-CM | POA: Diagnosis not present

## 2018-09-30 DIAGNOSIS — D631 Anemia in chronic kidney disease: Secondary | ICD-10-CM | POA: Diagnosis not present

## 2018-10-03 DIAGNOSIS — I679 Cerebrovascular disease, unspecified: Secondary | ICD-10-CM | POA: Diagnosis not present

## 2018-10-03 DIAGNOSIS — E1122 Type 2 diabetes mellitus with diabetic chronic kidney disease: Secondary | ICD-10-CM | POA: Diagnosis not present

## 2018-10-03 DIAGNOSIS — D631 Anemia in chronic kidney disease: Secondary | ICD-10-CM | POA: Diagnosis not present

## 2018-10-03 DIAGNOSIS — I129 Hypertensive chronic kidney disease with stage 1 through stage 4 chronic kidney disease, or unspecified chronic kidney disease: Secondary | ICD-10-CM | POA: Diagnosis not present

## 2018-10-03 DIAGNOSIS — F0151 Vascular dementia with behavioral disturbance: Secondary | ICD-10-CM | POA: Diagnosis not present

## 2018-10-03 DIAGNOSIS — N183 Chronic kidney disease, stage 3 (moderate): Secondary | ICD-10-CM | POA: Diagnosis not present

## 2018-10-07 DIAGNOSIS — I129 Hypertensive chronic kidney disease with stage 1 through stage 4 chronic kidney disease, or unspecified chronic kidney disease: Secondary | ICD-10-CM | POA: Diagnosis not present

## 2018-10-07 DIAGNOSIS — I679 Cerebrovascular disease, unspecified: Secondary | ICD-10-CM | POA: Diagnosis not present

## 2018-10-07 DIAGNOSIS — E1122 Type 2 diabetes mellitus with diabetic chronic kidney disease: Secondary | ICD-10-CM | POA: Diagnosis not present

## 2018-10-07 DIAGNOSIS — F0151 Vascular dementia with behavioral disturbance: Secondary | ICD-10-CM | POA: Diagnosis not present

## 2018-10-07 DIAGNOSIS — N183 Chronic kidney disease, stage 3 (moderate): Secondary | ICD-10-CM | POA: Diagnosis not present

## 2018-10-07 DIAGNOSIS — D631 Anemia in chronic kidney disease: Secondary | ICD-10-CM | POA: Diagnosis not present

## 2018-10-10 DIAGNOSIS — I129 Hypertensive chronic kidney disease with stage 1 through stage 4 chronic kidney disease, or unspecified chronic kidney disease: Secondary | ICD-10-CM | POA: Diagnosis not present

## 2018-10-10 DIAGNOSIS — I679 Cerebrovascular disease, unspecified: Secondary | ICD-10-CM | POA: Diagnosis not present

## 2018-10-10 DIAGNOSIS — N183 Chronic kidney disease, stage 3 (moderate): Secondary | ICD-10-CM | POA: Diagnosis not present

## 2018-10-10 DIAGNOSIS — E1122 Type 2 diabetes mellitus with diabetic chronic kidney disease: Secondary | ICD-10-CM | POA: Diagnosis not present

## 2018-10-10 DIAGNOSIS — F0151 Vascular dementia with behavioral disturbance: Secondary | ICD-10-CM | POA: Diagnosis not present

## 2018-10-10 DIAGNOSIS — D631 Anemia in chronic kidney disease: Secondary | ICD-10-CM | POA: Diagnosis not present

## 2018-10-14 DIAGNOSIS — I679 Cerebrovascular disease, unspecified: Secondary | ICD-10-CM | POA: Diagnosis not present

## 2018-10-14 DIAGNOSIS — E1122 Type 2 diabetes mellitus with diabetic chronic kidney disease: Secondary | ICD-10-CM | POA: Diagnosis not present

## 2018-10-14 DIAGNOSIS — F0151 Vascular dementia with behavioral disturbance: Secondary | ICD-10-CM | POA: Diagnosis not present

## 2018-10-14 DIAGNOSIS — N183 Chronic kidney disease, stage 3 (moderate): Secondary | ICD-10-CM | POA: Diagnosis not present

## 2018-10-14 DIAGNOSIS — D631 Anemia in chronic kidney disease: Secondary | ICD-10-CM | POA: Diagnosis not present

## 2018-10-14 DIAGNOSIS — I129 Hypertensive chronic kidney disease with stage 1 through stage 4 chronic kidney disease, or unspecified chronic kidney disease: Secondary | ICD-10-CM | POA: Diagnosis not present

## 2018-10-17 DIAGNOSIS — I129 Hypertensive chronic kidney disease with stage 1 through stage 4 chronic kidney disease, or unspecified chronic kidney disease: Secondary | ICD-10-CM | POA: Diagnosis not present

## 2018-10-17 DIAGNOSIS — E1122 Type 2 diabetes mellitus with diabetic chronic kidney disease: Secondary | ICD-10-CM | POA: Diagnosis not present

## 2018-10-17 DIAGNOSIS — D631 Anemia in chronic kidney disease: Secondary | ICD-10-CM | POA: Diagnosis not present

## 2018-10-17 DIAGNOSIS — I679 Cerebrovascular disease, unspecified: Secondary | ICD-10-CM | POA: Diagnosis not present

## 2018-10-17 DIAGNOSIS — F0151 Vascular dementia with behavioral disturbance: Secondary | ICD-10-CM | POA: Diagnosis not present

## 2018-10-17 DIAGNOSIS — N183 Chronic kidney disease, stage 3 (moderate): Secondary | ICD-10-CM | POA: Diagnosis not present

## 2018-10-21 DIAGNOSIS — N183 Chronic kidney disease, stage 3 (moderate): Secondary | ICD-10-CM | POA: Diagnosis not present

## 2018-10-21 DIAGNOSIS — E1122 Type 2 diabetes mellitus with diabetic chronic kidney disease: Secondary | ICD-10-CM | POA: Diagnosis not present

## 2018-10-21 DIAGNOSIS — I129 Hypertensive chronic kidney disease with stage 1 through stage 4 chronic kidney disease, or unspecified chronic kidney disease: Secondary | ICD-10-CM | POA: Diagnosis not present

## 2018-10-21 DIAGNOSIS — I679 Cerebrovascular disease, unspecified: Secondary | ICD-10-CM | POA: Diagnosis not present

## 2018-10-21 DIAGNOSIS — D631 Anemia in chronic kidney disease: Secondary | ICD-10-CM | POA: Diagnosis not present

## 2018-10-21 DIAGNOSIS — F0151 Vascular dementia with behavioral disturbance: Secondary | ICD-10-CM | POA: Diagnosis not present

## 2018-10-24 DIAGNOSIS — I679 Cerebrovascular disease, unspecified: Secondary | ICD-10-CM | POA: Diagnosis not present

## 2018-10-24 DIAGNOSIS — N183 Chronic kidney disease, stage 3 (moderate): Secondary | ICD-10-CM | POA: Diagnosis not present

## 2018-10-24 DIAGNOSIS — I129 Hypertensive chronic kidney disease with stage 1 through stage 4 chronic kidney disease, or unspecified chronic kidney disease: Secondary | ICD-10-CM | POA: Diagnosis not present

## 2018-10-24 DIAGNOSIS — F0151 Vascular dementia with behavioral disturbance: Secondary | ICD-10-CM | POA: Diagnosis not present

## 2018-10-24 DIAGNOSIS — D631 Anemia in chronic kidney disease: Secondary | ICD-10-CM | POA: Diagnosis not present

## 2018-10-24 DIAGNOSIS — E1122 Type 2 diabetes mellitus with diabetic chronic kidney disease: Secondary | ICD-10-CM | POA: Diagnosis not present

## 2018-10-28 DIAGNOSIS — D631 Anemia in chronic kidney disease: Secondary | ICD-10-CM | POA: Diagnosis not present

## 2018-10-28 DIAGNOSIS — F0151 Vascular dementia with behavioral disturbance: Secondary | ICD-10-CM | POA: Diagnosis not present

## 2018-10-28 DIAGNOSIS — I129 Hypertensive chronic kidney disease with stage 1 through stage 4 chronic kidney disease, or unspecified chronic kidney disease: Secondary | ICD-10-CM | POA: Diagnosis not present

## 2018-10-28 DIAGNOSIS — N183 Chronic kidney disease, stage 3 (moderate): Secondary | ICD-10-CM | POA: Diagnosis not present

## 2018-10-28 DIAGNOSIS — I679 Cerebrovascular disease, unspecified: Secondary | ICD-10-CM | POA: Diagnosis not present

## 2018-10-28 DIAGNOSIS — E1122 Type 2 diabetes mellitus with diabetic chronic kidney disease: Secondary | ICD-10-CM | POA: Diagnosis not present

## 2018-10-29 DIAGNOSIS — I700 Atherosclerosis of aorta: Secondary | ICD-10-CM | POA: Diagnosis not present

## 2018-10-29 DIAGNOSIS — I129 Hypertensive chronic kidney disease with stage 1 through stage 4 chronic kidney disease, or unspecified chronic kidney disease: Secondary | ICD-10-CM | POA: Diagnosis not present

## 2018-10-29 DIAGNOSIS — R159 Full incontinence of feces: Secondary | ICD-10-CM | POA: Diagnosis not present

## 2018-10-29 DIAGNOSIS — R32 Unspecified urinary incontinence: Secondary | ICD-10-CM | POA: Diagnosis not present

## 2018-10-29 DIAGNOSIS — E785 Hyperlipidemia, unspecified: Secondary | ICD-10-CM | POA: Diagnosis not present

## 2018-10-29 DIAGNOSIS — N183 Chronic kidney disease, stage 3 (moderate): Secondary | ICD-10-CM | POA: Diagnosis not present

## 2018-10-29 DIAGNOSIS — E1122 Type 2 diabetes mellitus with diabetic chronic kidney disease: Secondary | ICD-10-CM | POA: Diagnosis not present

## 2018-10-29 DIAGNOSIS — D631 Anemia in chronic kidney disease: Secondary | ICD-10-CM | POA: Diagnosis not present

## 2018-10-29 DIAGNOSIS — Z87440 Personal history of urinary (tract) infections: Secondary | ICD-10-CM | POA: Diagnosis not present

## 2018-10-29 DIAGNOSIS — K219 Gastro-esophageal reflux disease without esophagitis: Secondary | ICD-10-CM | POA: Diagnosis not present

## 2018-10-29 DIAGNOSIS — Z8673 Personal history of transient ischemic attack (TIA), and cerebral infarction without residual deficits: Secondary | ICD-10-CM | POA: Diagnosis not present

## 2018-10-29 DIAGNOSIS — I679 Cerebrovascular disease, unspecified: Secondary | ICD-10-CM | POA: Diagnosis not present

## 2018-10-29 DIAGNOSIS — F0150 Vascular dementia without behavioral disturbance: Secondary | ICD-10-CM | POA: Diagnosis not present

## 2018-10-29 DIAGNOSIS — M170 Bilateral primary osteoarthritis of knee: Secondary | ICD-10-CM | POA: Diagnosis not present

## 2018-10-31 DIAGNOSIS — I129 Hypertensive chronic kidney disease with stage 1 through stage 4 chronic kidney disease, or unspecified chronic kidney disease: Secondary | ICD-10-CM | POA: Diagnosis not present

## 2018-10-31 DIAGNOSIS — D631 Anemia in chronic kidney disease: Secondary | ICD-10-CM | POA: Diagnosis not present

## 2018-10-31 DIAGNOSIS — F0150 Vascular dementia without behavioral disturbance: Secondary | ICD-10-CM | POA: Diagnosis not present

## 2018-10-31 DIAGNOSIS — N183 Chronic kidney disease, stage 3 (moderate): Secondary | ICD-10-CM | POA: Diagnosis not present

## 2018-10-31 DIAGNOSIS — E1122 Type 2 diabetes mellitus with diabetic chronic kidney disease: Secondary | ICD-10-CM | POA: Diagnosis not present

## 2018-10-31 DIAGNOSIS — I679 Cerebrovascular disease, unspecified: Secondary | ICD-10-CM | POA: Diagnosis not present

## 2018-11-07 DIAGNOSIS — D631 Anemia in chronic kidney disease: Secondary | ICD-10-CM | POA: Diagnosis not present

## 2018-11-07 DIAGNOSIS — F0150 Vascular dementia without behavioral disturbance: Secondary | ICD-10-CM | POA: Diagnosis not present

## 2018-11-07 DIAGNOSIS — I679 Cerebrovascular disease, unspecified: Secondary | ICD-10-CM | POA: Diagnosis not present

## 2018-11-07 DIAGNOSIS — N183 Chronic kidney disease, stage 3 (moderate): Secondary | ICD-10-CM | POA: Diagnosis not present

## 2018-11-07 DIAGNOSIS — E1122 Type 2 diabetes mellitus with diabetic chronic kidney disease: Secondary | ICD-10-CM | POA: Diagnosis not present

## 2018-11-07 DIAGNOSIS — I129 Hypertensive chronic kidney disease with stage 1 through stage 4 chronic kidney disease, or unspecified chronic kidney disease: Secondary | ICD-10-CM | POA: Diagnosis not present

## 2018-11-11 DIAGNOSIS — D631 Anemia in chronic kidney disease: Secondary | ICD-10-CM | POA: Diagnosis not present

## 2018-11-11 DIAGNOSIS — I129 Hypertensive chronic kidney disease with stage 1 through stage 4 chronic kidney disease, or unspecified chronic kidney disease: Secondary | ICD-10-CM | POA: Diagnosis not present

## 2018-11-11 DIAGNOSIS — N183 Chronic kidney disease, stage 3 (moderate): Secondary | ICD-10-CM | POA: Diagnosis not present

## 2018-11-11 DIAGNOSIS — F0150 Vascular dementia without behavioral disturbance: Secondary | ICD-10-CM | POA: Diagnosis not present

## 2018-11-11 DIAGNOSIS — E1122 Type 2 diabetes mellitus with diabetic chronic kidney disease: Secondary | ICD-10-CM | POA: Diagnosis not present

## 2018-11-11 DIAGNOSIS — I679 Cerebrovascular disease, unspecified: Secondary | ICD-10-CM | POA: Diagnosis not present

## 2018-11-12 DIAGNOSIS — D631 Anemia in chronic kidney disease: Secondary | ICD-10-CM | POA: Diagnosis not present

## 2018-11-12 DIAGNOSIS — F0150 Vascular dementia without behavioral disturbance: Secondary | ICD-10-CM | POA: Diagnosis not present

## 2018-11-12 DIAGNOSIS — E1122 Type 2 diabetes mellitus with diabetic chronic kidney disease: Secondary | ICD-10-CM | POA: Diagnosis not present

## 2018-11-12 DIAGNOSIS — N183 Chronic kidney disease, stage 3 (moderate): Secondary | ICD-10-CM | POA: Diagnosis not present

## 2018-11-12 DIAGNOSIS — I679 Cerebrovascular disease, unspecified: Secondary | ICD-10-CM | POA: Diagnosis not present

## 2018-11-12 DIAGNOSIS — I129 Hypertensive chronic kidney disease with stage 1 through stage 4 chronic kidney disease, or unspecified chronic kidney disease: Secondary | ICD-10-CM | POA: Diagnosis not present

## 2018-11-14 DIAGNOSIS — E1122 Type 2 diabetes mellitus with diabetic chronic kidney disease: Secondary | ICD-10-CM | POA: Diagnosis not present

## 2018-11-14 DIAGNOSIS — I679 Cerebrovascular disease, unspecified: Secondary | ICD-10-CM | POA: Diagnosis not present

## 2018-11-14 DIAGNOSIS — D631 Anemia in chronic kidney disease: Secondary | ICD-10-CM | POA: Diagnosis not present

## 2018-11-14 DIAGNOSIS — N183 Chronic kidney disease, stage 3 (moderate): Secondary | ICD-10-CM | POA: Diagnosis not present

## 2018-11-14 DIAGNOSIS — F0150 Vascular dementia without behavioral disturbance: Secondary | ICD-10-CM | POA: Diagnosis not present

## 2018-11-14 DIAGNOSIS — I129 Hypertensive chronic kidney disease with stage 1 through stage 4 chronic kidney disease, or unspecified chronic kidney disease: Secondary | ICD-10-CM | POA: Diagnosis not present

## 2018-11-18 DIAGNOSIS — N183 Chronic kidney disease, stage 3 (moderate): Secondary | ICD-10-CM | POA: Diagnosis not present

## 2018-11-18 DIAGNOSIS — D631 Anemia in chronic kidney disease: Secondary | ICD-10-CM | POA: Diagnosis not present

## 2018-11-18 DIAGNOSIS — E1122 Type 2 diabetes mellitus with diabetic chronic kidney disease: Secondary | ICD-10-CM | POA: Diagnosis not present

## 2018-11-18 DIAGNOSIS — I129 Hypertensive chronic kidney disease with stage 1 through stage 4 chronic kidney disease, or unspecified chronic kidney disease: Secondary | ICD-10-CM | POA: Diagnosis not present

## 2018-11-18 DIAGNOSIS — F0150 Vascular dementia without behavioral disturbance: Secondary | ICD-10-CM | POA: Diagnosis not present

## 2018-11-18 DIAGNOSIS — I679 Cerebrovascular disease, unspecified: Secondary | ICD-10-CM | POA: Diagnosis not present

## 2018-11-21 DIAGNOSIS — D631 Anemia in chronic kidney disease: Secondary | ICD-10-CM | POA: Diagnosis not present

## 2018-11-21 DIAGNOSIS — I129 Hypertensive chronic kidney disease with stage 1 through stage 4 chronic kidney disease, or unspecified chronic kidney disease: Secondary | ICD-10-CM | POA: Diagnosis not present

## 2018-11-21 DIAGNOSIS — E1122 Type 2 diabetes mellitus with diabetic chronic kidney disease: Secondary | ICD-10-CM | POA: Diagnosis not present

## 2018-11-21 DIAGNOSIS — F0150 Vascular dementia without behavioral disturbance: Secondary | ICD-10-CM | POA: Diagnosis not present

## 2018-11-21 DIAGNOSIS — I679 Cerebrovascular disease, unspecified: Secondary | ICD-10-CM | POA: Diagnosis not present

## 2018-11-21 DIAGNOSIS — N183 Chronic kidney disease, stage 3 (moderate): Secondary | ICD-10-CM | POA: Diagnosis not present

## 2018-11-26 DIAGNOSIS — I129 Hypertensive chronic kidney disease with stage 1 through stage 4 chronic kidney disease, or unspecified chronic kidney disease: Secondary | ICD-10-CM | POA: Diagnosis not present

## 2018-11-26 DIAGNOSIS — F0150 Vascular dementia without behavioral disturbance: Secondary | ICD-10-CM | POA: Diagnosis not present

## 2018-11-26 DIAGNOSIS — D631 Anemia in chronic kidney disease: Secondary | ICD-10-CM | POA: Diagnosis not present

## 2018-11-26 DIAGNOSIS — E1122 Type 2 diabetes mellitus with diabetic chronic kidney disease: Secondary | ICD-10-CM | POA: Diagnosis not present

## 2018-11-26 DIAGNOSIS — N183 Chronic kidney disease, stage 3 (moderate): Secondary | ICD-10-CM | POA: Diagnosis not present

## 2018-11-26 DIAGNOSIS — I679 Cerebrovascular disease, unspecified: Secondary | ICD-10-CM | POA: Diagnosis not present

## 2018-11-28 DIAGNOSIS — I679 Cerebrovascular disease, unspecified: Secondary | ICD-10-CM | POA: Diagnosis not present

## 2018-11-28 DIAGNOSIS — F0150 Vascular dementia without behavioral disturbance: Secondary | ICD-10-CM | POA: Diagnosis not present

## 2018-11-28 DIAGNOSIS — E1122 Type 2 diabetes mellitus with diabetic chronic kidney disease: Secondary | ICD-10-CM | POA: Diagnosis not present

## 2018-11-28 DIAGNOSIS — Z87440 Personal history of urinary (tract) infections: Secondary | ICD-10-CM | POA: Diagnosis not present

## 2018-11-28 DIAGNOSIS — N183 Chronic kidney disease, stage 3 unspecified: Secondary | ICD-10-CM | POA: Diagnosis not present

## 2018-11-28 DIAGNOSIS — Z8673 Personal history of transient ischemic attack (TIA), and cerebral infarction without residual deficits: Secondary | ICD-10-CM | POA: Diagnosis not present

## 2018-11-28 DIAGNOSIS — I129 Hypertensive chronic kidney disease with stage 1 through stage 4 chronic kidney disease, or unspecified chronic kidney disease: Secondary | ICD-10-CM | POA: Diagnosis not present

## 2018-11-28 DIAGNOSIS — I700 Atherosclerosis of aorta: Secondary | ICD-10-CM | POA: Diagnosis not present

## 2018-11-28 DIAGNOSIS — E785 Hyperlipidemia, unspecified: Secondary | ICD-10-CM | POA: Diagnosis not present

## 2018-11-28 DIAGNOSIS — R32 Unspecified urinary incontinence: Secondary | ICD-10-CM | POA: Diagnosis not present

## 2018-11-28 DIAGNOSIS — M170 Bilateral primary osteoarthritis of knee: Secondary | ICD-10-CM | POA: Diagnosis not present

## 2018-11-28 DIAGNOSIS — D631 Anemia in chronic kidney disease: Secondary | ICD-10-CM | POA: Diagnosis not present

## 2018-11-28 DIAGNOSIS — R159 Full incontinence of feces: Secondary | ICD-10-CM | POA: Diagnosis not present

## 2018-11-28 DIAGNOSIS — K219 Gastro-esophageal reflux disease without esophagitis: Secondary | ICD-10-CM | POA: Diagnosis not present

## 2018-11-29 DIAGNOSIS — D631 Anemia in chronic kidney disease: Secondary | ICD-10-CM | POA: Diagnosis not present

## 2018-11-29 DIAGNOSIS — E1122 Type 2 diabetes mellitus with diabetic chronic kidney disease: Secondary | ICD-10-CM | POA: Diagnosis not present

## 2018-11-29 DIAGNOSIS — F0150 Vascular dementia without behavioral disturbance: Secondary | ICD-10-CM | POA: Diagnosis not present

## 2018-11-29 DIAGNOSIS — N183 Chronic kidney disease, stage 3 unspecified: Secondary | ICD-10-CM | POA: Diagnosis not present

## 2018-11-29 DIAGNOSIS — I129 Hypertensive chronic kidney disease with stage 1 through stage 4 chronic kidney disease, or unspecified chronic kidney disease: Secondary | ICD-10-CM | POA: Diagnosis not present

## 2018-11-29 DIAGNOSIS — I679 Cerebrovascular disease, unspecified: Secondary | ICD-10-CM | POA: Diagnosis not present

## 2018-12-02 DIAGNOSIS — N183 Chronic kidney disease, stage 3 unspecified: Secondary | ICD-10-CM | POA: Diagnosis not present

## 2018-12-02 DIAGNOSIS — D631 Anemia in chronic kidney disease: Secondary | ICD-10-CM | POA: Diagnosis not present

## 2018-12-02 DIAGNOSIS — E1122 Type 2 diabetes mellitus with diabetic chronic kidney disease: Secondary | ICD-10-CM | POA: Diagnosis not present

## 2018-12-02 DIAGNOSIS — I129 Hypertensive chronic kidney disease with stage 1 through stage 4 chronic kidney disease, or unspecified chronic kidney disease: Secondary | ICD-10-CM | POA: Diagnosis not present

## 2018-12-02 DIAGNOSIS — I679 Cerebrovascular disease, unspecified: Secondary | ICD-10-CM | POA: Diagnosis not present

## 2018-12-02 DIAGNOSIS — F0150 Vascular dementia without behavioral disturbance: Secondary | ICD-10-CM | POA: Diagnosis not present

## 2018-12-05 DIAGNOSIS — N183 Chronic kidney disease, stage 3 unspecified: Secondary | ICD-10-CM | POA: Diagnosis not present

## 2018-12-05 DIAGNOSIS — I679 Cerebrovascular disease, unspecified: Secondary | ICD-10-CM | POA: Diagnosis not present

## 2018-12-05 DIAGNOSIS — D631 Anemia in chronic kidney disease: Secondary | ICD-10-CM | POA: Diagnosis not present

## 2018-12-05 DIAGNOSIS — E1122 Type 2 diabetes mellitus with diabetic chronic kidney disease: Secondary | ICD-10-CM | POA: Diagnosis not present

## 2018-12-05 DIAGNOSIS — I129 Hypertensive chronic kidney disease with stage 1 through stage 4 chronic kidney disease, or unspecified chronic kidney disease: Secondary | ICD-10-CM | POA: Diagnosis not present

## 2018-12-05 DIAGNOSIS — F0150 Vascular dementia without behavioral disturbance: Secondary | ICD-10-CM | POA: Diagnosis not present

## 2018-12-06 DIAGNOSIS — Z23 Encounter for immunization: Secondary | ICD-10-CM | POA: Diagnosis not present

## 2018-12-09 DIAGNOSIS — F0150 Vascular dementia without behavioral disturbance: Secondary | ICD-10-CM | POA: Diagnosis not present

## 2018-12-09 DIAGNOSIS — I129 Hypertensive chronic kidney disease with stage 1 through stage 4 chronic kidney disease, or unspecified chronic kidney disease: Secondary | ICD-10-CM | POA: Diagnosis not present

## 2018-12-09 DIAGNOSIS — N183 Chronic kidney disease, stage 3 unspecified: Secondary | ICD-10-CM | POA: Diagnosis not present

## 2018-12-09 DIAGNOSIS — I679 Cerebrovascular disease, unspecified: Secondary | ICD-10-CM | POA: Diagnosis not present

## 2018-12-09 DIAGNOSIS — E1122 Type 2 diabetes mellitus with diabetic chronic kidney disease: Secondary | ICD-10-CM | POA: Diagnosis not present

## 2018-12-09 DIAGNOSIS — D631 Anemia in chronic kidney disease: Secondary | ICD-10-CM | POA: Diagnosis not present

## 2018-12-10 DIAGNOSIS — N183 Chronic kidney disease, stage 3 unspecified: Secondary | ICD-10-CM | POA: Diagnosis not present

## 2018-12-10 DIAGNOSIS — E1122 Type 2 diabetes mellitus with diabetic chronic kidney disease: Secondary | ICD-10-CM | POA: Diagnosis not present

## 2018-12-10 DIAGNOSIS — I129 Hypertensive chronic kidney disease with stage 1 through stage 4 chronic kidney disease, or unspecified chronic kidney disease: Secondary | ICD-10-CM | POA: Diagnosis not present

## 2018-12-10 DIAGNOSIS — D631 Anemia in chronic kidney disease: Secondary | ICD-10-CM | POA: Diagnosis not present

## 2018-12-10 DIAGNOSIS — I679 Cerebrovascular disease, unspecified: Secondary | ICD-10-CM | POA: Diagnosis not present

## 2018-12-10 DIAGNOSIS — F0150 Vascular dementia without behavioral disturbance: Secondary | ICD-10-CM | POA: Diagnosis not present

## 2018-12-12 DIAGNOSIS — I129 Hypertensive chronic kidney disease with stage 1 through stage 4 chronic kidney disease, or unspecified chronic kidney disease: Secondary | ICD-10-CM | POA: Diagnosis not present

## 2018-12-12 DIAGNOSIS — F0150 Vascular dementia without behavioral disturbance: Secondary | ICD-10-CM | POA: Diagnosis not present

## 2018-12-12 DIAGNOSIS — I679 Cerebrovascular disease, unspecified: Secondary | ICD-10-CM | POA: Diagnosis not present

## 2018-12-12 DIAGNOSIS — D631 Anemia in chronic kidney disease: Secondary | ICD-10-CM | POA: Diagnosis not present

## 2018-12-12 DIAGNOSIS — E1122 Type 2 diabetes mellitus with diabetic chronic kidney disease: Secondary | ICD-10-CM | POA: Diagnosis not present

## 2018-12-12 DIAGNOSIS — N183 Chronic kidney disease, stage 3 unspecified: Secondary | ICD-10-CM | POA: Diagnosis not present

## 2018-12-16 DIAGNOSIS — N183 Chronic kidney disease, stage 3 unspecified: Secondary | ICD-10-CM | POA: Diagnosis not present

## 2018-12-16 DIAGNOSIS — F0150 Vascular dementia without behavioral disturbance: Secondary | ICD-10-CM | POA: Diagnosis not present

## 2018-12-16 DIAGNOSIS — I129 Hypertensive chronic kidney disease with stage 1 through stage 4 chronic kidney disease, or unspecified chronic kidney disease: Secondary | ICD-10-CM | POA: Diagnosis not present

## 2018-12-16 DIAGNOSIS — D631 Anemia in chronic kidney disease: Secondary | ICD-10-CM | POA: Diagnosis not present

## 2018-12-16 DIAGNOSIS — E1122 Type 2 diabetes mellitus with diabetic chronic kidney disease: Secondary | ICD-10-CM | POA: Diagnosis not present

## 2018-12-16 DIAGNOSIS — I679 Cerebrovascular disease, unspecified: Secondary | ICD-10-CM | POA: Diagnosis not present

## 2018-12-19 DIAGNOSIS — N183 Chronic kidney disease, stage 3 unspecified: Secondary | ICD-10-CM | POA: Diagnosis not present

## 2018-12-19 DIAGNOSIS — D631 Anemia in chronic kidney disease: Secondary | ICD-10-CM | POA: Diagnosis not present

## 2018-12-19 DIAGNOSIS — I679 Cerebrovascular disease, unspecified: Secondary | ICD-10-CM | POA: Diagnosis not present

## 2018-12-19 DIAGNOSIS — E1122 Type 2 diabetes mellitus with diabetic chronic kidney disease: Secondary | ICD-10-CM | POA: Diagnosis not present

## 2018-12-19 DIAGNOSIS — I129 Hypertensive chronic kidney disease with stage 1 through stage 4 chronic kidney disease, or unspecified chronic kidney disease: Secondary | ICD-10-CM | POA: Diagnosis not present

## 2018-12-19 DIAGNOSIS — F0150 Vascular dementia without behavioral disturbance: Secondary | ICD-10-CM | POA: Diagnosis not present

## 2018-12-23 DIAGNOSIS — N183 Chronic kidney disease, stage 3 unspecified: Secondary | ICD-10-CM | POA: Diagnosis not present

## 2018-12-23 DIAGNOSIS — I679 Cerebrovascular disease, unspecified: Secondary | ICD-10-CM | POA: Diagnosis not present

## 2018-12-23 DIAGNOSIS — D631 Anemia in chronic kidney disease: Secondary | ICD-10-CM | POA: Diagnosis not present

## 2018-12-23 DIAGNOSIS — E1122 Type 2 diabetes mellitus with diabetic chronic kidney disease: Secondary | ICD-10-CM | POA: Diagnosis not present

## 2018-12-23 DIAGNOSIS — I129 Hypertensive chronic kidney disease with stage 1 through stage 4 chronic kidney disease, or unspecified chronic kidney disease: Secondary | ICD-10-CM | POA: Diagnosis not present

## 2018-12-23 DIAGNOSIS — F0150 Vascular dementia without behavioral disturbance: Secondary | ICD-10-CM | POA: Diagnosis not present

## 2018-12-26 DIAGNOSIS — I679 Cerebrovascular disease, unspecified: Secondary | ICD-10-CM | POA: Diagnosis not present

## 2018-12-26 DIAGNOSIS — D631 Anemia in chronic kidney disease: Secondary | ICD-10-CM | POA: Diagnosis not present

## 2018-12-26 DIAGNOSIS — I129 Hypertensive chronic kidney disease with stage 1 through stage 4 chronic kidney disease, or unspecified chronic kidney disease: Secondary | ICD-10-CM | POA: Diagnosis not present

## 2018-12-26 DIAGNOSIS — F0150 Vascular dementia without behavioral disturbance: Secondary | ICD-10-CM | POA: Diagnosis not present

## 2018-12-26 DIAGNOSIS — N183 Chronic kidney disease, stage 3 unspecified: Secondary | ICD-10-CM | POA: Diagnosis not present

## 2018-12-26 DIAGNOSIS — E1122 Type 2 diabetes mellitus with diabetic chronic kidney disease: Secondary | ICD-10-CM | POA: Diagnosis not present

## 2018-12-29 DIAGNOSIS — D631 Anemia in chronic kidney disease: Secondary | ICD-10-CM | POA: Diagnosis not present

## 2018-12-29 DIAGNOSIS — Z8673 Personal history of transient ischemic attack (TIA), and cerebral infarction without residual deficits: Secondary | ICD-10-CM | POA: Diagnosis not present

## 2018-12-29 DIAGNOSIS — R32 Unspecified urinary incontinence: Secondary | ICD-10-CM | POA: Diagnosis not present

## 2018-12-29 DIAGNOSIS — N183 Chronic kidney disease, stage 3 unspecified: Secondary | ICD-10-CM | POA: Diagnosis not present

## 2018-12-29 DIAGNOSIS — R159 Full incontinence of feces: Secondary | ICD-10-CM | POA: Diagnosis not present

## 2018-12-29 DIAGNOSIS — I700 Atherosclerosis of aorta: Secondary | ICD-10-CM | POA: Diagnosis not present

## 2018-12-29 DIAGNOSIS — K219 Gastro-esophageal reflux disease without esophagitis: Secondary | ICD-10-CM | POA: Diagnosis not present

## 2018-12-29 DIAGNOSIS — F0150 Vascular dementia without behavioral disturbance: Secondary | ICD-10-CM | POA: Diagnosis not present

## 2018-12-29 DIAGNOSIS — Z87440 Personal history of urinary (tract) infections: Secondary | ICD-10-CM | POA: Diagnosis not present

## 2018-12-29 DIAGNOSIS — E785 Hyperlipidemia, unspecified: Secondary | ICD-10-CM | POA: Diagnosis not present

## 2018-12-29 DIAGNOSIS — E1122 Type 2 diabetes mellitus with diabetic chronic kidney disease: Secondary | ICD-10-CM | POA: Diagnosis not present

## 2018-12-29 DIAGNOSIS — I129 Hypertensive chronic kidney disease with stage 1 through stage 4 chronic kidney disease, or unspecified chronic kidney disease: Secondary | ICD-10-CM | POA: Diagnosis not present

## 2018-12-29 DIAGNOSIS — I679 Cerebrovascular disease, unspecified: Secondary | ICD-10-CM | POA: Diagnosis not present

## 2018-12-29 DIAGNOSIS — M170 Bilateral primary osteoarthritis of knee: Secondary | ICD-10-CM | POA: Diagnosis not present

## 2018-12-30 DIAGNOSIS — I679 Cerebrovascular disease, unspecified: Secondary | ICD-10-CM | POA: Diagnosis not present

## 2018-12-30 DIAGNOSIS — D631 Anemia in chronic kidney disease: Secondary | ICD-10-CM | POA: Diagnosis not present

## 2018-12-30 DIAGNOSIS — I129 Hypertensive chronic kidney disease with stage 1 through stage 4 chronic kidney disease, or unspecified chronic kidney disease: Secondary | ICD-10-CM | POA: Diagnosis not present

## 2018-12-30 DIAGNOSIS — N183 Chronic kidney disease, stage 3 unspecified: Secondary | ICD-10-CM | POA: Diagnosis not present

## 2018-12-30 DIAGNOSIS — F0150 Vascular dementia without behavioral disturbance: Secondary | ICD-10-CM | POA: Diagnosis not present

## 2018-12-30 DIAGNOSIS — E1122 Type 2 diabetes mellitus with diabetic chronic kidney disease: Secondary | ICD-10-CM | POA: Diagnosis not present

## 2018-12-31 DIAGNOSIS — D631 Anemia in chronic kidney disease: Secondary | ICD-10-CM | POA: Diagnosis not present

## 2018-12-31 DIAGNOSIS — F0150 Vascular dementia without behavioral disturbance: Secondary | ICD-10-CM | POA: Diagnosis not present

## 2018-12-31 DIAGNOSIS — I679 Cerebrovascular disease, unspecified: Secondary | ICD-10-CM | POA: Diagnosis not present

## 2018-12-31 DIAGNOSIS — N183 Chronic kidney disease, stage 3 unspecified: Secondary | ICD-10-CM | POA: Diagnosis not present

## 2018-12-31 DIAGNOSIS — I129 Hypertensive chronic kidney disease with stage 1 through stage 4 chronic kidney disease, or unspecified chronic kidney disease: Secondary | ICD-10-CM | POA: Diagnosis not present

## 2018-12-31 DIAGNOSIS — E1122 Type 2 diabetes mellitus with diabetic chronic kidney disease: Secondary | ICD-10-CM | POA: Diagnosis not present

## 2019-01-02 DIAGNOSIS — I679 Cerebrovascular disease, unspecified: Secondary | ICD-10-CM | POA: Diagnosis not present

## 2019-01-02 DIAGNOSIS — I129 Hypertensive chronic kidney disease with stage 1 through stage 4 chronic kidney disease, or unspecified chronic kidney disease: Secondary | ICD-10-CM | POA: Diagnosis not present

## 2019-01-02 DIAGNOSIS — F0150 Vascular dementia without behavioral disturbance: Secondary | ICD-10-CM | POA: Diagnosis not present

## 2019-01-02 DIAGNOSIS — N183 Chronic kidney disease, stage 3 unspecified: Secondary | ICD-10-CM | POA: Diagnosis not present

## 2019-01-02 DIAGNOSIS — D631 Anemia in chronic kidney disease: Secondary | ICD-10-CM | POA: Diagnosis not present

## 2019-01-02 DIAGNOSIS — E1122 Type 2 diabetes mellitus with diabetic chronic kidney disease: Secondary | ICD-10-CM | POA: Diagnosis not present

## 2019-01-06 DIAGNOSIS — F0150 Vascular dementia without behavioral disturbance: Secondary | ICD-10-CM | POA: Diagnosis not present

## 2019-01-06 DIAGNOSIS — D631 Anemia in chronic kidney disease: Secondary | ICD-10-CM | POA: Diagnosis not present

## 2019-01-06 DIAGNOSIS — I129 Hypertensive chronic kidney disease with stage 1 through stage 4 chronic kidney disease, or unspecified chronic kidney disease: Secondary | ICD-10-CM | POA: Diagnosis not present

## 2019-01-06 DIAGNOSIS — E1122 Type 2 diabetes mellitus with diabetic chronic kidney disease: Secondary | ICD-10-CM | POA: Diagnosis not present

## 2019-01-06 DIAGNOSIS — N183 Chronic kidney disease, stage 3 unspecified: Secondary | ICD-10-CM | POA: Diagnosis not present

## 2019-01-06 DIAGNOSIS — I679 Cerebrovascular disease, unspecified: Secondary | ICD-10-CM | POA: Diagnosis not present

## 2019-01-10 DIAGNOSIS — N183 Chronic kidney disease, stage 3 unspecified: Secondary | ICD-10-CM | POA: Diagnosis not present

## 2019-01-10 DIAGNOSIS — E1122 Type 2 diabetes mellitus with diabetic chronic kidney disease: Secondary | ICD-10-CM | POA: Diagnosis not present

## 2019-01-10 DIAGNOSIS — F0150 Vascular dementia without behavioral disturbance: Secondary | ICD-10-CM | POA: Diagnosis not present

## 2019-01-10 DIAGNOSIS — D631 Anemia in chronic kidney disease: Secondary | ICD-10-CM | POA: Diagnosis not present

## 2019-01-10 DIAGNOSIS — I679 Cerebrovascular disease, unspecified: Secondary | ICD-10-CM | POA: Diagnosis not present

## 2019-01-10 DIAGNOSIS — I129 Hypertensive chronic kidney disease with stage 1 through stage 4 chronic kidney disease, or unspecified chronic kidney disease: Secondary | ICD-10-CM | POA: Diagnosis not present

## 2019-01-13 DIAGNOSIS — I129 Hypertensive chronic kidney disease with stage 1 through stage 4 chronic kidney disease, or unspecified chronic kidney disease: Secondary | ICD-10-CM | POA: Diagnosis not present

## 2019-01-13 DIAGNOSIS — I679 Cerebrovascular disease, unspecified: Secondary | ICD-10-CM | POA: Diagnosis not present

## 2019-01-13 DIAGNOSIS — D631 Anemia in chronic kidney disease: Secondary | ICD-10-CM | POA: Diagnosis not present

## 2019-01-13 DIAGNOSIS — E1122 Type 2 diabetes mellitus with diabetic chronic kidney disease: Secondary | ICD-10-CM | POA: Diagnosis not present

## 2019-01-13 DIAGNOSIS — N183 Chronic kidney disease, stage 3 unspecified: Secondary | ICD-10-CM | POA: Diagnosis not present

## 2019-01-13 DIAGNOSIS — F0150 Vascular dementia without behavioral disturbance: Secondary | ICD-10-CM | POA: Diagnosis not present

## 2019-01-16 DIAGNOSIS — E1122 Type 2 diabetes mellitus with diabetic chronic kidney disease: Secondary | ICD-10-CM | POA: Diagnosis not present

## 2019-01-16 DIAGNOSIS — D631 Anemia in chronic kidney disease: Secondary | ICD-10-CM | POA: Diagnosis not present

## 2019-01-16 DIAGNOSIS — I129 Hypertensive chronic kidney disease with stage 1 through stage 4 chronic kidney disease, or unspecified chronic kidney disease: Secondary | ICD-10-CM | POA: Diagnosis not present

## 2019-01-16 DIAGNOSIS — I679 Cerebrovascular disease, unspecified: Secondary | ICD-10-CM | POA: Diagnosis not present

## 2019-01-16 DIAGNOSIS — F0150 Vascular dementia without behavioral disturbance: Secondary | ICD-10-CM | POA: Diagnosis not present

## 2019-01-16 DIAGNOSIS — N183 Chronic kidney disease, stage 3 unspecified: Secondary | ICD-10-CM | POA: Diagnosis not present

## 2019-01-20 DIAGNOSIS — I679 Cerebrovascular disease, unspecified: Secondary | ICD-10-CM | POA: Diagnosis not present

## 2019-01-20 DIAGNOSIS — N183 Chronic kidney disease, stage 3 unspecified: Secondary | ICD-10-CM | POA: Diagnosis not present

## 2019-01-20 DIAGNOSIS — I129 Hypertensive chronic kidney disease with stage 1 through stage 4 chronic kidney disease, or unspecified chronic kidney disease: Secondary | ICD-10-CM | POA: Diagnosis not present

## 2019-01-20 DIAGNOSIS — D631 Anemia in chronic kidney disease: Secondary | ICD-10-CM | POA: Diagnosis not present

## 2019-01-20 DIAGNOSIS — F0150 Vascular dementia without behavioral disturbance: Secondary | ICD-10-CM | POA: Diagnosis not present

## 2019-01-20 DIAGNOSIS — E1122 Type 2 diabetes mellitus with diabetic chronic kidney disease: Secondary | ICD-10-CM | POA: Diagnosis not present

## 2019-01-24 DIAGNOSIS — D631 Anemia in chronic kidney disease: Secondary | ICD-10-CM | POA: Diagnosis not present

## 2019-01-24 DIAGNOSIS — F0150 Vascular dementia without behavioral disturbance: Secondary | ICD-10-CM | POA: Diagnosis not present

## 2019-01-24 DIAGNOSIS — N183 Chronic kidney disease, stage 3 unspecified: Secondary | ICD-10-CM | POA: Diagnosis not present

## 2019-01-24 DIAGNOSIS — E1122 Type 2 diabetes mellitus with diabetic chronic kidney disease: Secondary | ICD-10-CM | POA: Diagnosis not present

## 2019-01-24 DIAGNOSIS — I679 Cerebrovascular disease, unspecified: Secondary | ICD-10-CM | POA: Diagnosis not present

## 2019-01-24 DIAGNOSIS — I129 Hypertensive chronic kidney disease with stage 1 through stage 4 chronic kidney disease, or unspecified chronic kidney disease: Secondary | ICD-10-CM | POA: Diagnosis not present

## 2019-01-27 DIAGNOSIS — I679 Cerebrovascular disease, unspecified: Secondary | ICD-10-CM | POA: Diagnosis not present

## 2019-01-27 DIAGNOSIS — N183 Chronic kidney disease, stage 3 unspecified: Secondary | ICD-10-CM | POA: Diagnosis not present

## 2019-01-27 DIAGNOSIS — I129 Hypertensive chronic kidney disease with stage 1 through stage 4 chronic kidney disease, or unspecified chronic kidney disease: Secondary | ICD-10-CM | POA: Diagnosis not present

## 2019-01-27 DIAGNOSIS — E1122 Type 2 diabetes mellitus with diabetic chronic kidney disease: Secondary | ICD-10-CM | POA: Diagnosis not present

## 2019-01-27 DIAGNOSIS — D631 Anemia in chronic kidney disease: Secondary | ICD-10-CM | POA: Diagnosis not present

## 2019-01-27 DIAGNOSIS — F0150 Vascular dementia without behavioral disturbance: Secondary | ICD-10-CM | POA: Diagnosis not present

## 2019-02-28 DIAGNOSIS — R32 Unspecified urinary incontinence: Secondary | ICD-10-CM | POA: Diagnosis not present

## 2019-02-28 DIAGNOSIS — E785 Hyperlipidemia, unspecified: Secondary | ICD-10-CM | POA: Diagnosis not present

## 2019-02-28 DIAGNOSIS — N183 Chronic kidney disease, stage 3 unspecified: Secondary | ICD-10-CM | POA: Diagnosis not present

## 2019-02-28 DIAGNOSIS — I129 Hypertensive chronic kidney disease with stage 1 through stage 4 chronic kidney disease, or unspecified chronic kidney disease: Secondary | ICD-10-CM | POA: Diagnosis not present

## 2019-02-28 DIAGNOSIS — E1122 Type 2 diabetes mellitus with diabetic chronic kidney disease: Secondary | ICD-10-CM | POA: Diagnosis not present

## 2019-02-28 DIAGNOSIS — L89112 Pressure ulcer of right upper back, stage 2: Secondary | ICD-10-CM | POA: Diagnosis not present

## 2019-02-28 DIAGNOSIS — I700 Atherosclerosis of aorta: Secondary | ICD-10-CM | POA: Diagnosis not present

## 2019-02-28 DIAGNOSIS — F0150 Vascular dementia without behavioral disturbance: Secondary | ICD-10-CM | POA: Diagnosis not present

## 2019-02-28 DIAGNOSIS — D631 Anemia in chronic kidney disease: Secondary | ICD-10-CM | POA: Diagnosis not present

## 2019-02-28 DIAGNOSIS — M170 Bilateral primary osteoarthritis of knee: Secondary | ICD-10-CM | POA: Diagnosis not present

## 2019-02-28 DIAGNOSIS — Z87440 Personal history of urinary (tract) infections: Secondary | ICD-10-CM | POA: Diagnosis not present

## 2019-02-28 DIAGNOSIS — I679 Cerebrovascular disease, unspecified: Secondary | ICD-10-CM | POA: Diagnosis not present

## 2019-02-28 DIAGNOSIS — R159 Full incontinence of feces: Secondary | ICD-10-CM | POA: Diagnosis not present

## 2019-02-28 DIAGNOSIS — K219 Gastro-esophageal reflux disease without esophagitis: Secondary | ICD-10-CM | POA: Diagnosis not present

## 2019-02-28 DIAGNOSIS — Z8673 Personal history of transient ischemic attack (TIA), and cerebral infarction without residual deficits: Secondary | ICD-10-CM | POA: Diagnosis not present

## 2019-02-28 DIAGNOSIS — L89152 Pressure ulcer of sacral region, stage 2: Secondary | ICD-10-CM | POA: Diagnosis not present

## 2019-02-28 DIAGNOSIS — M6249 Contracture of muscle, multiple sites: Secondary | ICD-10-CM | POA: Diagnosis not present

## 2019-03-03 DIAGNOSIS — N183 Chronic kidney disease, stage 3 unspecified: Secondary | ICD-10-CM | POA: Diagnosis not present

## 2019-03-03 DIAGNOSIS — F0150 Vascular dementia without behavioral disturbance: Secondary | ICD-10-CM | POA: Diagnosis not present

## 2019-03-03 DIAGNOSIS — I679 Cerebrovascular disease, unspecified: Secondary | ICD-10-CM | POA: Diagnosis not present

## 2019-03-03 DIAGNOSIS — I129 Hypertensive chronic kidney disease with stage 1 through stage 4 chronic kidney disease, or unspecified chronic kidney disease: Secondary | ICD-10-CM | POA: Diagnosis not present

## 2019-03-03 DIAGNOSIS — D631 Anemia in chronic kidney disease: Secondary | ICD-10-CM | POA: Diagnosis not present

## 2019-03-03 DIAGNOSIS — E1122 Type 2 diabetes mellitus with diabetic chronic kidney disease: Secondary | ICD-10-CM | POA: Diagnosis not present

## 2019-03-04 DIAGNOSIS — F0150 Vascular dementia without behavioral disturbance: Secondary | ICD-10-CM | POA: Diagnosis not present

## 2019-03-04 DIAGNOSIS — I129 Hypertensive chronic kidney disease with stage 1 through stage 4 chronic kidney disease, or unspecified chronic kidney disease: Secondary | ICD-10-CM | POA: Diagnosis not present

## 2019-03-04 DIAGNOSIS — D631 Anemia in chronic kidney disease: Secondary | ICD-10-CM | POA: Diagnosis not present

## 2019-03-04 DIAGNOSIS — I679 Cerebrovascular disease, unspecified: Secondary | ICD-10-CM | POA: Diagnosis not present

## 2019-03-04 DIAGNOSIS — N183 Chronic kidney disease, stage 3 unspecified: Secondary | ICD-10-CM | POA: Diagnosis not present

## 2019-03-04 DIAGNOSIS — E1122 Type 2 diabetes mellitus with diabetic chronic kidney disease: Secondary | ICD-10-CM | POA: Diagnosis not present

## 2019-03-05 DIAGNOSIS — I129 Hypertensive chronic kidney disease with stage 1 through stage 4 chronic kidney disease, or unspecified chronic kidney disease: Secondary | ICD-10-CM | POA: Diagnosis not present

## 2019-03-05 DIAGNOSIS — E1122 Type 2 diabetes mellitus with diabetic chronic kidney disease: Secondary | ICD-10-CM | POA: Diagnosis not present

## 2019-03-05 DIAGNOSIS — N183 Chronic kidney disease, stage 3 unspecified: Secondary | ICD-10-CM | POA: Diagnosis not present

## 2019-03-05 DIAGNOSIS — D631 Anemia in chronic kidney disease: Secondary | ICD-10-CM | POA: Diagnosis not present

## 2019-03-05 DIAGNOSIS — F0150 Vascular dementia without behavioral disturbance: Secondary | ICD-10-CM | POA: Diagnosis not present

## 2019-03-05 DIAGNOSIS — I679 Cerebrovascular disease, unspecified: Secondary | ICD-10-CM | POA: Diagnosis not present

## 2019-03-06 DIAGNOSIS — E1122 Type 2 diabetes mellitus with diabetic chronic kidney disease: Secondary | ICD-10-CM | POA: Diagnosis not present

## 2019-03-06 DIAGNOSIS — D631 Anemia in chronic kidney disease: Secondary | ICD-10-CM | POA: Diagnosis not present

## 2019-03-06 DIAGNOSIS — I129 Hypertensive chronic kidney disease with stage 1 through stage 4 chronic kidney disease, or unspecified chronic kidney disease: Secondary | ICD-10-CM | POA: Diagnosis not present

## 2019-03-06 DIAGNOSIS — I679 Cerebrovascular disease, unspecified: Secondary | ICD-10-CM | POA: Diagnosis not present

## 2019-03-06 DIAGNOSIS — F0150 Vascular dementia without behavioral disturbance: Secondary | ICD-10-CM | POA: Diagnosis not present

## 2019-03-06 DIAGNOSIS — N183 Chronic kidney disease, stage 3 unspecified: Secondary | ICD-10-CM | POA: Diagnosis not present

## 2019-03-07 DIAGNOSIS — F0150 Vascular dementia without behavioral disturbance: Secondary | ICD-10-CM | POA: Diagnosis not present

## 2019-03-07 DIAGNOSIS — N183 Chronic kidney disease, stage 3 unspecified: Secondary | ICD-10-CM | POA: Diagnosis not present

## 2019-03-07 DIAGNOSIS — D631 Anemia in chronic kidney disease: Secondary | ICD-10-CM | POA: Diagnosis not present

## 2019-03-07 DIAGNOSIS — E1122 Type 2 diabetes mellitus with diabetic chronic kidney disease: Secondary | ICD-10-CM | POA: Diagnosis not present

## 2019-03-07 DIAGNOSIS — I129 Hypertensive chronic kidney disease with stage 1 through stage 4 chronic kidney disease, or unspecified chronic kidney disease: Secondary | ICD-10-CM | POA: Diagnosis not present

## 2019-03-07 DIAGNOSIS — I679 Cerebrovascular disease, unspecified: Secondary | ICD-10-CM | POA: Diagnosis not present

## 2019-03-08 DIAGNOSIS — I129 Hypertensive chronic kidney disease with stage 1 through stage 4 chronic kidney disease, or unspecified chronic kidney disease: Secondary | ICD-10-CM | POA: Diagnosis not present

## 2019-03-08 DIAGNOSIS — D631 Anemia in chronic kidney disease: Secondary | ICD-10-CM | POA: Diagnosis not present

## 2019-03-08 DIAGNOSIS — E1122 Type 2 diabetes mellitus with diabetic chronic kidney disease: Secondary | ICD-10-CM | POA: Diagnosis not present

## 2019-03-08 DIAGNOSIS — I679 Cerebrovascular disease, unspecified: Secondary | ICD-10-CM | POA: Diagnosis not present

## 2019-03-08 DIAGNOSIS — N183 Chronic kidney disease, stage 3 unspecified: Secondary | ICD-10-CM | POA: Diagnosis not present

## 2019-03-08 DIAGNOSIS — F0150 Vascular dementia without behavioral disturbance: Secondary | ICD-10-CM | POA: Diagnosis not present

## 2019-03-31 DEATH — deceased
